# Patient Record
Sex: Female | Born: 1935 | Race: Black or African American | Hispanic: No | Smoking: Current every day smoker
Health system: Southern US, Community
[De-identification: ages and names within clinical notes are randomized; demographics above are authoritative.]

## PROBLEM LIST (undated history)

## (undated) DIAGNOSIS — F419 Anxiety disorder, unspecified: Secondary | ICD-10-CM

## (undated) DIAGNOSIS — E119 Type 2 diabetes mellitus without complications: Secondary | ICD-10-CM

## (undated) DIAGNOSIS — I10 Essential (primary) hypertension: Secondary | ICD-10-CM

## (undated) DIAGNOSIS — F4541 Pain disorder exclusively related to psychological factors: Secondary | ICD-10-CM

## (undated) DIAGNOSIS — I509 Heart failure, unspecified: Secondary | ICD-10-CM

## (undated) DIAGNOSIS — M1990 Unspecified osteoarthritis, unspecified site: Secondary | ICD-10-CM

## (undated) DIAGNOSIS — N189 Chronic kidney disease, unspecified: Secondary | ICD-10-CM

## (undated) DIAGNOSIS — F0390 Unspecified dementia without behavioral disturbance: Secondary | ICD-10-CM

## (undated) HISTORY — DX: Unspecified osteoarthritis, unspecified site: M19.90

## (undated) HISTORY — DX: Pain disorder exclusively related to psychological factors: F45.41

## (undated) HISTORY — DX: Heart failure, unspecified: I50.9

## (undated) HISTORY — DX: Essential (primary) hypertension: I10

## (undated) HISTORY — DX: Anxiety disorder, unspecified: F41.9

## (undated) HISTORY — DX: Type 2 diabetes mellitus without complications: E11.9

## (undated) HISTORY — DX: Chronic kidney disease, unspecified: N18.9

---

## 2004-07-01 ENCOUNTER — Ambulatory Visit: Payer: Self-pay | Admitting: Internal Medicine

## 2005-07-06 ENCOUNTER — Ambulatory Visit: Payer: Self-pay | Admitting: Internal Medicine

## 2006-08-18 ENCOUNTER — Ambulatory Visit: Payer: Self-pay | Admitting: Internal Medicine

## 2006-12-12 ENCOUNTER — Ambulatory Visit: Payer: Self-pay | Admitting: Gastroenterology

## 2007-11-20 ENCOUNTER — Ambulatory Visit: Payer: Self-pay | Admitting: Internal Medicine

## 2008-12-18 ENCOUNTER — Ambulatory Visit: Payer: Self-pay | Admitting: Internal Medicine

## 2009-02-05 ENCOUNTER — Ambulatory Visit: Payer: Self-pay | Admitting: Internal Medicine

## 2010-02-09 ENCOUNTER — Ambulatory Visit: Payer: Self-pay | Admitting: Internal Medicine

## 2010-02-12 ENCOUNTER — Ambulatory Visit: Payer: Self-pay | Admitting: Internal Medicine

## 2011-03-10 ENCOUNTER — Ambulatory Visit: Payer: Self-pay | Admitting: Internal Medicine

## 2012-03-16 ENCOUNTER — Ambulatory Visit: Payer: Self-pay | Admitting: Internal Medicine

## 2013-04-27 ENCOUNTER — Inpatient Hospital Stay: Payer: Self-pay | Admitting: Internal Medicine

## 2013-04-27 LAB — CBC
HCT: 41.3 % (ref 35.0–47.0)
HGB: 12.9 g/dL (ref 12.0–16.0)
MCH: 25.5 pg — ABNORMAL LOW (ref 26.0–34.0)
MCHC: 31.4 g/dL — ABNORMAL LOW (ref 32.0–36.0)
MCV: 81 fL (ref 80–100)
Platelet: 240 10*3/uL (ref 150–440)
RBC: 5.08 10*6/uL (ref 3.80–5.20)
RDW: 16.7 % — ABNORMAL HIGH (ref 11.5–14.5)
WBC: 4.3 10*3/uL (ref 3.6–11.0)

## 2013-04-27 LAB — BASIC METABOLIC PANEL
Anion Gap: 7 (ref 7–16)
BUN: 15 mg/dL (ref 7–18)
CALCIUM: 8.5 mg/dL (ref 8.5–10.1)
Chloride: 106 mmol/L (ref 98–107)
Co2: 26 mmol/L (ref 21–32)
Creatinine: 0.93 mg/dL (ref 0.60–1.30)
EGFR (African American): >60
EGFR (Non-African Amer.): 57 — ABNORMAL LOW
Glucose: 183 mg/dL — ABNORMAL HIGH (ref 65–99)
Osmolality: 283 (ref 275–301)
Potassium: 3.9 mmol/L (ref 3.5–5.1)
Sodium: 139 mmol/L (ref 136–145)

## 2013-04-27 LAB — CK TOTAL AND CKMB (NOT AT ARMC)
CK, TOTAL: 208 U/L — AB
CK, TOTAL: 203 U/L — AB
CK-MB: 5.7 ng/mL — ABNORMAL HIGH (ref 0.5–3.6)
CK-MB: 5.9 ng/mL — ABNORMAL HIGH (ref 0.5–3.6)

## 2013-04-27 LAB — URINALYSIS, COMPLETE
Bacteria: NONE SEEN
Bilirubin,UR: NEGATIVE
Blood: NEGATIVE
Glucose,UR: 50 mg/dL (ref 0–75)
KETONE: NEGATIVE
LEUKOCYTE ESTERASE: NEGATIVE
Nitrite: NEGATIVE
Ph: 6 (ref 4.5–8.0)
RBC,UR: 1 /HPF (ref 0–5)
Specific Gravity: 1.015 (ref 1.003–1.030)
Squamous Epithelial: 1

## 2013-04-27 LAB — TROPONIN I
TROPONIN-I: 0.02 ng/mL
TROPONIN-I: 0.2 ng/mL — AB
Troponin-I: 0.09 ng/mL — ABNORMAL HIGH

## 2013-04-27 LAB — PRO B NATRIURETIC PEPTIDE: B-Type Natriuretic Peptide: 6393 pg/mL — ABNORMAL HIGH (ref 0–450)

## 2013-04-28 LAB — LIPID PANEL
Cholesterol: 139 mg/dL (ref 0–200)
HDL Cholesterol: 52 mg/dL (ref 40–60)
Ldl Cholesterol, Calc: 74 mg/dL (ref 0–100)
TRIGLYCERIDES: 67 mg/dL (ref 0–200)
VLDL CHOLESTEROL, CALC: 13 mg/dL (ref 5–40)

## 2013-04-28 LAB — COMPREHENSIVE METABOLIC PANEL
ALT: 32 U/L (ref 12–78)
Albumin: 2.9 g/dL — ABNORMAL LOW (ref 3.4–5.0)
Alkaline Phosphatase: 95 U/L
Anion Gap: 9 (ref 7–16)
BILIRUBIN TOTAL: 0.6 mg/dL (ref 0.2–1.0)
BUN: 13 mg/dL (ref 7–18)
Calcium, Total: 9 mg/dL (ref 8.5–10.1)
Chloride: 102 mmol/L (ref 98–107)
Co2: 27 mmol/L (ref 21–32)
Creatinine: 1.03 mg/dL (ref 0.60–1.30)
EGFR (African American): 58 — ABNORMAL LOW
EGFR (Non-African Amer.): 50 — ABNORMAL LOW
Glucose: 110 mg/dL — ABNORMAL HIGH (ref 65–99)
Osmolality: 276 (ref 275–301)
Potassium: 3 mmol/L — ABNORMAL LOW (ref 3.5–5.1)
SGOT(AST): 38 U/L — ABNORMAL HIGH (ref 15–37)
Sodium: 138 mmol/L (ref 136–145)
TOTAL PROTEIN: 6.5 g/dL (ref 6.4–8.2)

## 2013-04-28 LAB — CK-MB
CK-MB: 3.1 ng/mL (ref 0.5–3.6)
CK-MB: 2.4 ng/mL (ref 0.5–3.6)

## 2013-04-28 LAB — TROPONIN I: Troponin-I: 0.34 ng/mL — ABNORMAL HIGH

## 2013-04-28 LAB — TSH: THYROID STIMULATING HORM: 3.66 u[IU]/mL

## 2013-04-28 LAB — HEMOGLOBIN A1C: HEMOGLOBIN A1C: 7.1 % — AB (ref 4.2–6.3)

## 2013-04-28 LAB — MAGNESIUM: Magnesium: 1.3 mg/dL — ABNORMAL LOW

## 2013-04-29 LAB — CBC WITH DIFFERENTIAL/PLATELET
BASOS ABS: 0 10*3/uL (ref 0.0–0.1)
Basophil %: 1 %
EOS ABS: 0.1 10*3/uL (ref 0.0–0.7)
EOS PCT: 1.4 %
HCT: 42.3 % (ref 35.0–47.0)
HGB: 13.4 g/dL (ref 12.0–16.0)
LYMPHS ABS: 1.5 10*3/uL (ref 1.0–3.6)
LYMPHS PCT: 32.5 %
MCH: 25.3 pg — ABNORMAL LOW (ref 26.0–34.0)
MCHC: 31.6 g/dL — ABNORMAL LOW (ref 32.0–36.0)
MCV: 80 fL (ref 80–100)
Monocyte #: 0.5 x10 3/mm (ref 0.2–0.9)
Monocyte %: 11.7 %
Neutrophil #: 2.5 10*3/uL (ref 1.4–6.5)
Neutrophil %: 53.4 %
Platelet: 251 10*3/uL (ref 150–440)
RBC: 5.29 10*6/uL — ABNORMAL HIGH (ref 3.80–5.20)
RDW: 16.4 % — ABNORMAL HIGH (ref 11.5–14.5)
WBC: 4.6 10*3/uL (ref 3.6–11.0)

## 2013-04-29 LAB — COMPREHENSIVE METABOLIC PANEL
AST: 36 U/L (ref 15–37)
Albumin: 3.3 g/dL — ABNORMAL LOW (ref 3.4–5.0)
Alkaline Phosphatase: 95 U/L
Anion Gap: 6 — ABNORMAL LOW (ref 7–16)
BUN: 21 mg/dL — ABNORMAL HIGH (ref 7–18)
Bilirubin,Total: 0.6 mg/dL (ref 0.2–1.0)
CHLORIDE: 103 mmol/L (ref 98–107)
CREATININE: 1.25 mg/dL (ref 0.60–1.30)
Calcium, Total: 9.2 mg/dL (ref 8.5–10.1)
Co2: 29 mmol/L (ref 21–32)
EGFR (African American): 46 — ABNORMAL LOW
EGFR (Non-African Amer.): 40 — ABNORMAL LOW
GLUCOSE: 165 mg/dL — AB (ref 65–99)
OSMOLALITY: 282 (ref 275–301)
Potassium: 4.1 mmol/L (ref 3.5–5.1)
SGPT (ALT): 33 U/L (ref 12–78)
SODIUM: 138 mmol/L (ref 136–145)
Total Protein: 7 g/dL (ref 6.4–8.2)

## 2013-04-30 LAB — BASIC METABOLIC PANEL
ANION GAP: 5 — AB (ref 7–16)
BUN: 23 mg/dL — AB (ref 7–18)
CALCIUM: 8.9 mg/dL (ref 8.5–10.1)
CHLORIDE: 104 mmol/L (ref 98–107)
CREATININE: 1.24 mg/dL (ref 0.60–1.30)
Co2: 29 mmol/L (ref 21–32)
EGFR (African American): 47 — ABNORMAL LOW
GFR CALC NON AF AMER: 40 — AB
Glucose: 92 mg/dL (ref 65–99)
Osmolality: 279 (ref 275–301)
POTASSIUM: 4.5 mmol/L (ref 3.5–5.1)
SODIUM: 138 mmol/L (ref 136–145)

## 2013-05-03 LAB — PLATELET COUNT: Platelet: 257 10*3/uL (ref 150–440)

## 2013-11-26 ENCOUNTER — Observation Stay: Payer: Self-pay | Admitting: Internal Medicine

## 2013-11-26 LAB — COMPREHENSIVE METABOLIC PANEL
ALBUMIN: 3.5 g/dL (ref 3.4–5.0)
ALT: 11 U/L — AB
Alkaline Phosphatase: 77 U/L
Anion Gap: 8 (ref 7–16)
BUN: 26 mg/dL — ABNORMAL HIGH (ref 7–18)
Bilirubin,Total: 0.5 mg/dL (ref 0.2–1.0)
CO2: 26 mmol/L (ref 21–32)
Calcium, Total: 9 mg/dL (ref 8.5–10.1)
Chloride: 106 mmol/L (ref 98–107)
Creatinine: 1.22 mg/dL (ref 0.60–1.30)
EGFR (African American): 47 — ABNORMAL LOW
EGFR (Non-African Amer.): 41 — ABNORMAL LOW
Glucose: 110 mg/dL — ABNORMAL HIGH (ref 65–99)
Osmolality: 285 (ref 275–301)
Potassium: 4.1 mmol/L (ref 3.5–5.1)
SGOT(AST): 28 U/L (ref 15–37)
Sodium: 140 mmol/L (ref 136–145)
Total Protein: 7.2 g/dL (ref 6.4–8.2)

## 2013-11-26 LAB — LIPID PANEL
Cholesterol: 102 mg/dL (ref 0–200)
HDL Cholesterol: 47 mg/dL (ref 40–60)
Ldl Cholesterol, Calc: 45 mg/dL (ref 0–100)
Triglycerides: 51 mg/dL (ref 0–200)
VLDL Cholesterol, Calc: 10 mg/dL (ref 5–40)

## 2013-11-26 LAB — TROPONIN I: Troponin-I: <0.02 ng/mL

## 2013-11-26 LAB — URINALYSIS, COMPLETE
Bacteria: NONE SEEN
Bilirubin,UR: NEGATIVE
Blood: NEGATIVE
GLUCOSE, UR: NEGATIVE mg/dL (ref 0–75)
Ketone: NEGATIVE
LEUKOCYTE ESTERASE: NEGATIVE
Nitrite: NEGATIVE
PH: 5 (ref 4.5–8.0)
Protein: 25
RBC,UR: 1 /HPF (ref 0–5)
Specific Gravity: 1.025 (ref 1.003–1.030)
WBC UR: 1 /HPF (ref 0–5)

## 2013-11-26 LAB — CK TOTAL AND CKMB (NOT AT ARMC)
CK, TOTAL: 112 U/L
CK, Total: 141 U/L
CK, Total: 117 U/L
CK-MB: 2.3 ng/mL (ref 0.5–3.6)
CK-MB: 2.1 ng/mL (ref 0.5–3.6)
CK-MB: 2 ng/mL (ref 0.5–3.6)

## 2013-11-26 LAB — CBC
HCT: 38 % (ref 35.0–47.0)
HGB: 11.9 g/dL — AB (ref 12.0–16.0)
MCH: 27.6 pg (ref 26.0–34.0)
MCHC: 31.4 g/dL — ABNORMAL LOW (ref 32.0–36.0)
MCV: 88 fL (ref 80–100)
PLATELETS: 204 10*3/uL (ref 150–440)
RBC: 4.32 10*6/uL (ref 3.80–5.20)
RDW: 14.8 % — ABNORMAL HIGH (ref 11.5–14.5)
WBC: 4.4 10*3/uL (ref 3.6–11.0)

## 2013-12-27 ENCOUNTER — Ambulatory Visit: Payer: Self-pay | Admitting: Neurology

## 2014-06-29 NOTE — Consult Note (Signed)
Chief Complaint:  Subjective/Chief Complaint SOB much improved. Denies cp no syncope no edema.   VITAL SIGNS/ANCILLARY NOTES: **Vital Signs.:   21-Feb-15 11:50  Vital Signs Type Routine  Temperature Temperature (F) 97.8  Celsius 36.5  Temperature Source oral  Pulse Pulse 81  Respirations Respirations 18  Systolic BP Systolic BP 638  Diastolic BP (mmHg) Diastolic BP (mmHg) 81  Mean BP 98  Pulse Ox % Pulse Ox % 95  Pulse Ox Activity Level  At rest  Oxygen Delivery Room Air/ 21 %  *Intake and Output.:   Daily 21-Feb-15 07:00  Grand Totals Intake:  240 Output:  150    Net:  90 24 Hr.:  90  Oral Intake      In:  240  Urine ml     Out:  150  Length of Stay Totals Intake:  240 Output:  150    Net:  90   Brief Assessment:  GEN well developed, well nourished, no acute distress   Cardiac Regular  murmur present  -- LE edema   Respiratory normal resp effort   Gastrointestinal Normal   Gastrointestinal details normal Soft  Nontender  Nondistended   EXTR negative cyanosis/clubbing, negative edema   Lab Results: Thyroid:  21-Feb-15 04:24   Thyroid Stimulating Hormone 3.66 (0.45-4.50 (International Unit)  ----------------------- Pregnant patients have  different reference  ranges for TSH:  - - - - - - - - - -  Pregnant, first trimetser:  0.36 - 2.50 uIU/mL)  Hepatic:  21-Feb-15 04:24   Bilirubin, Total 0.6  Alkaline Phosphatase 95 (45-117 NOTE: New Reference Range 01/26/13)  SGPT (ALT) 32  SGOT (AST)  38  Total Protein, Serum 6.5  Albumin, Serum  2.9  Routine Chem:  21-Feb-15 04:24   Magnesium, Serum  1.3 (1.8-2.4 THERAPEUTIC RANGE: 4-7 mg/dL TOXIC: > 10 mg/dL  -----------------------)  Result Comment TROPONIN - RESULTS VERIFIED BY REPEAT TESTING.  - RESULT CALLED PREVIOUSLY 04/27/13 AT  - 1433 BY QSD-DAS  Result(s) reported on 28 Apr 2013 at 07:45AM.  Glucose, Serum  110  BUN 13  Creatinine (comp) 1.03  Sodium, Serum 138  Potassium, Serum  3.0   Chloride, Serum 102  CO2, Serum 27  Calcium (Total), Serum 9.0  Osmolality (calc) 276  eGFR (African American)  58  eGFR (Non-African American)  50 (eGFR values <55m/min/1.73 m2 may be an indication of chronic kidney disease (CKD). Calculated eGFR is useful in patients with stable renal function. The eGFR calculation will not be reliable in acutely ill patients when serum creatinine is changing rapidly. It is not useful in  patients on dialysis. The eGFR calculation may not be applicable to patients at the low and high extremes of body sizes, pregnant women, and vegetarians.)  Anion Gap 9  Hemoglobin A1c ([facility])  7.1 (The American Diabetes Association recommends that a primary goal of therapy should be <7% and that physicians should reevaluate the treatment regimen in patients with HbA1c values consistently >8%.)  Cholesterol, Serum 139  Triglycerides, Serum 67  HDL (INHOUSE) 52  VLDL Cholesterol Calculated 13  LDL Cholesterol Calculated 74 (Result(s) reported on 28 Apr 2013 at 07:20AM.)  Cardiac:  21-Feb-15 04:24   CPK-MB, Serum 3.1 (Result(s) reported on 28 Apr 2013 at 08:21AM.)  Troponin I  0.34 (0.00-0.05 0.05 ng/mL or less: NEGATIVE  Repeat testing in 3-6 hrs  if clinically indicated. >0.05 ng/mL: POTENTIAL  MYOCARDIAL INJURY. Repeat  testing in 3-6 hrs if  clinically indicated. NOTE: An increase or  decrease  of 30% or more on serial  testing suggests a  clinically important change)    12:10   CPK-MB, Serum 2.4 (Result(s) reported on 28 Apr 2013 at 01:12PM.)   Radiology Results: XRay:    20-Feb-15 10:07, Chest PA and Lateral  Chest PA and Lateral   REASON FOR EXAM:    shob  COMMENTS:       PROCEDURE: DXR - DXR CHEST PA (OR AP) AND LATERAL  - Apr 27 2013 10:07AM     CLINICAL DATA:  Short of breath.  Smoking history.    EXAM:  CHEST  2 VIEW    COMPARISON:  None.    FINDINGS:  The cardiac silhouette is enlarged consistent with cardiomegaly  and/or  pericardial fluid. The aorta shows calcification and  unfolding. There is pulmonary venous hypertension with interstitial  pulmonary edema. There small pleural effusions with mild basilar  volume loss. No focal pulmonary process. Ordinary degenerative  changes affect the spine.     IMPRESSION:  Congestive heart failure as outlined above.      Electronically Signed    By: Nelson Chimes M.D.    On: 04/27/2013 10:09         Verified By: Jules Schick, M.D.,  Cardiology:    20-Feb-15 14:15, Echo Doppler  Echo Doppler   REASON FOR EXAM:      COMMENTS:       PROCEDURE: [facility] - ECHO DOPPLER COMPLETE(TRANSTHOR)  - Apr 27 2013  2:15PM     RESULT: Echocardiogram Report    Patient Name:   Jamie Glass Date of Exam: 04/27/2013  Medical Rec #:  [id number]           Custom1:  Date of Birth:  May 23, 1927         Height:       66.0 in  Patient Age:    81 years         Weight:       128.0 lb  Patient Gender: F                BSA:          1.65 m??    Indications: CHF  Sonographer:    Sherrie Sport RDCS  Referring Phys: MODY, SITAL, P    Sonographer Comments: [facility]    Summary:   1. Left ventricular ejection fraction, by visual estimation, is 40 to   45%.   2. Mildly to moderately decreased global left ventricular systolic   function.   3. Severe tricuspid regurgitation.   4. Moderate mitral valve regurgitation.   5. Mildly elevated pulmonary artery systolic pressure.   6. Severely increased left ventricular posterior wall thickness.   7. Mod/Severe ant/apical hypo.  2D AND M-MODE MEASUREMENTS (normal ranges within parentheses):  Left Ventricle:          Normal  IVSd (2D):      1.22 cm (0.7-1.1)  LVPWd (2D):     1.51 cm (0.7-1.1) Aorta/LA:                  Normal  LVIDd (2D):     4.34 cm (3.4-5.7) Aortic Root (2D): 2.40 cm (2.4-3.7)  LVIDs (2D):     3.36 cm           Left Atrium (2D): 3.60 cm (1.9-4.0)  LV FS (2D):     22.6 %   (>25%)  LV EF (2D):     45.7 %   (>  50%)                                     Right Ventricle:                                    RVd (2D):        0.76 cm  LV SYSTOLIC FUNCTION BY 2D PLANIMETRY (MOD):  EF-A4C View: 38.9 % EF-A2C View: 45.4 % EF-Biplane: 80.8 %  LV DIASTOLIC FUNCTION:  MV Peak E: 1.20 m/s E/e' Ratio: 29.30  MV Peak A: 1.21 m/s Decel Time: 187 msec  E/A Ratio: 0.99  SPECTRAL DOPPLER ANALYSIS (where applicable):  Mitral Valve:  MV P1/2 Time: 54.23 msec  MV Area, PHT: 4.06 cm??  Aortic Valve: AoV Max Vel: 1.57 m/s AoV Peak PG: 9.9 mmHg AoV Mean PG:  LVOT Vmax: 1.07 m/s LVOT VTI:  LVOT Diameter: 2.00 cm  AoV Area, Vmax: 2.14 cm?? AoV Area, VTI:  AoV Area, Vmn:  Tricuspid Valve and PA/RV Systolic Pressure: TR Max Velocity: 3.18 m/s RA   Pressure: 5 mmHg RVSP/PASP: 45.5 mmHg  Pulmonic Valve:  PV Max Velocity: 0.92 m/s PV Max PG: 3.4 mmHg PV Mean PG:  Pulmonic Insufficiency:  PI EDV: 1.21 m/s PADP: 10.9 mmHg    PHYSICIAN INTERPRETATION:  Left Ventricle: The left ventricular internal cavity size was normal. LV   septal wall thickness was normal. LV posterior wall thickness was   severely increased. Global LV systolic function was mildly to moderately   decreased. Left ventricularejection fraction, by visual estimation, is   40 to 45%.  Right Ventricle: The right ventricular size is mildly enlarged. Global RV   systolic function is normal.  Left Atrium: The left atrium is normal in size.  Right Atrium: The right atrium is normal in size.  Pericardium: There is no evidence of pericardial effusion.  Mitral Valve: The mitral valve is normal in structure. Moderate mitral   valve regurgitation is seen.  Tricuspid Valve: The tricuspid valve is normal. Severe tricuspid   regurgitation is visualized. The tricuspid regurgitant velocity is 3.18   m/s, and with an assumed right atrial pressure of 5 mmHg, the estimated     right ventricular systolic pressure is mildly elevated at 45.5 mmHg.  Aortic Valve: The aortic valve is normal.  Pulmonic  Valve: The pulmonic valve is normal.    [facility] MD  Electronically signed by Volente MD  Signature Date/Time: 04/27/2013/3:21:39 PM    *** Final ***    IMPRESSION: .        Verified By: Yolonda Kida, M.D., MD   Assessment/Plan:  Assessment/Plan:  Assessment IMP SOB CHF CM HTN Hyperlipidemia Probable CAD .   Plan PLAN Tele 02 IV Lasix bid ACE for bp/cm/chf B-blockers Statin for lipids Increase activity Hopefully d/c home soon Consider cath possibly inpt/outpt?   Electronic Signatures: Lujean Amel D (MD)  (Signed 21-Feb-15 23:56)  Authored: Chief Complaint, VITAL SIGNS/ANCILLARY NOTES, Brief Assessment, Lab Results, Radiology Results, Assessment/Plan   Last Updated: 21-Feb-15 23:56 by Lujean Amel D (MD)

## 2014-06-29 NOTE — Consult Note (Signed)
Brief Consult Note: Diagnosis: Cognitive disorder NOS.   Patient was seen by consultant.   Consult note dictated.   Recommend further assessment or treatment.   Orders entered.   Comments: [name] has no psychiatric past. She has not been compliant with treatment at home and comes for exacerbation of CAD and accelerated HTN. She has been agitated at night and code 300 was called for the previous 2 nights. She is being referred to Geriatric psychiatry unit.   She was given Zyprexa zydis last night. She slept through the night and is somnolent during the day. She wakes up for meals. She is still pleasantly confused but interacted appropriately with her family today. She has no complaints and feels "good". Sitter at bedside.     MSE: Somnolent but arousable. Continues to be confused. No behavioral problems. She maintains some eye contect. Speech is soft. Mood is fine. Thought process is slow. She is not suicidal or homicidal. There are paranoid delusions. No evidence of hallucinations. Cognition impaired. Unable to participate in cognitive part of exam. Insight and judgment extremely poor.   PLAN: 1. The patient is on IVC.   2. She does not have the capacity to make decisions about disposition.   3. She is referred to geropsychiatry unit.   4. We continue Zyuprexa zydis 5 mg at night and as needed for agitation.   5. I will follow along.  Electronic Signatures: Kristine LineaPucilowska, Cherrill Scrima (MD)  (Signed 25-Feb-15 22:06)  Authored: Brief Consult Note   Last Updated: 25-Feb-15 22:06 by Kristine LineaPucilowska, Kourtland Coopman (MD)

## 2014-06-29 NOTE — Discharge Summary (Signed)
PATIENT NAME:  Jamie Glass, Mida K MR#:  [id number] DATE OF BIRTH:  [birth year]  DATE OF ADMISSION:  11/26/2013 DATE OF DISCHARGE:    FINAL DIAGNOSES:  1. Accelerated hypertension.  2.  Decreased grip left hand.  3.  Dementia with behavioral disturbance.    4.  Diabetes mellitus.  5.  History of chronic left-sided systolic congestive heart failure.  6.  Medical noncompliance.   HISTORY AND PHYSICAL: Please see dictated admission history and physical.   HOSPITAL COURSE: The patient was admitted with accelerated hypertension and question of new onset of weakness in the left arm. Very difficult to verify the timing of this due to the patient's dementia. The family members are not able to tell us either. It did not appear to involve the face or leg. CT scan was performed which was negative. MRI of the brain was performed which revealed no acute event. Carotid Dopplers were performed which did not reveal occlusion.   Lopressor was introduced, and the patient was continued on home medications and her blood pressure was markedly improved with this combination. She was up walking in the hallways spontaneously on multiple times, and family members were concerned that she appeared to be getting more irritated and agitated with being in the hospital. They desired to take her home. We talked about setting up home health physical therapy or nursing, however they feel like she is doing well enough at home that these things would not be necessary and that they might actually make her feel more agitated and so they declined these modalities. At this point we will anticipate her returning home with her physical activity to be up as tolerated. She should check blood sugars daily and record these. Encouraged her to check blood pressures as well if able, and we will have her follow up with her primary care physician during the next 1-2 weeks and have them follow up with neurology in the next 2-3 weeks as well. ACE  inhibitors and ARBs will not be ordered due to the patient's history of worsening renal function when on these via a family member's report. She should weigh herself daily and call for more than 2 pound gain in 1 day or 5 pounds in 1 week or increasing signs or symptoms of heart failure or any new neurologic deficits. At this point we will have her follow a regular diet given her overall decreased nutritional status although I have asked them to limit salt as able.   DISCHARGE MEDICATIONS:  1.  Imdur 30 mg p.o. daily.  2.  Pravastatin 20 mg p.o. at bedtime.   3. Aspirin 81 mg p.o. daily.  4.  Olanzapine 5 mg p.o. at bedtime.  5.  Glipizide XL 2.5 mg p.o. q.a.m.  6.  Lopressor 25 mg p.o. b.i.d.   CODE STATUS:  The patient is full code.     ____________________________ Lynnea FerrierBert J. Orie Cuttino III, MD bjk:[id number] D: 11/27/2013 13:03:00 ET T: 11/27/2013 13:19:38 ET JOB#: [id number]  cc: Lynnea FerrierBert J. Tashaun Obey III, MD, <Dictator> Daniel NonesBERT Zubayr Bednarczyk MD ELECTRONICALLY SIGNED 12/04/2013 7:20

## 2014-06-29 NOTE — H&P (Signed)
PATIENT NAME:  Jamie Glass, Jamie Glass MR#:  [id number] DATE OF BIRTH:  [birth year]  DATE OF ADMISSION:  04/27/2013  PRIMARY CARE PHYSICIAN: Yates DecampJohn Walker III, MD  CHIEF COMPLAINT: Short of breath.   HISTORY OF PRESENT ILLNESS: This is an 81 year old female with a history of hypertension, hyperlipidemia, and diabetes who presents with the above complaint. Over the past few weeks, the patient has had increasing shortness of breath. Her daughter noted today that she was very short of breath and had dyspnea on exertion so she brought her here for further evaluation. In the ER, chest x-ray was performed which shows pulmonary edema. She does have an elevated BNP. She says her shortness of breath and dyspnea on exertion have been persistent over the past few weeks, but it did get worse today. She denies any lower extremity edema, chest pain, nausea and vomiting. She had a headache this morning and some blurred vision. No polyphagia or polyuria. She has not been taking any of her medications for several months now.   REVIEW OF SYSTEMS: CONSTITUTIONAL: No fever. Positive fatigue and weakness. She has fallen a few times over the past few days. EYES: Positive blurred vision. No double vision, pain, or redness. ENT: No ear pain, hearing loss, seasonal allergies, postnasal drip, or sinus pain. RESPIRATORY: No cough, wheezing, or hemoptysis. Positive dyspnea. No history of COPD. CARDIOVASCULAR: No chest pain, palpitations, orthopnea. No edema. No arrhythmia. Positive dyspnea on exertion.  GASTROINTESTINAL: No nausea, vomiting, diarrhea, abdominal pain, melena, or ulcers. GENITOURINARY: No dysuria or hematuria.  ENDOCRINE: No polyuria or polydipsia.  HEME AND LYMPH: No anemia or easy bruising. SKIN: No rash or lesions.  MUSCULOSKELETAL: No pain in the shoulders, joints, or knees. She has fallen a few times. NEUROLOGIC: No history of CVA, TIA, or seizures. PSYCHIATRIC: No history of anxiety or depression.  PAST  MEDICAL HISTORY:  1.  Hypertensive heart disease.  2.  Osteoarthritis. 3.  Diabetes.  4.  Hyperlipidemia. 5.  Proteinuria. 6.  Tricuspid regurgitation.  7.  Colonic polyps.   PAST SURGICAL HISTORY:  1.  Lumbar laminectomy. 2.  Complete hysterectomy.  MEDICATIONS: The patient is not taking any medications.   ALLERGIES: SULFA CAUSES A RASH.   FAMILY HISTORY: Positive for diabetes.   SOCIAL HISTORY: No tobacco, alcohol, or drug use. She was a former smoker, quit several years ago.   PHYSICAL EXAMINATION: VITAL SIGNS: Temperature 97.9, pulse 90, respirations 18, blood pressure 214/115 initially and now is 186/97, and 97% on room air.  GENERAL: The patient is alert, oriented. She is in some distress if she lies flat.  HEENT: Head is atraumatic. Pupils are round and reactive. Sclerae anicteric. Mucous membranes are dry. Oropharynx is clear.  NECK: Supple. There is JVD up to the level of the jaw. No enlarged thyroid.  CARDIOVASCULAR: Regular rate and rhythm. No murmurs, gallops, or rubs. PMI is laterally displaced.  LUNGS: Diffuse crackles about one-third of the way bilaterally and symmetrically. No dullness to percussion. Normal chest expansion. No rales or rhonchi are heard.  ABDOMEN: Bowel sounds are positive, nontender, nondistended. No hepatosplenomegaly. No rebound or guarding.  EXTREMITIES: No clubbing, cyanosis or edema.  NEUROLOGIC: Cranial nerves II through XII are intact. There are no focal deficits.  SKIN: Without rash or lesions.  BACK: No CVA or vertebral tenderness.   DIAGNOSTIC DATA: Laboratory: White blood cells 4.3, hemoglobin 13, hematocrit 42, platelets 240. Sodium 139, potassium 3.9, chloride 106, bicarb 26, BUN 15, creatinine 0.93, glucose 183, calcium 8.5, anion  gap 7. BNP [id number].   Troponin 0.02. Urinalysis shows no LCE or nitrites.   Chest x-ray shows bilateral pulmonary edema.   EKG showed normal sinus rhythm with left atrial enlargement and LVH.     ASSESSMENT AND PLAN: This is an 81 year old female with a history of diabetes, hypertension, hyperlipidemia, and not taking any medications for several weeks who initially presented with shortness of breath and malignant hypertension.  1.  Acute congestive heart failure. The patient was noted to have pulmonary edema on chest x-ray, elevated BNP, crackles on examination, as well JVD, all consistent with acute congestive heart failure, possibly diastolic or systolic in nature. I will continue Lasix diuresis 40 IV q. 8 hours, follow I's and O's and daily weights. Cardiology has been consulted. Echocardiogram has been ordered along with a TSH.  2.  Malignant hypertension. The patient does have baseline hypertension, not taking any medications. I suspect this is a combination of underlying hypertension as well as congestive heart failure. Her blood pressure has improved. We will continue with hydralazine p.r.n. and nitroglycerin. I have started metoprolol 50 b.i.d. in addition to lisinopril 20 mg b.i.d. Her blood pressure will need to be monitored carefully as this could also be the etiology of her acute congestive heart failure.  3.  Diabetes. For now put the patient on sliding scale insulin. It is noted that she was on metformin and p.o. glitazone as an outpatient several months ago which we may want to restart prior to discharge. I have ordered a Hemoglobin A1c. 4.  Hyperlipidemia. The patient was on Pravastatin in the past. I have ordered lipid panel and will continue with pravastatin 20 mg at bedtime.  5.  Falls. The patient has had a couple of falls just due to balance issues. She does not have any focal neurological deficits on exam. We will go ahead and consult physical therapy as well as case management for her disposition planning.   CODE STATUS: DNR.   TIME SPENT: Approximately 45 minutes.    ____________________________ Janyth Contes. Juliene Pina, MD spm:sb D: 04/27/2013 11:12:49 ET T: 04/27/2013  14:48:01 ET JOB#: [id number]  cc: Vincente Asbridge P. Juliene Pina, MD, <Dictator> John B. Danne Harbor, MD Tashara Suder P Latiesha Harada MD ELECTRONICALLY SIGNED 04/27/2013 16:30

## 2014-06-29 NOTE — Consult Note (Signed)
Chief Complaint:  Subjective/Chief Complaint Dementia  . Ambulating well with PT.SOB much improved. Denies cp no syncope no edema.   VITAL SIGNS/ANCILLARY NOTES: **Vital Signs.:   22-Feb-15 11:56  Vital Signs Type Routine  Temperature Temperature (F) 97.7  Celsius 36.5  Temperature Source oral  Pulse Pulse 73  Respirations Respirations 18  Systolic BP Systolic BP 956  Diastolic BP (mmHg) Diastolic BP (mmHg) 79  Mean BP 98  Pulse Ox % Pulse Ox % 98  Pulse Ox Activity Level  At rest  Oxygen Delivery Room Air/ 21 %  *Intake and Output.:   22-Feb-15 11:58  Grand Totals Intake:  240 Output:      Net:  240 24 Hr.:  480  Oral Intake      In:  240  Percentage of Meal Eaten  75   Brief Assessment:  GEN well developed, well nourished, no acute distress   Cardiac Regular  murmur present  -- LE edema   Respiratory normal resp effort   Gastrointestinal Normal   Gastrointestinal details normal Soft  Nontender  Nondistended   EXTR negative cyanosis/clubbing, negative edema   Lab Results: Thyroid:  21-Feb-15 04:24   Thyroid Stimulating Hormone 3.66 (0.45-4.50 (International Unit)  ----------------------- Pregnant patients have  different reference  ranges for TSH:  - - - - - - - - - -  Pregnant, first trimetser:  0.36 - 2.50 uIU/mL)  Hepatic:  21-Feb-15 04:24   Bilirubin, Total 0.6  Alkaline Phosphatase 95 (45-117 NOTE: New Reference Range 01/26/13)  SGPT (ALT) 32  SGOT (AST)  38  Total Protein, Serum 6.5  Albumin, Serum  2.9  Routine Chem:  21-Feb-15 04:24   Glucose, Serum  110  BUN 13  Creatinine (comp) 1.03  Potassium, Serum  3.0  Chloride, Serum 102  CO2, Serum 27  Calcium (Total), Serum 9.0  Osmolality (calc) 276  eGFR (African American)  58  eGFR (Non-African American)  50 (eGFR values <44m/min/1.73 m2 may be an indication of chronic kidney disease (CKD). Calculated eGFR is useful in patients with stable renal function. The eGFR calculation will  not be reliable in acutely ill patients when serum creatinine is changing rapidly. It is not useful in  patients on dialysis. The eGFR calculation may not be applicable to patients at the low and high extremes of body sizes, pregnant women, and vegetarians.)  Anion Gap 9  Magnesium, Serum  1.3 (1.8-2.4 THERAPEUTIC RANGE: 4-7 mg/dL TOXIC: > 10 mg/dL  -----------------------)  Result Comment TROPONIN - RESULTS VERIFIED BY REPEAT TESTING.  - RESULT CALLED PREVIOUSLY 04/27/13 AT  - 1433 BY QSD-DAS  Result(s) reported on 28 Apr 2013 at 07:45AM.  Hemoglobin A1c ([facility])  7.1 (The American Diabetes Association recommends that a primary goal of therapy should be <7% and that physicians should reevaluate the treatment regimen in patients with HbA1c values consistently >8%.)  Cholesterol, Serum 139  Triglycerides, Serum 67  HDL (INHOUSE) 52  VLDL Cholesterol Calculated 13  LDL Cholesterol Calculated 74 (Result(s) reported on 28 Apr 2013 at 07:20AM.)  Cardiac:  21-Feb-15 04:24   CPK-MB, Serum 3.1 (Result(s) reported on 28 Apr 2013 at 08:21AM.)  Troponin I  0.34 (0.00-0.05 0.05 ng/mL or less: NEGATIVE  Repeat testing in 3-6 hrs  if clinically indicated. >0.05 ng/mL: POTENTIAL  MYOCARDIAL INJURY. Repeat  testing in 3-6 hrs if  clinically indicated. NOTE: An increase or decrease  of 30% or more on serial  testing suggests a  clinically important change)  12:10   CPK-MB, Serum 2.4 (Result(s) reported on 28 Apr 2013 at 01:12PM.)   Radiology Results: XRay:    20-Feb-15 10:07, Chest PA and Lateral  Chest PA and Lateral   REASON FOR EXAM:    shob  COMMENTS:       PROCEDURE: DXR - DXR CHEST PA (OR AP) AND LATERAL  - Apr 27 2013 10:07AM     CLINICAL DATA:  Short of breath.  Smoking history.    EXAM:  CHEST  2 VIEW    COMPARISON:  None.    FINDINGS:  The cardiac silhouette is enlarged consistent with cardiomegaly  and/or pericardial fluid. The aorta shows calcification  and  unfolding. There is pulmonary venous hypertension with interstitial  pulmonary edema. There small pleural effusions with mild basilar  volume loss. No focal pulmonary process. Ordinary degenerative  changes affect the spine.     IMPRESSION:  Congestive heart failure as outlined above.      Electronically Signed    By: Nelson Chimes M.D.    On: 04/27/2013 10:09         Verified By: Jules Schick, M.D.,  Cardiology:    20-Feb-15 14:15, Echo Doppler  Echo Doppler   REASON FOR EXAM:      COMMENTS:       PROCEDURE: [facility] - ECHO DOPPLER COMPLETE(TRANSTHOR)  - Apr 27 2013  2:15PM     RESULT: Echocardiogram Report    Patient Name:   Jamie Glass Date of Exam: 04/27/2013  Medical Rec #:  [id number]           Custom1:  Date of Birth:  1928-01-31         Height:       66.0 in  Patient Age:    81 years         Weight:       128.0 lb  Patient Gender: F                BSA:          1.65 m??    Indications: CHF  Sonographer:    Sherrie Sport RDCS  Referring Phys: MODY, SITAL, P    Sonographer Comments: [facility]    Summary:   1. Left ventricular ejection fraction, by visual estimation, is 40 to   45%.   2. Mildly to moderately decreased global left ventricular systolic   function.   3. Severe tricuspid regurgitation.   4. Moderate mitral valve regurgitation.   5. Mildly elevated pulmonary artery systolic pressure.   6. Severely increased left ventricular posterior wall thickness.   7. Mod/Severe ant/apical hypo.  2D AND M-MODE MEASUREMENTS (normal ranges within parentheses):  Left Ventricle:          Normal  IVSd (2D):      1.22 cm (0.7-1.1)  LVPWd (2D):     1.51 cm (0.7-1.1) Aorta/LA:                  Normal  LVIDd (2D):     4.34 cm (3.4-5.7) Aortic Root (2D): 2.40 cm (2.4-3.7)  LVIDs (2D):     3.36 cm           Left Atrium (2D): 3.60 cm (1.9-4.0)  LV FS (2D):     22.6 %   (>25%)  LV EF (2D):     45.7 %   (>50%)  Right Ventricle:                                     RVd (2D):        7.12 cm  LV SYSTOLIC FUNCTION BY 2D PLANIMETRY (MOD):  EF-A4C View: 38.9 % EF-A2C View: 45.4 % EF-Biplane: 19.7 %  LV DIASTOLIC FUNCTION:  MV Peak E: 1.20 m/s E/e' Ratio: 29.30  MV Peak A: 1.21 m/s Decel Time: 187 msec  E/A Ratio: 0.99  SPECTRAL DOPPLER ANALYSIS (where applicable):  Mitral Valve:  MV P1/2 Time: 54.23 msec  MV Area, PHT: 4.06 cm??  Aortic Valve: AoV Max Vel: 1.57 m/s AoV Peak PG: 9.9 mmHg AoV Mean PG:  LVOT Vmax: 1.07 m/s LVOT VTI:  LVOT Diameter: 2.00 cm  AoV Area, Vmax: 2.14 cm?? AoV Area, VTI:  AoV Area, Vmn:  Tricuspid Valve and PA/RV Systolic Pressure: TR Max Velocity: 3.18 m/s RA   Pressure: 5 mmHg RVSP/PASP: 45.5 mmHg  Pulmonic Valve:  PV Max Velocity: 0.92 m/s PV Max PG: 3.4 mmHg PV Mean PG:  Pulmonic Insufficiency:  PI EDV: 1.21 m/s PADP: 10.9 mmHg    PHYSICIAN INTERPRETATION:  Left Ventricle: The left ventricular internal cavity size was normal. LV   septal wall thickness was normal. LV posterior wall thickness was   severely increased. Global LV systolic function was mildly to moderately   decreased. Left ventricularejection fraction, by visual estimation, is   40 to 45%.  Right Ventricle: The right ventricular size is mildly enlarged. Global RV   systolic function is normal.  Left Atrium: The left atrium is normal in size.  Right Atrium: The right atrium is normal in size.  Pericardium: There is no evidence of pericardial effusion.  Mitral Valve: The mitral valve is normal in structure. Moderate mitral   valve regurgitation is seen.  Tricuspid Valve: The tricuspid valve is normal. Severe tricuspid   regurgitation is visualized. The tricuspid regurgitant velocity is 3.18   m/s, and with an assumed right atrial pressure of 5 mmHg, the estimated     right ventricular systolic pressure is mildly elevated at 45.5 mmHg.  Aortic Valve: The aortic valve is normal.  Pulmonic Valve: The pulmonic valve is normal.    [place] MD  Electronically signed by Hazel Crest MD  Signature Date/Time: 04/27/2013/3:21:39 PM    *** Final ***    IMPRESSION: .        Verified By: Yolonda Kida, M.D., MD   Assessment/Plan:  Assessment/Plan:  Assessment IMP Dementia SOB CHF CM HTN Hyperlipidemia Probable CAD .   Plan PLAN Tele Agree with PT/OT  Switch to PO lasix ACE for bp/cm/chf B-blockersfor Bp/ CHF/CM Statin for lipids Increase activity Hopefully d/c home soon Consider cath possibly inpt/outpt? Probably rec medical therapy if no symptoms   Electronic Signatures: Lujean Amel D (MD)  (Signed 22-Feb-15 17:54)  Authored: Chief Complaint, VITAL SIGNS/ANCILLARY NOTES, Brief Assessment, Lab Results, Radiology Results, Assessment/Plan   Last Updated: 22-Feb-15 17:54 by Yolonda Kida (MD)

## 2014-06-29 NOTE — Consult Note (Signed)
Brief Consult Note: Diagnosis: Cognitive disorder NOS.   Patient was seen by consultant.   Consult note dictated.   Recommend further assessment or treatment.   Comments: [name] has no psychiatric past. She has not been compliant with treatment at home and comes for exacerbation of CAD and accelerated HTN. She was confused and agitated last night and code 300 was called.   She is very pleasant and polite now. She denies any problems. She fell twice lately but reports no injuries. She has no idea why she is here. She feels we "invited her". She has never seen a psychiatrist "I am not crazy!"  PLAN: 1. Agree with Seroquel tonight to induce sleep.  2. Will folow along.  Electronic Signatures: Kristine LineaPucilowska, Alysia Scism (MD)  (Signed 23-Feb-15 19:31)  Authored: Brief Consult Note   Last Updated: 23-Feb-15 19:31 by Kristine LineaPucilowska, Mateus Rewerts (MD)

## 2014-06-29 NOTE — Consult Note (Signed)
Brief Consult Note: Diagnosis: Cognitive disorder NOS.   Patient was seen by consultant.   Consult note dictated.   Recommend further assessment or treatment.   Orders entered.   Comments: [name] has no psychiatric past. She has not been compliant with treatment at home and comes for exacerbation of CAD and accelerated HTN. She was confused and agitated last night again and code 300 was called twice. She is restless all day today. She is trying to go home and was able to get as far as the mall here. She is pleasantly confused and has her money ready to take us to dinner. Her son came by today and wanted to take her home. The attending physician does not believe she is safe at home.    MSE: Alert but pleasantly confused. She is oriented to person and place only. She is polite now but insists on going home. She is all dressed and ready. She found her shoe. She wears a hat. She denies any problems. She maintains good eye contect. Speech is soft. Mood is good. Affect slightly angry. Thought process is illogical. She is not suicidal or homicidal. There are paranoid delusions. No evidence of hallucinations now but she reportedly hallucinates at night. Cognition impaired. Unable to participate in cognitive part of exam. Insight and judgment extremely poor.   PLAN: 1. The patient does not have the capacity to make decisions about disposition.   2. We will initiate IVC and refer to geropsychiatry unit.   3. We start Zyprexa zydis 10 mg at night and as needed for agitation.   4. I will follow along.  Electronic Signatures: Kristine LineaPucilowska, Kailyn Vanderslice (MD)  (Signed 24-Feb-15 16:59)  Authored: Brief Consult Note   Last Updated: 24-Feb-15 16:59 by Kristine LineaPucilowska, Colston Pyle (MD)

## 2014-06-29 NOTE — Consult Note (Signed)
PATIENT NAME:  Jamie Glass, Jamie Glass MR#:  [id number] DATE OF BIRTH:  [birth year]  DATE OF ADMISSION: 04/27/2013  DATE OF CONSULTATION:  04/30/2013  REFERRING PHYSICIAN: Lisette Grinder, III, M.D.  CONSULTING PHYSICIAN:  Brunella Wileman B. Virginie Josten, MD  REASON FOR CONSULTATION: To evaluate a demented and agitated patient.   IDENTIFYING DATA: Jamie Glass is an 81 year old female with no past psychiatric history.   CHIEF COMPLAINT: "I lost my shoe."   HISTORY OF PRESENT ILLNESS: Jamie Glass has a history of medical problems but has not been compliant with doctor's visits or any medication except for [facility] powders. The patient feels that she is doing very well and does not need to be treated. Her family noticed that the patient has been short of breath and brought her to the hospital. At the moment, the patient denies any pain or discomfort, feels good and ready to go home.   She reports no past medical history but admits that recently she fell twice. She denies having any injury from the fall. She says that she just feels weak and drops on the floor wherever she stands. She certainly does not want to talk to a psychiatrist because she is not "crazy." There are no complaints of depression, anxiety, or psychosis. There are no symptoms suggestive of bipolar disorder. There are no alcohol or illicit substance problems.   PAST PSYCHIATRIC HISTORY: None.   FAMILY PSYCHIATRIC HISTORY: Unknown.   PAST MEDICAL HISTORY: Hypertensive, heart disease, osteoarthritis, diabetes, dyslipidemia, proteinuria, tricuspid regurgitation, colonic polyps.   ALLERGIES: SULFA DRUGS.   MEDICATIONS ON ADMISSION: None.   MEDICATIONS AT THE TIME OF CONSULTATION: Aspirin 81 mg daily, lisinopril 20 mg twice daily, metoprolol 50 mg twice daily, Pravachol 20 mg at bedtime, senna as needed, Ambien 5 mg as needed for insomnia, furosemide 40 mg twice daily, heparin injection, potassium chloride 20 mEq twice daily, Glucotrol XL 2.5 mg with breakfast,  sliding scale insulin, Imdur 30 mg daily, metformin 500 mg twice daily, nicotine inhaler kit, Seroquel 25 mg at bedtime.   SOCIAL HISTORY: She is retired. She lives with her son who comes home every day after work. 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 She denies going to church as she can find God anywhere. She has 1 daughter who brought her to the hospital. She has a big family, comes from a family of 10 siblings. Everybody got married and there are a lot of family members still around. She is a smoker.   REVIEW OF SYSTEMS: Difficult to obtain, but the patient denies any pain or discomfort.   PHYSICAL EXAMINATION:  VITAL SIGNS: Blood pressure 148/70, pulse 65, respirations 18, temperature 97.5.  GENERAL: A slender elderly female in no acute distress.   The rest of the physical examination is deferred to his primary attending.   LABORATORY DATA: Chemistries are within normal limits except for BUN of 23. LFTs within normal limits except for low albumin of 2.9 and AST of 38. Cardiac enzymes are negative but troponin elevated 0.34. TSH 3.66. CBC within normal limits. Urinalysis is not suggestive of urinary tract infection.   MENTAL STATUS EXAMINATION: The patient is sitting in a chair by the nursing station. She is cool and collected, pleasant, polite and cooperative. She is well groomed. She wears only 1 shoe. She believes that the second shoe was lost in the car. She maintains good eye contact. Her speech is soft. Mood is fine with full affect. She is funny. She denies thoughts of hurting  herself or others. There are no delusions or paranoia. There are no auditory or visual hallucinations. Her cognition is grossly intact but she is unwilling to participate in the formal part of examination. She is quite intelligent with good fund of knowledge. Her insight and judgment are limited.   DIAGNOSIS:  AXIS I: Cognitive disorder, not otherwise specified, rule out delirium due to unknown cause.  AXIS II: Deferred.  AXIS III: Medical problems as above.  AXIS IV: Cognitive decline, inability to care for herself, poor treatment compliance.  AXIS V: Global assessment of functioning, 40.   PLAN:  1.  The patient presented with mild cognitive decline this afternoon; however, reportedly there are periods of deep confusion which most likely indicates delirium.  2.  Agitation at night. Dr. Gilford Rile started the patient on 25 mg of Seroquel. Will continue. She is also on p.r.n. Ambien. I am going to change it to standing Ambien and see if this will help her sleep.  3.  Psychiatry will follow up.   ____________________________ Wardell Honour. Bary Leriche, MD jbp:np D: 04/30/2013 19:42:13 ET T: 04/30/2013 20:26:54 ET JOB#: [id number]  cc: Izola Teague B. Bary Leriche, MD, <Dictator> Clovis Fredrickson MD ELECTRONICALLY SIGNED 05/19/2013 14:51

## 2014-06-29 NOTE — Consult Note (Signed)
PATIENT NAME:  Jamie Glass, Jamie Glass MR#:  [id number] DATE OF BIRTH:  Oct 23, 1927  DATE OF CONSULTATION:  04/27/2013  REFERRING PHYSICIAN:  Jonny Ruiz B. Danne Harbor, MD CONSULTING PHYSICIAN:  Dwayne D. Callwood, MD  INDICATION: Shortness of breath, heart failure.   HISTORY OF PRESENT ILLNESS:  The patient is an 81 year old white female with hypertension, hyperlipidemia, diabetes. Denies any prior cardiac history. Over the last 2 weeks, the patient has been complaining of increasing shortness of breath, dyspnea. Recently visited [place] and came back with an acute dyspneic episode. Daughter brought her to the Emergency Room. She was found to be in pulmonary edema with elevated BNP, with shortness of breath, dyspnea. Denied any chest pain. Denied any lower extremity edema. No nausea, vomiting, headache, or blurred vision. No prior symptoms similarly.   REVIEW OF SYSTEMS: No blackout spells or syncope. No nausea or vomiting. No fever. No chills. No sweats. No weight loss. No weight gain. No hemoptysis, hematemesis. No bright red blood per rectum. No vision change or hearing change. No sputum production or cough. She has had shortness of breath, dyspnea.   PAST MEDICAL HISTORY: Hypertension, osteoarthritis, diabetes, hyperlipidemia, proteinuria,  tricuspid regurgitation, colonic polyps.   PAST SURGICAL HISTORY: Lumbar laminectomy, complete hysterectomy.   MEDICATIONS: The patient was basically on no medicines.   ALLERGIES: RASH FROM SULFA.   FAMILY HISTORY: Diabetes.   SOCIAL HISTORY: Widowed. Former smoker. Denies alcohol consumption. Retired.   PHYSICAL EXAMINATION:  VITAL SIGNS: Blood pressure initially was 200/115, is down to 150/70, pulse of 90. Respiratory rate now is 14. Afebrile.  HEENT: Normocephalic, atraumatic. Pupils equal and reactive to light.  NECK: Supple. No significant JVD, bruits, or adenopathy.  LUNGS: Clear bilaterally with rales in the bases. Adequate air movement. No rhonchi.   HEART: Regular rate and rhythm. Positive systolic ejection murmur, left sternal border. Positive S4. PMI slightly displaced laterally. Soft S3.  ABDOMEN: Benign. Positive bowel sounds. No rebound, guarding, or tenderness.  EXTREMITIES: Within normal limits. No cyanosis, clubbing, or edema.  NEUROLOGIC: Intact.  SKIN: Normal.   LABORATORY, DIAGNOSTIC, AND RADIOLOGICAL DATA: White count 4.3, hemoglobin 13, hematocrit 42, platelet count 240. Sodium 139, potassium 2.9, chloride 106, bicarbonate 26, BUN 15, creatinine 0.93, glucose 183, calcium 8.5. BNP 64,000. Troponin 0.02. Urinalysis was negative.   Chest x-ray: What appears to be bilateral pulmonary edema.   EKG: Normal sinus rhythm, left atrial enlargement, LVH, nonspecific ST-T wave changes.   ASSESSMENT: Congestive heart failure, acute respiratory failure, malignant hypertension, diabetes, hyperlipidemia, recent falls, past history of smoking.   PLAN:  1.  With what appears to be acute heart failure by chest x-ray, recommend continued cardiac work-up including diuretic therapy. Recommend echocardiogram for assessment of LV function. Lasix IV every 8 to 12 hours, 40 mg. Recommend ACE inhibitor therapy. Recommend low-dose beta blocker therapy.  2.  For malignant hypertension, recommend blood pressure control with ACE inhibitor probably. A beta blocker also may be helpful. Hydralazine and nitrates also. Metoprolol should be helpful as well as lisinopril. Lasix therapy. Once the blood pressure is improved, I would suspect her pulmonary edema will improve. 3.  Rule out for myocardial infarction. Follow up cardiac enzymes to make sure that this is not an acute cardiac event with residual heart failure.  4.  Echocardiogram will be helpful to assess LV function to be sure she does not have a cardiomyopathy of unclear etiology, and then recommend further work-up accordingly.  5.  For diabetes, continue insulin therapy. Follow up  hemoglobin A1c and  fasting sugars.  6.  For hyperlipidemia, pravastatin therapy. Continue current care.  7.  She has had some falls. Continue current therapy. Physical therapy may be helpful.  8.  Again, we will base further evaluation on improving her heart failure symptoms and treating the patient medically. The patient at some point may need an invasive cardiac evaluation with cardiac cath or possibly a Myoview study, but will treat the patient medically in the interim. Continue telemetry.  ____________________________ Bobbie Stackwayne D. Juliann Paresallwood, MD ddc:jcm D: 04/27/2013 15:42:06 ET T: 04/27/2013 16:15:52 ET JOB#: [id number]  cc: Dwayne D. Juliann Paresallwood, MD, <Dictator> Alwyn PeaWAYNE D CALLWOOD MD ELECTRONICALLY SIGNED 06/04/2013 7:05

## 2014-06-29 NOTE — Discharge Summary (Signed)
PATIENT NAME:  Jamie Glass, Jamie Glass MR#:  [id number] DATE OF BIRTH:  09/24/1927  DATE OF ADMISSION:  04/27/2013 DATE OF DISCHARGE:  05/04/2013  Jamie Glass is an 81 year old black lady who presented to the Emergency Room with shortness of breath. In the Emergency Room, chest x-ray showed pulmonary edema and laboratory testing revealed an elevated BNP. It was noted that the patient had been living with her son. He apparently works third shift. Her husband had recently died and had been her primary caregiver. It appeared, on admission, that the patient had not been taking any of her routine medications for quite some time.   PAST MEDICAL HISTORY: Notable for hypertensive heart disease, osteoarthritis, type 2 diabetes, hyperlipidemia, tricuspid regurgitation, a history of proteinuria and a history of colonic polyps.   PAST SURGICAL HISTORY: Included a previous lumbar laminectomy and a previous hysterectomy.   ADMISSION PHYSICAL EXAMINATION: As described by the admitting physician, revealed a temperature of 97.9, pulse of 90, respirations 18, blood pressure 214/115. Pulse ox was 97% on room air. Exam was notable for JVD up to the angle of the jaw. She also had diffuse crackles and rales bilaterally. The remainder of the exam was relatively unremarkable.   ADMISSION LABORATORY WORK: Included a hemoglobin of 13 with a hematocrit of 42. Platelet count was 240,000. White count was 4300. Sodium was 139, potassium 3.9 and chloride 106. BUN was 15 with a creatinine of 0.93. Random blood sugar was 183. Calcium was 8.5. BNP was 63,893. Urinalysis was unremarkable. Initial troponin was 0.02. EKG showed a normal sinus rhythm with left atrial enlargement and left ventricular hypertrophy.   The patient was admitted to the regular medical floor where she was placed on telemetry. She was started on Lasix 40 mg IV every 8 hours. She was also started back on her routine preadmission medications. She was also started back on her  pravastatin. She was seen in consultation by cardiology. Her hospital course was one of slow but gradual improvement. She was eventually weaned from the IV Lasix maintained on herce medications. Her hospitalization was prolonged by agitated  dementia. She was seen in consultation by Behavioral Medicine. She was placed on Abilify and eventually settled down. Initially, it appeared that the patient was going to need to go to geriatric psych unit; however, she improved enough that they felt she was safe to go home. The family stated that they could arrange for 24/7 supervision. Home health was set up to make sure that the patient did get her medications as directed.   DISCHARGE DIAGNOSES: 1.  Noncompliance with medication.  2.  Hypertensive crisis with acute systolic congestive heart failure.  3.  Dementia.  4.  Type 2 diabetes.   DISCHARGE MEDICATIONS: 1.  Lisinopril 20 mg b.i.d.  2.  Imdur 30 mg daily.  3.  Metformin 500 mg b.i.d.  4.  Glipizide 2.5 mg daily.  5.  Pravastatin 20 mg daily.  6.  Aspirin 81 mg daily.  7.  Abilify 5 mg 3 times daily as needed.  8.  Abilify 5 mg at bedtime.  9.  Metoprolol 50 mg every 12 hours.  10.  Furosemide 40 mg b.i.d.  11.  Potassium chloride 1 tablet b.i.d.   The patient was discharged on a low sodium, carbohydrate-controlled diet. She is to have activity as tolerated. She is being discharged with home health.   The patient is to be followed up in the office in 1 to 2 weeks.   ____________________________ Jonny RuizJohn  Jackson Latino, MD jbw:dmm D: 05/18/2013 07:32:09 ET T: 05/18/2013 11:41:14 ET JOB#: [id number]  cc: Letta Pate. Danne Harbor, MD, <Dictator> Elmo Putt III MD ELECTRONICALLY SIGNED 05/21/2013 7:55

## 2014-06-29 NOTE — H&P (Signed)
PATIENT NAME:  Jamie Glass, Jamie Glass MR#:  [id number] DATE OF BIRTH:  Nov 13, 1927  DATE OF ADMISSION:  11/26/2013  PRIMARY CARE PHYSICIAN:  John B. Danne HarborWalker, III, MD   NEPHROLOGIST: Laverda SorensonSarath C. Kolluru, MD    CHIEF COMPLAINT: Left and numbness.   HISTORY OF PRESENT ILLNESS: Jamie Glass is an 81 year old African-American female with history of hypertension and diabetes, comes to the Emergency Room after she complained of left hand numbness, which has been on and off since the past week, to Dr. Wynelle LinkKolluru on her routine visit. Dr. Wynelle LinkKolluru sent the patient to the Emergency Room for further evaluation and management. CT of the head showed old infarct. The patient does not have any symptoms. She tells me her left arm symptoms have resolved and she is requesting to go home. However, multiple infarcts were noted, which appear to be old, which were noted on the CT of the head; hence, the patient is advised to get admitted for further work-up on her stroke given her risk factors for hypertension and diabetes. The patient denies any dysarthria or any focal weakness. She uses a cane to ambulate at home.   ALLERGIES: No known drug allergies.   PAST MEDICAL HISTORY: 1.  Hypertension.  2.  Partial hysterectomy.  3.  Diabetes.  4.  Hyperlipidemia.   MEDICATIONS:  1.  Aspirin 81 mg daily. 2.  Glipizide 2.5 mg daily.  3.  Imdur extended release 30 mg daily.  4.  Naproxen 375 mg 1 tablet 3 times a day.  5.  Olanzapine 5 mg at bedtime.  6.  Pravastatin 20 mg at bedtime.   FAMILY HISTORY: Positive for hypertension.   SOCIAL HISTORY: The patient lives with her daughter. Nonsmoker, nonalcoholic.   REVIEW OF SYSTEMS:   CONSTITUTIONAL: No fever, fatigue or weakness.  EYES: No blurred or double vision or cataract or glaucoma.  ENT: No tinnitus, ear pain, hearing loss.  RESPIRATORY: No cough, wheeze, hemoptysis, or asthma.  CARDIOVASCULAR: No chest pain, orthopnea, or edema. Positive for hypertension.   GASTROINTESTINAL: No nausea, vomiting, diarrhea, or GERD.  GENITOURINARY: No dysuria, hematuria, or frequency.  ENDOCRINE: No polyuria, nocturia, or thyroid problems.  HEMATOLOGY: No anemia, easy bruising, or bleeding.  SKIN: No acne, rash, or lesion.  MUSCULOSKELETAL: Positive for arthritis; no swelling, gout, or cramps.  NEUROLOGIC:  Positive for some weakness in her left arm, initially.  No dysarthria, headache or dementia, or ataxia.  PSYCHIATRIC: No anxiety, depression, or bipolar disorder.   All other systems reviewed and negative.   PHYSICAL EXAMINATION: GENERAL: The patient is awake, alert, oriented x3, not in acute distress. She is afebrile. Pulse is 70, blood pressure is 167/76, saturations are 96% on room air.  HEENT: Atraumatic, normocephalic. Pupils PERRLA, EOMI intact. Oral mucosa is moist.  NECK: Supple. No JVD. No carotid bruit.  RESPIRATORY: Clear to auscultation bilaterally. No rales, rhonchi, respiratory distress, or labored breathing.  CARDIOVASCULAR: Both heart sounds are normal. Rate and rhythm regular. PMI not lateralized. Chest nontender. Good pedal pulses. Good femoral pulses. No lower extremity edema.  ABDOMEN: Soft, benign, nontender. No organomegaly. Positive bowel sounds.  NEUROLOGIC: Grossly intact cranial nerves II through XII. The patient has good strength in both upper and lower extremities, 4+/5 in both upper and lower extremities. No focal weakness or any dysarthria noted. Her sensory exam in both upper and lower extremities as well as facial remains intact. Plantars are downgoing. Deep tendon jerks are 1+ in both upper and lower extremities. Gait not tested.  SKIN: Warm and dry.  PSYCHIATRIC: The patient is awake, alert, oriented x3.   LABORATORY DATA: Cardiac enzymes first set negative. Urinalysis negative for UTI. Comprehensive metabolic panel within normal limits except glucose of 110, BUN is 26. CBC within normal limits. Lipid profile within normal  limits.   IMAGING: CT of the head shows atrophy with small vessel chronic ischemic changes of the deep cerebral white matter or lacunar infarcts, basal ganglia in left side. Small old-appearing high right parietal infarct. No definite acute intracranial abnormalities.   ASSESSMENT: [age]-year-old, Jamie Glass with history of hypertension and diabetes comes in with:  1.  Possible/suspected cerebrovascular accident. The patient came in with left hand numbness on and off for 1 week. Her symptoms have resolved. Her CT head shows multiple old infarcts. She continues to take a baby aspirin. I will up the dose to a full dose aspirin. The patient is symptom-free at this time. We will admit her on telemetry floor, get echo Doppler, carotid Doppler and MRI of the brain. Physical therapy to see the patient.  2.  Hyperlipidemia. Continue pravastatin.  3.  Type 2 diabetes. Continue glipizide and sliding scale insulin.  4.  Deep vein thrombosis prophylaxis with heparin subcutaneous t.i.d.  5.  Care management for discharge planning.  6.  The patient is a full code.   Above was discussed with the patient's daughter and the patient; she is agreeable to it.   TIME SPENT: Thirty-five minutes.    ____________________________ Wylie Hail Allena Katz, MD sap:lr D: 11/26/2013 15:21:01 ET T: 11/26/2013 15:50:22 ET JOB#: [id number]  cc: Rayaan Lorah A. Allena Katz, MD, <Dictator> Laverda Sorenson, MD Letta Pate. Danne Harbor, MD Willow Ora MD ELECTRONICALLY SIGNED 11/29/2013 15:20

## 2014-06-29 NOTE — Consult Note (Signed)
Brief Consult Note: Diagnosis: Cognitive disorder NOS.   Patient was seen by consultant.   Consult note dictated.   Recommend further assessment or treatment.   Orders entered.   Comments: [name] has no psychiatric past. She has not been compliant with treatment at home and comes for accelerated HTN. Initially, she was agitated at night. This has resolved with treatment and improvement of her general health. She is calm and collected. Her family and treatment team agree that the patient may return to home where she lives with her son.   MSE: Alert, oriented x3. No behavioral problems. She is eating her lunch. She has very appropriate interaction with me. She maintains eye contect. Speech is soft. Mood is fine. Thought process is slow. She is not suicidal or homicidal. There are paranoid delusions. No evidence of hallucinations. Cognition impaired. Unable to participate in cognitive part of exam. Insight and judgment have improved.    PLAN: 1. The patient is referred to geropsychiatry unit.   2. We continue Zyprexa zydis 5 mg at night and as needed for agitation.   3. I will follow along.  Electronic Signatures: Kristine LineaPucilowska, Dulcie Gammon (MD)  (Signed 27-Feb-15 12:02)  Authored: Brief Consult Note   Last Updated: 27-Feb-15 12:02 by Kristine LineaPucilowska, Herschell Virani (MD)

## 2014-08-21 ENCOUNTER — Ambulatory Visit: Payer: Medicare Other | Attending: Neurology | Admitting: Physical Therapy

## 2014-08-21 ENCOUNTER — Encounter: Payer: Self-pay | Admitting: Physical Therapy

## 2014-08-21 DIAGNOSIS — F4541 Pain disorder exclusively related to psychological factors: Secondary | ICD-10-CM

## 2014-08-21 DIAGNOSIS — M4802 Spinal stenosis, cervical region: Secondary | ICD-10-CM | POA: Diagnosis present

## 2014-08-21 DIAGNOSIS — M542 Cervicalgia: Secondary | ICD-10-CM | POA: Diagnosis not present

## 2014-08-21 DIAGNOSIS — M436 Torticollis: Secondary | ICD-10-CM

## 2014-08-21 HISTORY — DX: Pain disorder exclusively related to psychological factors: F45.41

## 2014-08-22 NOTE — Therapy (Signed)
[facility] MAIN [facility] SERVICES [address] [place], [place], [address] Phone: [phone]   Fax:  [phone]  Physical Therapy Evaluation  Patient Details  Name: Jamie Glass MRN: [id number] Date of Birth: [birth year] Referring Provider:  Morene Crocker, MD  Encounter Date: 08/21/2014      PT End of Session - 08/21/14 1730    Visit Number 1   Number of Visits 13   Date for PT Re-Evaluation 10/02/14   Authorization Type Gcodes 1   Authorization Time Period 10    PT Start Time 1600   PT Stop Time 1700   PT Time Calculation (min) 60 min   Activity Tolerance Patient limited by pain      Past Medical History  Diagnosis Date  . Anxiety   . Diabetes mellitus without complication   . Hypertension   . CHF (congestive heart failure)   . Arthritis   . Chronic kidney disease   . Stress headaches 08/21/2014    History reviewed. No pertinent past surgical history.  There were no vitals filed for this visit.  Visit Diagnosis:  Neck pain on right side - Plan: PT plan of care cert/re-cert  Stiffness of cervical spine - Plan: PT plan of care cert/re-cert      Subjective Assessment - 08/21/14 1610    Subjective Pt report pain in the right side of cervical spine. Notes that she fell in the past 2 weeks going through door, but neck pain has persisted for a couple months. Numbness/tingling in R UE below the elbow into the hand    Patient is accompained by: Family member   Pertinent History No surgies, no history of cancer, self reported visit to hospital for "heart stress"    How long can you sit comfortably? able to sit for a couple hours resulting in neck pain    How long can you stand comfortably? " a good while"    How long can you walk comfortably? walk about a hour before neck becomes problem    Diagnostic tests MRI: reveals muti-level cervical  stenosis from osteophytes.    Patient Stated Goals Increase ROM in neck and decrease  pain in neck    Currently in Pain? No/denies   Aggravating Factors  No particular position that aggrevate,    Pain Relieving Factors Massage,             [facility] PT Assessment - 08/22/14 1525    Assessment   Medical Diagnosis Cervical stenois, Numbness/tingling in hands    Onset Date/Surgical Date 02/19/14   Hand Dominance Right   Next MD Visit End of July 2016    Prior Therapy no    Home Environment   Living Environment Private residence   Living Arrangements Children   Available Help at Discharge Family   Type of Home House   Home Access Stairs to enter   Entrance Stairs-Number of Steps 7    Entrance Stairs-Rails Left   Home Layout One level   Home Equipment [place] - single point   Additional Comments SPC use for roughly 1 year    Prior Function   Level of Independence Independent with household mobility with device   Vocation Retired   Leisure Watch TV,    Cognition   Overall Cognitive Status History of cognitive impairments - at baseline   Observation/Other Assessments   Neck Disability Index  40% impaired.    Sensation   Light Touch Appears Intact  Additional Comments light touch sensation grossly intact bilaterally, with slight decreased sensation on R distal arm.    Posture/Postural Control   Posture/Postural Control Postural limitations   Postural Limitations Forward head   AROM   Overall AROM Comments Cervical Rotation L 35, R 36, L side bend 10, R side bend 20, cervical flexion 25, cervical exension 27, Supine UE AROM R shoulder flexion WFL, L shoulder flexion 130, r shoulder ABD 130, L shoulder ABD 120; gross functional AROM impaired: pt only able to reach posterior hip, and anterior contralateral shoulder with either UE. All end range motions increase pain.     Strength   Overall Strength Comments gross UE strength 4/5. (within pain free range)    Palpation   Palpation comment multiple trigger points located in cervical spine. C5-T1 hypomoble PA and rotational  joint mobs grade III. C2-C4 hypomobillity with bilateral rotational mobs grade III (R>L)   Ambulation/Gait   Gait Comments Pt ambulates with SPC, reciprocol gait pattern, decreased bilateral step length.    Balance   Balance comment Pt demonstrates adequte balance, but reports multiple falls within the last 6 months. Pt able to demonstrate proper corrective techniques when balance challanged with sudden changes of direction.                            PT Education - 08/21/14 1716    Education provided Yes   Education Details Plan of care, treatment modalities, affects of posture on neck pain .    Person(s) Educated Patient;Child(ren)   Methods Explanation;Demonstration   Comprehension Verbalized understanding;Returned demonstration;Need further instruction             PT Long Term Goals - 08/22/14 0754    PT LONG TERM GOAL #1   Title Pt will increase cervical rotation bilaterally by >15 degrees to allow increased safety with ADLs 10/02/2014   Baseline 36 degrees   Time 6   Period Weeks   Status New   PT LONG TERM GOAL #2   Title Pt will increase cervical extension and flexion by >15 degrees to allow increased function with bathing 10/02/2014    Baseline 25 degrees    Time 6   Period Weeks   Status New   PT LONG TERM GOAL #3   Title Pt will decreased percieved impairment as noted by a >15% reduction of disability on the NDI. 10/02/2014   Baseline 40% impaired   Time 6   Period Weeks   Status New   PT LONG TERM GOAL #4   Title Pt will increase bilateral functional shoulder ROM to allow reach of low back and posterior contralateral shoulder to increase ease of bathing activities. 10/01/2014   Time 6   Period Weeks   Status New               Plan - 08/21/14 1734    Clinical Impression Statement Pt presents to PT with chronic neck pain and R UE numbness/tingling distal to elbow that occasionally is noticed in back of the hand seconary to central  spinal stenosis. Pt demonstrate signification decreased cervical ROM; all motions increases pain. Via NDI patient is self reported as 40% impaired due to neck pain. Multiple trigger points and joint decrease cervical joint mobilityfound through palpation. This patient requires skilled PT in order to increase cervical ROM, increase UE ROM, decrease pain, and improve posture to allow improved independence with ADL and iADLS.   Pt will  benefit from skilled therapeutic intervention in order to improve on the following deficits Decreased activity tolerance;Decreased endurance;Decreased mobility;Decreased range of motion;Decreased safety awareness;Decreased strength;Hypomobility;Increased fascial restricitons;Impaired flexibility;Impaired sensation;Impaired UE functional use;Improper body mechanics;Pain   Rehab Potential Good   Clinical Impairments Affecting Rehab Potential Positives: motivated to decrease pain. Negative: co-morbidities, altered mentation.    PT Frequency 2x / week   PT Duration 6 weeks   PT Treatment/Interventions ADLs/Self Care Home Management;Cryotherapy;Electrical Stimulation;Iontophoresis /ml Dexamethasone;Moist Heat;Traction;Ultrasound;Therapeutic activities;Therapeutic exercise;Gait training;Stair training;Balance training;Neuromuscular re-education;Patient/family education;Manual techniques;Passive range of motion;Dry needling   PT Next Visit Plan manual therapy, cervical ROM , postural exercises.    PT Home Exercise Plan will address next PT visit   Consulted and Agree with Plan of Care Patient          G-Codes - 09/09/14 1748    Functional Assessment Tool Used Neck disability index, clinical judgement, overall strength   Functional Limitation Self care   Self Care Current Status (Z6109) At least 40 percent but less than 60 percent impaired, limited or restricted   Self Care Goal Status (U0454) At least 1 percent but less than 20 percent impaired, limited or restricted        Problem List There are no active problems to display for this patient.   Hopkins,Margaret, PT, DPT 08/22/2014, 3:28 PM  [facility] MAIN [facility] SERVICES [address] [place], [place], [address] Phone: [phone]   Fax:  [phone]

## 2014-08-26 ENCOUNTER — Encounter: Payer: Medicare Other | Admitting: Physical Therapy

## 2014-08-28 ENCOUNTER — Encounter: Payer: Self-pay | Admitting: Physical Therapy

## 2014-08-28 ENCOUNTER — Ambulatory Visit: Payer: Medicare Other | Admitting: Physical Therapy

## 2014-08-28 DIAGNOSIS — M4802 Spinal stenosis, cervical region: Secondary | ICD-10-CM | POA: Diagnosis not present

## 2014-08-28 DIAGNOSIS — M542 Cervicalgia: Secondary | ICD-10-CM

## 2014-08-28 DIAGNOSIS — M436 Torticollis: Secondary | ICD-10-CM

## 2014-08-28 NOTE — Therapy (Signed)
[place] [facility] MAIN [facility] SERVICES [address] [place], [place], [address] Phone: [phone]   Fax:  [phone]  Physical Therapy Treatment  Patient Details  Name: Jamie Glass MRN: [id number] Date of Birth: [birth year] Referring Provider:  Rafael Bihari, MD  Encounter Date: 08/28/2014      PT End of Session - 08/28/14 1650    Visit Number 2   Number of Visits 13   Date for PT Re-Evaluation 10/02/14   Authorization Type Gcodes 2   Authorization Time Period 10    PT Start Time 1345   PT Stop Time 1430   PT Time Calculation (min) 45 min   Activity Tolerance Patient tolerated treatment well      Past Medical History  Diagnosis Date  . Anxiety   . Diabetes mellitus without complication   . Hypertension   . CHF (congestive heart failure)   . Arthritis   . Chronic kidney disease   . Stress headaches 08/21/2014    History reviewed. No pertinent past surgical history.  There were no vitals filed for this visit.  Visit Diagnosis:  Neck pain on right side  Stiffness of cervical spine      Subjective Assessment - 08/28/14 1412    Subjective Patient reports increased stiffness in her neck; no pain currently; She presents with SPC and ambulates with forward flexed head;   Patient is accompained by: Family member   Pertinent History No surgies, no history of cancer, self reported visit to hospital for "heart stress"    How long can you sit comfortably? able to sit for a couple hours resulting in neck pain    How long can you stand comfortably? " a good while"    How long can you walk comfortably? walk about a hour before neck becomes problem    Diagnostic tests MRI: reveals muti-level cervical  stenosis from osteophytes.    Patient Stated Goals Increase ROM in neck and decrease pain in neck    Currently in Pain? No/denies        TREATMENT: Instructed patient in HEP- see patient instructions: Red tband: BUE shoulder  extension x10; BUE shoulder rows x10; Seated chin tucks x5, patient required max VCs for positioning and correct technique having difficulty performing chin tuck; Seated: Cervical AROM in all directions x5 each with cues to increase ROM to tolerance;  Patient required min-moderate verbal/tactile cues for correct exercise technique including cues to increase scapular retraction with tband exercise;  PT applied moist heat to cervical parapsinals in supine x5 min with HEP instruction;  Manual therapy: PT performed sub occipital release x1 min x4 reps with cues to relax for better suboccipital stretch; PT performed PROM of cervical spine rotation stretch 10 sec hold x2 bilaterally, lateral flexion stretch 20 sec hold x2 bilaterally; PT performed soft tissue massage of cervical paraspinals (R>L) x5 min PT performed gentle cervical distraction 10 sec hold, 10 sec rest x3 min;   Patient reports less pain/stiffness following manual therapy;                        PT Education - 08/28/14 1649    Education provided Yes   Education Details HEP- see patient instructions   Person(s) Educated Patient   Methods Explanation;Demonstration;Tactile cues;Verbal cues;Handout   Comprehension Verbalized understanding;Returned demonstration;Verbal cues required;Tactile cues required             PT Long Term Goals - 08/22/14 0754  PT LONG TERM GOAL #1   Title Pt will increase cervical rotation bilaterally by >15 degrees to allow increased safety with ADLs 10/02/2014   Baseline 36 degrees   Time 6   Period Weeks   Status New   PT LONG TERM GOAL #2   Title Pt will increase cervical extension and flexion by >15 degrees to allow increased function with bathing 10/02/2014    Baseline 25 degrees    Time 6   Period Weeks   Status New   PT LONG TERM GOAL #3   Title Pt will decreased percieved impairment as noted by a >15% reduction of disability on the NDI. 10/02/2014   Baseline 40%  impaired   Time 6   Period Weeks   Status New   PT LONG TERM GOAL #4   Title Pt will increase bilateral functional shoulder ROM to allow reach of low back and posterior contralateral shoulder to increase ease of bathing activities. 10/01/2014   Time 6   Period Weeks   Status New               Plan - 08/28/14 1650    Clinical Impression Statement Patient presents to therapy with increased stiffness in cervical spine; PT instructed patient in HEP consisting of postural strengthening and cervical ROM; Patient required increased cues for correct positioning and to avoid painful ROM; PT also performed manual therapy to stretch suboccipital muscles to improve postural with less forward head and to reduce right cervical pain/discomfort; Patient would benefit from additional skilled PT intervention to improve cervical ROM, reduce pain and improve mobility.    Pt will benefit from skilled therapeutic intervention in order to improve on the following deficits Decreased activity tolerance;Decreased endurance;Decreased mobility;Decreased range of motion;Decreased safety awareness;Decreased strength;Hypomobility;Increased fascial restricitons;Impaired flexibility;Impaired sensation;Impaired UE functional use;Improper body mechanics;Pain   Rehab Potential Good   Clinical Impairments Affecting Rehab Potential Positives: motivated to decrease pain. Negative: co-morbidities, altered mentation.    PT Frequency 2x / week   PT Duration 6 weeks   PT Treatment/Interventions ADLs/Self Care Home Management;Cryotherapy;Electrical Stimulation;Iontophoresis 4mg /ml Dexamethasone;Moist Heat;Traction;Ultrasound;Therapeutic activities;Therapeutic exercise;Gait training;Stair training;Balance training;Neuromuscular re-education;Patient/family education;Manual techniques;Passive range of motion;Dry needling   PT Next Visit Plan Review HEP- especially chin tucks, work on manual therapy and ROM;   PT Home Exercise Plan  Advanced- see patient instructions   Consulted and Agree with Plan of Care Patient        Problem List There are no active problems to display for this patient.   Hopkins,Margaret, PT, DPT 08/28/2014, 4:55 PM  [place] [facility] MAIN [facility] SERVICES [address] [place], [place], [address] Phone: [phone]   Fax:  [phone]

## 2014-08-28 NOTE — Patient Instructions (Signed)
Head Rotation   Slowly turn head to one side, then the other. Hold _3__ seconds. Repeat _10__ times each side, alternating. Do __5_ sessions per day.  Copyright  VHI. All rights reserved.  Neck Side-Bending   Begin with chin level, head centered over spine. Slowly lower one ear toward shoulder. Hold __3__ seconds. Slowly return to starting position. Repeat to other side. Repeat __10__ times to each side. Do every hour that you are awake  Copyright  VHI. All rights reserved.  Neck: Retraction   Sit with back and head straight. Pull chin back to line up ear with shoulder. Do not turn or tilt head. You can use your hand to help if needed. Hold _5___ seconds. Repeat _10___ times. Do __5__ sessions per day. CAUTION: Movement should be gentle, steady and slow.   Copyright  VHI. All rights reserved.  Neck Flexion   Begin with chin level, head centered over spine. Slowly lower chin toward chest. Hold _3___ seconds. Slowly return to starting position. Repeat __10__ times. Repeat every hour that you are awake  Copyright  VHI. All rights reserved.  AROM: Neck Extension   Bend head backward. Hold _3___ seconds. Repeat _10___ times per set. Do __1__ sets per session. Do _5__ sessions per day.  http://orth.exer.us/301   Copyright  VHI. All rights reserved.   SHOULDER: Diagonal - Up and Away (Band)   Start with arm down and across body. Holding band, pull up and out. End with elbow straight and palm forward. Hold ___ seconds. Use ________ band. ___ reps per set, ___ sets per day, ___ days per week  Copyright  VHI. All rights reserved.  Shoulder Retraction  You can do this standing with red tband around door knob. Facing chest height anchor, grasp ends of band and pull hands to chest, squeezing shoulder blades together. Hold _2__ seconds. Repeat _10__ times. Do __2_ sessions per day. Safety Note: Be sure anchor is secure.  Copyright  VHI. All rights reserved.  Shoulder  Extension (Sit Box)   Standing with red tband around door knob, keep elbows straight and pull both arms back trying to squeeze shoulder blades (avoid painful range of motion); Do 10___ times, _2__ times per day.  Copyright  VHI. All rights reserved.

## 2014-08-29 ENCOUNTER — Ambulatory Visit: Payer: Medicare Other | Admitting: Physical Therapy

## 2014-08-29 ENCOUNTER — Encounter: Payer: Self-pay | Admitting: Physical Therapy

## 2014-08-29 DIAGNOSIS — M542 Cervicalgia: Secondary | ICD-10-CM

## 2014-08-29 DIAGNOSIS — M4802 Spinal stenosis, cervical region: Secondary | ICD-10-CM | POA: Diagnosis not present

## 2014-08-29 DIAGNOSIS — M436 Torticollis: Secondary | ICD-10-CM

## 2014-08-29 NOTE — Therapy (Signed)
[place] [facility] MAIN [facility] SERVICES [address] [place], [place], [address] Phone: [phone]   Fax:  [phone]  Physical Therapy Treatment  Patient Details  Name: Jamie Glass MRN: [id number] Date of Birth: [birth year] Referring Provider:  Rafael Bihari, MD  Encounter Date: 08/29/2014      PT End of Session - 08/29/14 1638    Visit Number 3   Number of Visits 13   Date for PT Re-Evaluation 10/02/14   Authorization Type Gcodes 3   Authorization Time Period 10    PT Start Time 1545   PT Stop Time 1630   PT Time Calculation (min) 45 min   Activity Tolerance Patient tolerated treatment well      Past Medical History  Diagnosis Date  . Anxiety   . Diabetes mellitus without complication   . Hypertension   . CHF (congestive heart failure)   . Arthritis   . Chronic kidney disease   . Stress headaches 08/21/2014    History reviewed. No pertinent past surgical history.  There were no vitals filed for this visit.  Visit Diagnosis:  Neck pain on right side  Stiffness of cervical spine      Subjective Assessment - 08/29/14 1633    Subjective Pt reports that she has no pain in neck upon arrival to PT, but notes significant stiffness. Pt reports that she has not been able to do exercises or remember what they are.    Patient is accompained by: Family member   Pertinent History No surgies, no history of cancer, self reported visit to hospital for "heart stress"    How long can you sit comfortably? able to sit for a couple hours resulting in neck pain    How long can you stand comfortably? " a good while"    How long can you walk comfortably? walk about a hour before neck becomes problem    Diagnostic tests MRI: reveals muti-level cervical  stenosis from osteophytes.    Patient Stated Goals Increase ROM in neck and decrease pain in neck    Currently in Pain? No/denies           TREATMENT: Instructed patient in HEP- see  patient instructions: Red tband: BUE shoulder extension x10; BUE shoulder rows x10; Max VCs and tactile cues for proper UE motion.  Seated chin tucks x10, patient required max VCs and tactile cues on posterior occiput for positioning and correct technique having difficulty performing chin tuck; Seated: Cervical AROM in all directions x10 each with cues to increase ROM to tolerance; tactile cues to isolate movements   Patient required moderate-max verbal/tactile cues for correct exercise technique including cues to increase scapular retraction with tband exercise;   Manual therapy: PT performed sub occipital release x1 min x4 reps with cues to relax for better suboccipital stretch; PT performed PROM of cervical spine rotation stretch 20 sec hold x2 bilaterally, lateral flexion stretch 30 sec hold x2 bilaterally; PT performed soft tissue massage of cervical paraspinals (R>L) x5 min PT performed gentle cervical distraction 20 sec hold, 10 sec rest x3 min;   Patient reports less pain/stiffness following manual therapy;                        PT Education - 08/29/14 1637    Education Details re-educate HEP, ROM exercises   Person(s) Educated Patient;Child(ren)   Methods Explanation;Demonstration;Tactile cues;Verbal cues   Comprehension Verbalized understanding;Returned demonstration;Verbal cues required;Tactile cues  required             PT Long Term Goals - 08/22/14 0754    PT LONG TERM GOAL #1   Title Pt will increase cervical rotation bilaterally by >15 degrees to allow increased safety with ADLs 10/02/2014   Baseline 36 degrees   Time 6   Period Weeks   Status New   PT LONG TERM GOAL #2   Title Pt will increase cervical extension and flexion by >15 degrees to allow increased function with bathing 10/02/2014    Baseline 25 degrees    Time 6   Period Weeks   Status New   PT LONG TERM GOAL #3   Title Pt will decreased percieved impairment as noted by a >15%  reduction of disability on the NDI. 10/02/2014   Baseline 40% impaired   Time 6   Period Weeks   Status New   PT LONG TERM GOAL #4   Title Pt will increase bilateral functional shoulder ROM to allow reach of low back and posterior contralateral shoulder to increase ease of bathing activities. 10/01/2014   Time 6   Period Weeks   Status New               Plan - 08/29/14 1639    Clinical Impression Statement Pt reports to PT with no pain, but significant cervical stiffness. Pt re-educated in HEP; Required MAX verbal and tactile instruction to complete home program exercises. PT also provided suboccipitcal release, soft tissue massage in cervical paraspinals and PROM for cervical rotation/side bending. Pt reports decreased stiffness after manual therapy. Pt would benefit from skilled PT in order to increase cervical ROM, decrease pain, and improve mobility.     Pt will benefit from skilled therapeutic intervention in order to improve on the following deficits Decreased activity tolerance;Decreased endurance;Decreased mobility;Decreased range of motion;Decreased safety awareness;Decreased strength;Hypomobility;Increased fascial restricitons;Impaired flexibility;Impaired sensation;Impaired UE functional use;Improper body mechanics;Pain   Rehab Potential Good   Clinical Impairments Affecting Rehab Potential Positives: motivated to decrease pain. Negative: co-morbidities, altered mentation.    PT Frequency 2x / week   PT Duration 6 weeks   PT Treatment/Interventions ADLs/Self Care Home Management;Cryotherapy;Electrical Stimulation;Iontophoresis /ml Dexamethasone;Moist Heat;Traction;Ultrasound;Therapeutic activities;Therapeutic exercise;Gait training;Stair training;Balance training;Neuromuscular re-education;Patient/family education;Manual techniques;Passive range of motion;Dry needling   PT Next Visit Plan advance postural exercises. review chin tucks, work on manual therapy and ROM;   PT Home  Exercise Plan continue as previously given   Consulted and Agree with Plan of Care Patient        Problem List There are no active problems to display for this patient.   Grier Rocher SPT 08/29/2014   5:10 PM This entire session was performed under direct supervision and direction of a licensed therapist . I have personally read, edited and approve of the note as written.  Hopkins,MargaretPT, DPT 08/29/2014, 5:10 PM  [place] [facility] MAIN [facility] SERVICES [address] [place], [place], [address] Phone: [phone]   Fax:  [phone]

## 2014-09-10 ENCOUNTER — Encounter: Payer: Medicare Other | Admitting: Physical Therapy

## 2014-09-12 ENCOUNTER — Encounter: Payer: Medicare Other | Admitting: Physical Therapy

## 2014-09-16 ENCOUNTER — Ambulatory Visit: Payer: Medicare Other | Admitting: Physical Therapy

## 2014-09-18 ENCOUNTER — Encounter: Payer: Self-pay | Admitting: Physical Therapy

## 2014-09-18 ENCOUNTER — Ambulatory Visit: Payer: Medicare Other | Attending: Family Medicine | Admitting: Physical Therapy

## 2014-09-18 ENCOUNTER — Encounter: Payer: Medicare Other | Admitting: Physical Therapy

## 2014-09-18 DIAGNOSIS — M436 Torticollis: Secondary | ICD-10-CM

## 2014-09-18 DIAGNOSIS — M4802 Spinal stenosis, cervical region: Secondary | ICD-10-CM | POA: Insufficient documentation

## 2014-09-18 DIAGNOSIS — M542 Cervicalgia: Secondary | ICD-10-CM | POA: Diagnosis not present
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
## 2014-09-23 ENCOUNTER — Encounter: Payer: Medicare Other | Admitting: Physical Therapy

## 2014-09-25 ENCOUNTER — Encounter: Payer: Self-pay | Admitting: Physical Therapy

## 2014-09-25 ENCOUNTER — Encounter: Payer: Medicare Other | Admitting: Physical Therapy

## 2014-09-25 ENCOUNTER — Ambulatory Visit: Payer: Medicare Other | Admitting: Physical Therapy

## 2014-09-25 DIAGNOSIS — M436 Torticollis: Secondary | ICD-10-CM

## 2014-09-25 DIAGNOSIS — M542 Cervicalgia: Secondary | ICD-10-CM

## 2014-09-25 DIAGNOSIS — M4802 Spinal stenosis, cervical region: Secondary | ICD-10-CM | POA: Diagnosis not present

## 2014-09-25 NOTE — Therapy (Signed)
[place] MAIN [facility] SERVICES [address] [place], [place], [address] Phone: [phone]   Fax:  [phone]  Physical Therapy Treatment  Patient Details  Name: Jamie Glass MRN: [id number] Date of Birth: Jan 04, 1928 Referring Provider:  Denton Lank, MD  Encounter Date: 09/25/2014      PT End of Session - 09/25/14 1703    Visit Number 5   Number of Visits 13   Date for PT Re-Evaluation 10/09/14   Authorization Type Gcodes 5   Authorization Time Period 10    PT Start Time 1620   PT Stop Time 1705   PT Time Calculation (min) 45 min   Activity Tolerance Patient limited by pain   Behavior During Therapy [facility] for tasks assessed/performed      Past Medical History  Diagnosis Date  . Anxiety   . Diabetes mellitus without complication   . Hypertension   . CHF (congestive heart failure)   . Arthritis   . Chronic kidney disease   . Stress headaches 08/21/2014    History reviewed. No pertinent past surgical history.  There were no vitals filed for this visit.  Visit Diagnosis:  Neck pain on right side  Stiffness of cervical spine      Subjective Assessment - 09/25/14 1627    Subjective Patient reports that she has had some neck pain today that has increaesd as the day has progressed.    Patient is accompained by: Family member   Pertinent History No surgies, no history of cancer, self reported visit to hospital for "heart stress"    How long can you sit comfortably? able to sit for a couple hours resulting in neck pain    How long can you stand comfortably? " a good while"    How long can you walk comfortably? walk about a hour before neck becomes problem    Diagnostic tests MRI: reveals muti-level cervical  stenosis from osteophytes.    Patient Stated Goals Increase ROM in neck and decrease pain in neck    Currently in Pain? Yes   Pain Score 3    Pain Location Neck   Pain Orientation Medial   Pain Descriptors / Indicators  Sore   Pain Type Chronic pain   Pain Onset More than a month ago   Pain Frequency Intermittent   Aggravating Factors  varies          treatment:   Cervical ROM in sitting R rotation x10 L rotation x10  L side bending x5  R side bending x5  Chin tucks x5   Cervical AROM in supine  R Rotation 2x10  L rotation 2x10  Chin tuck 3x10   Sitting postural exercise  Shoulder extension with yellow tband x10  Mid row yellow tband x10  High row yellow tband x10   Constant verbal instruction given to reduce forward head posture, proper speed of movement,  and to remain on task.   Manual therapy: PT performed suboccipital release 10 sec hold x4 reps; PT performed gentle cervical distraction 20 sec hold x6 reps; PT performed PROM of cervical spine in all directions; PT performed Trigger point release to L cervical paraspinals 60 second hold, x2 reps.   Patient re-instructed on the importance of the HEP to reduce pain and increase ROM.                      PT Education - 09/25/14 1657    Education provided Yes  Education Details reinforced the importance of HEP,    Person(s) Educated Patient;Child(ren)   Methods Demonstration;Explanation;Tactile cues;Verbal cues   Comprehension Verbalized understanding;Returned demonstration;Verbal cues required;Tactile cues required             PT Long Term Goals - 09/18/14 1640    PT LONG TERM GOAL #1   Title Pt will increase cervical rotation bilaterally by >15 degrees to allow increased safety with ADLs 10/09/14   Baseline 36 degrees   Time 6   Period Weeks   Status Partially Met   PT LONG TERM GOAL #2   Title Pt will increase cervical extension and flexion by >15 degrees to allow increased function with bathing 10/09/14   Baseline 25 degrees    Time 6   Period Weeks   Status Not Met   PT LONG TERM GOAL #3   Title Pt will decreased percieved impairment as noted by a >15% reduction of disability on the NDI. 10/09/14    Baseline 40% impaired   Time 6   Period Weeks   Status Not Met   PT LONG TERM GOAL #4   Title Pt will increase bilateral functional shoulder ROM to allow reach of low back and posterior contralateral shoulder to increase ease of bathing activities. 10/09/14   Time 6   Period Weeks   Status On-going               Plan - 09/25/14 1735    Clinical Impression Statement Patient reports to PT with increased L and medial neck pain on this day than previous sessions. Patient instructed in AROM cervical exercises, with increase in pain with bilateral rotation and side bending L>R. Manual therapy performed on cervical spine; reduced pain reported upon completion, increase ROM noted with R rotation and side bending. Continued skilled PT is recommended to decrease pain and improve function with ADLs.   Pt will benefit from skilled therapeutic intervention in order to improve on the following deficits Decreased activity tolerance;Decreased endurance;Decreased mobility;Decreased range of motion;Decreased safety awareness;Decreased strength;Hypomobility;Increased fascial restricitons;Impaired flexibility;Impaired sensation;Impaired UE functional use;Improper body mechanics;Pain   Rehab Potential Good   Clinical Impairments Affecting Rehab Potential Positives: motivated to decrease pain. Negative: co-morbidities, altered mentation.    PT Frequency 2x / week   PT Duration 6 weeks   PT Treatment/Interventions ADLs/Self Care Home Management;Cryotherapy;Electrical Stimulation;Iontophoresis 26m/ml Dexamethasone;Moist Heat;Traction;Ultrasound;Therapeutic activities;Therapeutic exercise;Gait training;Stair training;Balance training;Neuromuscular re-education;Patient/family education;Manual techniques;Passive range of motion;Dry needling   PT Next Visit Plan review chin tucks, work on manual therapy and ROM; postural exercises.    PT Home Exercise Plan continue as previously given   Consulted and Agree with  Plan of Care Patient        Problem List There are no active problems to display for this patient.  ABarrie FolkSPT 09/26/2014   8:41 AM  This entire session was performed under direct supervision and direction of a licensed therapist. I have personally read, edited and approve of the note as written.  Hopkins,Margaret , PT, DPT  09/26/2014, 8:41 AM  [place] [facility] [address] [address]Phone: [phone]  Fax:  [phone]

## 2014-09-26 ENCOUNTER — Ambulatory Visit: Payer: Medicare Other | Admitting: Physical Therapy

## 2014-09-26 ENCOUNTER — Encounter: Payer: Self-pay | Admitting: Physical Therapy

## 2014-09-26 DIAGNOSIS — M542 Cervicalgia: Secondary | ICD-10-CM

## 2014-09-26 DIAGNOSIS — M4802 Spinal stenosis, cervical region: Secondary | ICD-10-CM | POA: Diagnosis not present

## 2014-09-26 DIAGNOSIS — M436 Torticollis: Secondary | ICD-10-CM

## 2014-09-26 NOTE — Therapy (Signed)
[place] MAIN [facility] SERVICES [address] [place], [place], [address] Phone: [phone]   Fax:  [phone]  Physical Therapy Treatment  Patient Details  Name: Jamie Glass MRN: [id number] Date of Birth: 1928/01/04 Referring Provider:  Denton Lank, MD  Encounter Date: 09/26/2014      PT End of Session - 09/26/14 1612    Visit Number 6   Number of Visits 13   Date for PT Re-Evaluation 10/09/14   Authorization Type Gcodes 6   Authorization Time Period 10    PT Start Time 1519   PT Stop Time 1602   PT Time Calculation (min) 43 min   Activity Tolerance Patient limited by pain   Behavior During Therapy [facility] for tasks assessed/performed      Past Medical History  Diagnosis Date  . Anxiety   . Diabetes mellitus without complication   . Hypertension   . CHF (congestive heart failure)   . Arthritis   . Chronic kidney disease   . Stress headaches 08/21/2014    History reviewed. No pertinent past surgical history.  There were no vitals filed for this visit.  Visit Diagnosis:  Neck pain on right side  Stiffness of cervical spine      Subjective Assessment - 09/26/14 1541    Subjective Patient reports no pain in neck on this day and states that it is not as stiff as it has been the past couple of day.    Patient is accompained by: Family member   Pertinent History No surgies, no history of cancer, self reported visit to hospital for "heart stress"    How long can you sit comfortably? able to sit for a couple hours resulting in neck pain    How long can you stand comfortably? " a good while"    How long can you walk comfortably? walk about a hour before neck becomes problem    Diagnostic tests MRI: reveals muti-level cervical  stenosis from osteophytes.    Patient Stated Goals Increase ROM in neck and decrease pain in neck    Currently in Pain? No/denies   Pain Onset More than a month ago        Treatment   Arm bike  level 2, 2.5 minutes (unbilled)    Seated:  Cervical Rotation R and L 2x10  Cervical Side bending bilateral x8  Cervical Flexion/extension x10  Cervical R rotation with slight overpressure x 10  Seated Chin tucks x10   Seated therex  Shoulder extension with red tband x10  Mid row with red tband x10   supine chin tucks x10   For all therex, Constant verbal and tactile instruction required for increased end range range of motion, maintain focus on task, and proper exercise positioning with seated UE exercise.    PT performed suboccipital release 10 sec hold x3 reps; PT performed gentle cervical distraction 30 sec hold x5reps; PT performed PROM of cervical spine in all directions;  Patient re-instructed on the importance of the HEP to reduce pain and increase ROM.                         PT Education - 09/26/14 1611    Education provided Yes   Education Details Cervical AROM, manual therapy and postural exercises   Person(s) Educated Patient;Child(ren)   Methods Explanation;Demonstration;Tactile cues;Verbal cues   Comprehension Verbalized understanding;Returned demonstration;Verbal cues required;Tactile cues required  PT Long Term Goals - 09/18/14 1640    PT LONG TERM GOAL #1   Title Pt will increase cervical rotation bilaterally by >15 degrees to allow increased safety with ADLs 10/09/14   Baseline 36 degrees   Time 6   Period Weeks   Status Partially Met   PT LONG TERM GOAL #2   Title Pt will increase cervical extension and flexion by >15 degrees to allow increased function with bathing 10/09/14   Baseline 25 degrees    Time 6   Period Weeks   Status Not Met   PT LONG TERM GOAL #3   Title Pt will decreased percieved impairment as noted by a >15% reduction of disability on the NDI. 10/09/14   Baseline 40% impaired   Time 6   Period Weeks   Status Not Met   PT LONG TERM GOAL #4   Title Pt will increase bilateral functional shoulder ROM  to allow reach of low back and posterior contralateral shoulder to increase ease of bathing activities. 10/09/14   Time 6   Period Weeks   Status On-going               Plan - 09/26/14 1614    Clinical Impression Statement Patient reports no pain upon arrival to PT on this day. Patient instructed in Cervical AROM exercises and AROM exercises with over pressure, as well as postural exercises. Patient required constant instruction for proper exercises technique and to maintain focus on task, but patient demonstrates increased observational ROM and slightly better technique with all motions. Patient notes slight increase in pain with AROM exercises, but stated that it reduced to 0/10 with s/p manual therapy. Continued skilled PT is recommended to increase function with ADLs and decrease pain in neck.   Pt will benefit from skilled therapeutic intervention in order to improve on the following deficits Decreased activity tolerance;Decreased endurance;Decreased mobility;Decreased range of motion;Decreased safety awareness;Decreased strength;Hypomobility;Increased fascial restricitons;Impaired flexibility;Impaired sensation;Impaired UE functional use;Improper body mechanics;Pain   Rehab Potential Good   Clinical Impairments Affecting Rehab Potential Positives: motivated to decrease pain. Negative: co-morbidities, altered mentation.    PT Frequency 2x / week   PT Duration 6 weeks   PT Treatment/Interventions ADLs/Self Care Home Management;Cryotherapy;Electrical Stimulation;Iontophoresis 6m/ml Dexamethasone;Moist Heat;Traction;Ultrasound;Therapeutic activities;Therapeutic exercise;Gait training;Stair training;Balance training;Neuromuscular re-education;Patient/family education;Manual techniques;Passive range of motion;Dry needling   PT Next Visit Plan work on manual therapy and ROM; advance postural exercises.    PT Home Exercise Plan continue as previously given   Consulted and Agree with Plan of Care  Patient        Problem List There are no active problems to display for this patient.  ABarrie FolkSPT 09/26/2014   5:02 PM  This entire session was performed under direct supervision and direction of a licensed therapist . I have personally read, edited and approve of the note as written.  Hopkins,Margaret 09/26/2014, 5:02 PM  C[place]MAIN RSouthside HospitalSERVICES [address] [address]Phone: [phone]  Fax:  [phone]

## 2014-10-01 ENCOUNTER — Ambulatory Visit: Payer: Medicare Other | Admitting: Physical Therapy

## 2014-10-01 ENCOUNTER — Encounter: Payer: Self-pay | Admitting: Physical Therapy

## 2014-10-01 DIAGNOSIS — M436 Torticollis: Secondary | ICD-10-CM

## 2014-10-01 DIAGNOSIS — M4802 Spinal stenosis, cervical region: Secondary | ICD-10-CM | POA: Diagnosis not present

## 2014-10-01 DIAGNOSIS — M542 Cervicalgia: Secondary | ICD-10-CM

## 2014-10-01 NOTE — Therapy (Signed)
[place] MAIN [facility] SERVICES [address] [place], [place], [address] Phone: [phone]   Fax:  [phone]  Physical Therapy Treatment  Patient Details  Name: Jamie Glass MRN: [id number] Date of Birth: 11/26/1927 Referring Provider:  Madelyn Brunner, MD  Encounter Date: 10/01/2014      PT End of Session - 10/01/14 1717    Visit Number 7   Number of Visits 13   Date for PT Re-Evaluation 10/09/14   Authorization Type Gcodes 4   Authorization Time Period 10    PT Start Time 1515   PT Stop Time 1600   PT Time Calculation (min) 45 min   Activity Tolerance Patient limited by pain   Behavior During Therapy [facility] for tasks assessed/performed      Past Medical History  Diagnosis Date  . Anxiety   . Diabetes mellitus without complication   . Hypertension   . CHF (congestive heart failure)   . Arthritis   . Chronic kidney disease   . Stress headaches 08/21/2014    History reviewed. No pertinent past surgical history.  There were no vitals filed for this visit.  Visit Diagnosis:  Neck pain on right side  Stiffness of cervical spine      Subjective Assessment - 10/01/14 1524    Subjective Patient reports that she has no neck pain upon arrival to PT and states that her neck has been feeling better over the past week. She also states that she has been doing her HEP more regularly, this is confirmed by her daughter who is present at PT treatment.    Patient is accompained by: Family member   Pertinent History No surgies, no history of cancer, self reported visit to hospital for "heart stress"    How long can you sit comfortably? able to sit for a couple hours resulting in neck pain    How long can you stand comfortably? " a good while"    How long can you walk comfortably? walk about a hour before neck becomes problem    Diagnostic tests MRI: reveals muti-level cervical  stenosis from osteophytes.    Patient Stated Goals Increase  ROM in neck and decrease pain in neck    Currently in Pain? No/denies   Pain Onset More than a month ago        Treatment: Arm bike 3 minutes, level 2 (unbilled)    Seated therex   Shoulder extension with red tband 2x12  Low row with red tband 2x12 Mid row with red tband 2x12 Seated chin tucks 2x5   Constant verbal instruction to maintain proper exercise form, increase ROM, and increase scapular movement.   supine chin tucks x10   Supine cervical AROM x10  Supine Rotation and side bending. x10  Constant verbal and tactile instruction required for increased end range of motion, maintain focus on task, and increased movement with chin tucks. Slight increase in pain noted with cervical ROM exercises.    PT performed suboccipital release 10 sec hold x3 reps; PT performed gentle cervical distraction 30 sec hold x5reps; PT performed PROM of cervical spine in all directions;  Patient reports no pain following manual therapy                   PT Education - 10/01/14 1717    Education provided Yes   Education Details Cervical ROM, manual therapy, postural exercises   Person(s) Educated Patient   Methods Explanation;Demonstration;Tactile cues;Verbal cues  Comprehension Verbalized understanding;Returned demonstration;Verbal cues required;Tactile cues required;Need further instruction             PT Long Term Goals - 09/18/14 1640    PT LONG TERM GOAL #1   Title Pt will increase cervical rotation bilaterally by >15 degrees to allow increased safety with ADLs 10/09/14   Baseline 36 degrees   Time 6   Period Weeks   Status Partially Met   PT LONG TERM GOAL #2   Title Pt will increase cervical extension and flexion by >15 degrees to allow increased function with bathing 10/09/14   Baseline 25 degrees    Time 6   Period Weeks   Status Not Met   PT LONG TERM GOAL #3   Title Pt will decreased percieved impairment as noted by a >15% reduction of disability on the  NDI. 10/09/14   Baseline 40% impaired   Time 6   Period Weeks   Status Not Met   PT LONG TERM GOAL #4   Title Pt will increase bilateral functional shoulder ROM to allow reach of low back and posterior contralateral shoulder to increase ease of bathing activities. 10/09/14   Time 6   Period Weeks   Status On-going               Plan - 10/01/14 1718    Clinical Impression Statement Patient reports to PT on this day with no pain in neck or upper back. Patient instructed in cervical AROM, postural exercises and Manual therapy. Patient demonstrates difficulty with maintaining task on this day, required constant instruction/cues to maintain proper form.  Patient reports slight increase in pain with postural exercises and cervical ROM, but pain reduced to 0/10 s/p manual therapy. Continued skilled PT is recommended to improve cervical ROM, decrease pain and improve function with daily activities.    Pt will benefit from skilled therapeutic intervention in order to improve on the following deficits Decreased activity tolerance;Decreased endurance;Decreased mobility;Decreased range of motion;Decreased safety awareness;Decreased strength;Hypomobility;Increased fascial restricitons;Impaired flexibility;Impaired sensation;Impaired UE functional use;Improper body mechanics;Pain   Rehab Potential Good   Clinical Impairments Affecting Rehab Potential Positives: motivated to decrease pain. Negative: co-morbidities, altered mentation.    PT Frequency 2x / week   PT Duration 6 weeks   PT Treatment/Interventions ADLs/Self Care Home Management;Cryotherapy;Electrical Stimulation;Iontophoresis 17m/ml Dexamethasone;Moist Heat;Traction;Ultrasound;Therapeutic activities;Therapeutic exercise;Gait training;Stair training;Balance training;Neuromuscular re-education;Patient/family education;Manual techniques;Passive range of motion;Dry needling   PT Next Visit Plan advance postural exercises.  work on manual therapy and  ROM;    PT Home Exercise Plan continue as previously given   Consulted and Agree with Plan of Care Patient        Problem List There are no active problems to display for this patient.  ABarrie FolkSPT 10/02/2014   1:49 PM  This entire session was performed under direct supervision and direction of a licensed therapist . I have personally read, edited and approve of the note as written.  Hopkins,Margaret 10/02/2014, 1:49 PM  C[place]MAIN [facility]ERVICES [address] [address]Phone: [phone]  Fax:  [phone]

## 2014-10-02 ENCOUNTER — Encounter: Payer: Self-pay | Admitting: Physical Therapy

## 2014-10-02 ENCOUNTER — Ambulatory Visit: Payer: Medicare Other | Admitting: Physical Therapy

## 2014-10-02 DIAGNOSIS — M542 Cervicalgia: Secondary | ICD-10-CM

## 2014-10-02 DIAGNOSIS — M436 Torticollis: Secondary | ICD-10-CM

## 2014-10-02 DIAGNOSIS — M4802 Spinal stenosis, cervical region: Secondary | ICD-10-CM | POA: Diagnosis not present

## 2014-10-02 NOTE — Patient Instructions (Signed)
NECK TENSION: Assisted Stretch   Reach right arm around head and hold slightly above ear. Gently bring right ear toward right shoulder. Hold position for _8__ breaths. Repeat with other arm. Repeat _5__ times, alternating arms. Do _3__ times per day.  Copyright  VHI. All rights reserved.  Shoulder Roll   Move shoulders forward, up, back, then down. Continue circling shoulders backward _10__ times. Repeat, circling shoulders forward. Do __5_ times per day.  Copyright  VHI. All rights reserved.

## 2014-10-03 NOTE — Therapy (Signed)
[place] MAIN [facility] SERVICES [address] [place], [place], [address] Phone: [phone]   Fax:  [phone]  Physical Therapy Treatment  Patient Details  Name: Jamie Glass MRN: [id number] Date of Birth: 1927/03/21 Referring Provider:  Denton Lank, MD  Encounter Date: 10/02/2014      PT End of Session - 10/02/14 1716    Visit Number 8   Number of Visits 13   Date for PT Re-Evaluation 10/09/14   Authorization Type Gcodes 5   Authorization Time Period 10    PT Start Time 1623   PT Stop Time 1706   PT Time Calculation (min) 43 min   Equipment Utilized During Treatment Gait belt   Activity Tolerance Patient limited by pain   Behavior During Therapy [facility] for tasks assessed/performed      Past Medical History  Diagnosis Date  . Anxiety   . Diabetes mellitus without complication   . Hypertension   . CHF (congestive heart failure)   . Arthritis   . Chronic kidney disease   . Stress headaches 08/21/2014    History reviewed. No pertinent past surgical history.  There were no vitals filed for this visit.  Visit Diagnosis:  Neck pain on right side  Stiffness of cervical spine      Subjective Assessment - 10/02/14 1629    Subjective Patient reports no neck pain on this day. Also states that she feels that she can move her neck around more, than she could prior to the start of PT/   Patient is accompained by: Family member   Pertinent History No surgies, no history of cancer, self reported visit to hospital for "heart stress"    How long can you sit comfortably? able to sit for a couple hours resulting in neck pain    How long can you stand comfortably? " a good while"    How long can you walk comfortably? walk about a hour before neck becomes problem    Diagnostic tests MRI: reveals muti-level cervical  stenosis from osteophytes.    Patient Stated Goals Increase ROM in neck and decrease pain in neck    Currently in Pain?  No/denies   Pain Onset More than a month ago            [facility] PT Assessment - 10/02/14 1652    AROM   Cervical Flexion 35   Cervical Extension 25   Cervical - Right Side Bend 20   Cervical - Left Side Bend 24   Cervical - Right Rotation 40   Cervical - Left Rotation 43      Treatment:  Arm bike level 1.7, 3 minutes (unbilled)   Seated therex  Shoulder extension with red tband x12  Low row with red tband x12 Seated chin tucks 2x10  Seated side bending 2x10  Seated cervical ROM with overpressure 2x10   Constant verbal instruction to maintain proper exercise form, increase ROM, and increase scapular movement.   supine chin tucks 2x10  Supine cervical AROM x10  Supine Rotation x10  Constant verbal and tactile instruction required for increased end range of motion, maintain focus on task, and increased movement with chin tucks. Slight increase in pain noted with cervical ROM exercises.   PT performed suboccipital release 10 sec hold x3 reps; PT performed gentle cervical distraction 30 sec hold x5reps; PT performed PROM of cervical spine in bilateral side bending and rotation;  Patient reports no pain following manual therapy  PT Education - 10/02/14 1715    Education provided Yes   Education Details HEP advanced and re-educated. postural exercises,    Person(s) Educated Patient;Child(ren)   Methods Explanation;Demonstration;Verbal cues;Tactile cues   Comprehension Verbalized understanding;Returned demonstration;Verbal cues required;Tactile cues required;Need further instruction             PT Long Term Goals - 09/18/14 1640    PT LONG TERM GOAL #1   Title Pt will increase cervical rotation bilaterally by >15 degrees to allow increased safety with ADLs 10/09/14   Baseline 36 degrees   Time 6   Period Weeks   Status Partially Met   PT LONG TERM GOAL #2   Title Pt will increase cervical extension and flexion by >15  degrees to allow increased function with bathing 10/09/14   Baseline 25 degrees    Time 6   Period Weeks   Status Not Met   PT LONG TERM GOAL #3   Title Pt will decreased percieved impairment as noted by a >15% reduction of disability on the NDI. 10/09/14   Baseline 40% impaired   Time 6   Period Weeks   Status Not Met   PT LONG TERM GOAL #4   Title Pt will increase bilateral functional shoulder ROM to allow reach of low back and posterior contralateral shoulder to increase ease of bathing activities. 10/09/14   Time 6   Period Weeks   Status On-going               Plan - 10/02/14 1717    Clinical Impression Statement Patient presents to PT with no neck pain on this day. Patient demonstrates better posture on this day with decreased forward head and decreased rounded shoulders. Demonstrated increased difficulty following directions on this day and displayed difficulty with motor planning for bed mobility. PT instructed patient in postural exercises and cervical ROM exercises, constant verbal instruction needed to remain on task. Sight improvement of cervical ROM noted on this day.  Continued skilled PT is recommended to increase cervical ROM and decrease pain to allow greater function with ADLs.   Pt will benefit from skilled therapeutic intervention in order to improve on the following deficits Decreased activity tolerance;Decreased endurance;Decreased mobility;Decreased range of motion;Decreased safety awareness;Decreased strength;Hypomobility;Increased fascial restricitons;Impaired flexibility;Impaired sensation;Impaired UE functional use;Improper body mechanics;Pain   Rehab Potential Good   Clinical Impairments Affecting Rehab Potential Positives: motivated to decrease pain. Negative: co-morbidities, altered mentation.    PT Frequency 2x / week   PT Duration 6 weeks   PT Treatment/Interventions ADLs/Self Care Home Management;Cryotherapy;Electrical Stimulation;Iontophoresis 63m/ml  Dexamethasone;Moist Heat;Traction;Ultrasound;Therapeutic activities;Therapeutic exercise;Gait training;Stair training;Balance training;Neuromuscular re-education;Patient/family education;Manual techniques;Passive range of motion;Dry needling   PT Next Visit Plan work on manual therapy and ROM; and posture   PT Home Exercise Plan advanced - see patient instructions.    Consulted and Agree with Plan of Care Patient        Problem List There are no active problems to display for this patient.  ABarrie Folk SPT This entire session was performed under direct supervision and direction of a licensed therapist . I have personally read, edited and approve of the note as written.   Hopkins,Margaret 10/03/2014, 10:05 AM  [place] [facility] [address] [address]Phone: [phone]  Fax:  [phone]

## 2014-10-08 ENCOUNTER — Encounter: Payer: Self-pay | Admitting: Physical Therapy

## 2014-10-08 ENCOUNTER — Ambulatory Visit: Payer: Medicare Other | Attending: Family Medicine | Admitting: Physical Therapy

## 2014-10-08 DIAGNOSIS — M542 Cervicalgia: Secondary | ICD-10-CM | POA: Diagnosis not present

## 2014-10-08 DIAGNOSIS — M436 Torticollis: Secondary | ICD-10-CM | POA: Diagnosis present

## 2014-10-08 NOTE — Therapy (Signed)
[place] MAIN [facility] SERVICES [address] [place], [place], [address] Phone: [phone]   Fax:  [phone]  Physical Therapy Treatment/progress note  10/02/14 to 10/08/14  Patient Details  Name: Jamie Glass MRN: [id number] Date of Birth: [birth year] Referring Provider:  Denton Lank, MD  Encounter Date: 10/08/2014      PT End of Session - 10/08/14 1534    Visit Number 9   Number of Visits 18   Date for PT Re-Evaluation 11/06/14   Authorization Type Gcodes 1   Authorization Time Period 10    PT Start Time 1432   PT Stop Time 1520   PT Time Calculation (min) 48 min   Equipment Utilized During Treatment Gait belt   Activity Tolerance Patient limited by pain;Patient tolerated treatment well   Behavior During Therapy [facility] for tasks assessed/performed      Past Medical History  Diagnosis Date  . Anxiety   . Diabetes mellitus without complication   . Hypertension   . CHF (congestive heart failure)   . Arthritis   . Chronic kidney disease   . Stress headaches 08/21/2014    History reviewed. No pertinent past surgical history.  There were no vitals filed for this visit.  Visit Diagnosis:  Neck pain on right side  Stiffness of cervical spine      Subjective Assessment - 10/08/14 1450    Subjective Patient states that she is feeling better with more motion in her neck and no pain reported upon arrival to PT.    Patient is accompained by: Family member   Pertinent History No surgies, no history of cancer, self reported visit to hospital for "heart stress"    How long can you sit comfortably? able to sit for a couple hours resulting in neck pain    How long can you stand comfortably? " a good while"    How long can you walk comfortably? walk about a hour before neck becomes problem    Diagnostic tests MRI: reveals muti-level cervical  stenosis from osteophytes.    Patient Stated Goals Increase ROM in neck and decrease pain in neck     Currently in Pain? No/denies   Pain Onset More than a month ago            [facility] PT Assessment - 10/08/14 1452    Observation/Other Assessments   Neck Disability Index  22% impaired   improved from 10/02/14: 40% impaired   AROM   Cervical Flexion 34  10/02/14: 35 degrees   Cervical Extension 40  10/02/14: 25 degrees   Cervical - Right Side Bend 22  10/02/14: 20 degrees   Cervical - Left Side Bend 30  10/02/14: 24 degrees   Cervical - Right Rotation 45  10/02/14 40 degrees   Cervical - Left Rotation 38  7:27/16: 43 degrees       Nustep 3 minutes (unbilled)  PT goals assessed on this day. Patient demonstrates approximately 10 degrees of improve ROM with R rotation, side bending, and flexion.  Following exercise:  Patient assisted by PT for the completion of the NDI. Pt demonstrated decreased attention and needed to be refocused several times. (therapeutic activity) Prior to therapeutic activity: Seated therex  Cervical rotation x10 each direction  Cervical extension/flexion x10  Chin tucks x10   Supine therex Cervical rotation x10  Chin tucks x 5 For all therex, patient required mod-max verbal and tactile instruction for continued participation with exercise on this day and  for increased end range movement. Patient reports pain at end range R rotation.    PT performed suboccipital release 20 sec hold x3 reps; PT performed gentle cervical distraction 30 sec hold x3reps; PT performed PROM of cervical spine in rotation and side bending;                  PT Education - 10/08/14 1804    Education provided Yes   Education Details cervical ROM continued Plan of care    Person(s) Educated Patient;Child(ren)   Methods Explanation;Demonstration;Tactile cues;Verbal cues   Comprehension Verbalized understanding;Returned demonstration;Verbal cues required;Tactile cues required             PT Long Term Goals - 10/08/14 1504    PT LONG TERM GOAL #1   Title  Pt will increase cervical rotation bilaterally by >15 degrees to allow increased safety with ADLs 11-08-2014   Baseline 36 degrees   Time 6   Period Weeks   Status Partially Met   PT LONG TERM GOAL #2   Title Pt will increase cervical extension and flexion by >15 degrees to allow increased function with bathing 08-Nov-2014   Baseline 25 degrees    Time 6   Period Weeks   Status Partially Met   PT LONG TERM GOAL #3   Title Pt will decreased percieved impairment as noted by a >15% reduction of disability on the NDI. November 08, 2014   Baseline 40% impaired   Time 6   Period Weeks   Status Achieved   PT LONG TERM GOAL #4   Title Pt will increase bilateral functional shoulder ROM to allow reach of low back and posterior contralateral shoulder to increase ease of bathing activities. November 08, 2014   Time 6   Period Weeks   Status On-going               Plan - 10/08/14 1540    Clinical Impression Statement Patient Goals assessed on this day. Patient demonstrated improved cervical ROM on this day from initial evaluation on 08/22/2014 with approx 10 degrees of improvement with rotation, flexion/extension, and side bending. and decreased perceived impairment demonstrated by improvement of NDI to 22% impaired from 40% impaired. No improvement noted with  bilateral shoulder motion for functional use. PT performed manual therapy on this day with decreased pain reported by patient. Cervical AROM exercises performed on this day, patient continues to not increase in pain on R with rotation or side bending to the L. Continued skilled PT is recommended to improve cervical ROM, decrease pain, and improve functional use of UE ADLs.   Pt will benefit from skilled therapeutic intervention in order to improve on the following deficits Decreased activity tolerance;Decreased endurance;Decreased mobility;Decreased range of motion;Decreased safety awareness;Decreased strength;Hypomobility;Increased fascial restricitons;Impaired  flexibility;Impaired sensation;Impaired UE functional use;Improper body mechanics;Pain   Rehab Potential Good   Clinical Impairments Affecting Rehab Potential Positives: motivated to decrease pain. Negative: co-morbidities, altered mentation.    PT Frequency 2x / week   PT Duration 4 weeks   PT Treatment/Interventions ADLs/Self Care Home Management;Cryotherapy;Electrical Stimulation;Iontophoresis 49m/ml Dexamethasone;Moist Heat;Traction;Ultrasound;Therapeutic activities;Therapeutic exercise;Gait training;Stair training;Balance training;Neuromuscular re-education;Patient/family education;Manual techniques;Passive range of motion;Dry needling   PT Next Visit Plan work on manual therapy and ROM; and posture   PT Home Exercise Plan continue as previously given.    Consulted and Agree with Plan of Care Patient          G-Codes - 009/02/161405    Functional Assessment Tool Used cervical ROM, clinical judgement  Functional Limitation Self care   Self Care Current Status [phone]) At least 20 percent but less than 40 percent impaired, limited or restricted   Self Care Goal Status (Z4271) At least 1 percent but less than 20 percent impaired, limited or restricted      Problem List There are no active problems to display for this patient. Barrie Folk, SPT This entire session was performed under direct supervision and direction of a licensed therapist . I have personally read, edited and approve of the note as written.   Hopkins,Margaret 10/09/2014, 2:06 PM  [place] MAIN [facility] SERVICES [address] [place], [place], [address] Phone: [phone]   Fax:  [phone]

## 2014-10-09 DIAGNOSIS — M542 Cervicalgia: Secondary | ICD-10-CM | POA: Diagnosis not present

## 2014-10-10 ENCOUNTER — Ambulatory Visit: Payer: Medicare Other | Admitting: Physical Therapy

## 2014-10-10 ENCOUNTER — Encounter: Payer: Self-pay | Admitting: Physical Therapy

## 2014-10-10 DIAGNOSIS — M542 Cervicalgia: Secondary | ICD-10-CM | POA: Diagnosis not present

## 2014-10-10 DIAGNOSIS — M436 Torticollis: Secondary | ICD-10-CM

## 2014-10-10 NOTE — Therapy (Signed)
[place] MAIN [facility] SERVICES [address] [place], [place], [address] Phone: [phone]   Fax:  [phone]  Physical Therapy Treatment  Patient Details  Name: Jamie Glass MRN: [id number] Date of Birth: 1927/03/24 Referring Provider:  Denton Lank, MD  Encounter Date: 10/10/2014      PT End of Session - 10/10/14 1539    Visit Number 10   Number of Visits 18   Date for PT Re-Evaluation 11/06/14   Authorization Type Gcodes 2   Authorization Time Period 10    PT Start Time 1430   PT Stop Time 1515   PT Time Calculation (min) 45 min   Equipment Utilized During Treatment Gait belt   Activity Tolerance Patient limited by pain;Patient tolerated treatment well   Behavior During Therapy [facility] for tasks assessed/performed      Past Medical History  Diagnosis Date  . Anxiety   . Diabetes mellitus without complication   . Hypertension   . CHF (congestive heart failure)   . Arthritis   . Chronic kidney disease   . Stress headaches 08/21/2014    History reviewed. No pertinent past surgical history.  There were no vitals filed for this visit.  Visit Diagnosis:  Neck pain on right side  Stiffness of cervical spine      Subjective Assessment - 10/10/14 1437    Subjective Patient states that she is doing well on this day. No neck or upper back pain noted upon arrival to PT.    Patient is accompained by: Family member   Pertinent History No surgies, no history of cancer, self reported visit to hospital for "heart stress"    How long can you sit comfortably? able to sit for a couple hours resulting in neck pain    How long can you stand comfortably? " a good while"    How long can you walk comfortably? walk about a hour before neck becomes problem    Diagnostic tests MRI: reveals muti-level cervical  stenosis from osteophytes.    Patient Stated Goals Increase ROM in neck and decrease pain in neck    Currently in Pain? No/denies   Pain  Onset More than a month ago      Treatment:  Nustep 3 minutes, level 1, (unbilled)     Seated therex  Shoulder IR with yellow tband 2x12  Low row with red tband 2x12 Mid row with red tband 2x12 Seated chin tucks 2x5  Seated cervical rotation 2x8 Cervical lateral flexion 2x8  Constant verbal instruction to maintain proper exercise form, increase ROM, and increase scapular movement.   supine chin tucks x10  Supine cervical AROM x10  Supine Rotation and side bending. x10  Constant verbal and tactile instruction required for increased end range of motion, maintain focus on task, and increased movement with chin tucks. Slight increase in pain noted with cervical ROM exercises.   PT performed grade III mobilization with movement in sitting and supine with L rotation x15 in each position.  PT performed suboccipital release 10 sec hold x3 reps; PT performed PROM of cervical spine in all directions; PT performed gentle PROM for shoulder internal Rotation x20 seconds x3  PT performed inferior grade III mobilizations of GH joint at 90 degrees of abduction.   Patient reports no pain following manual therapy and increased motion with shoulder movement  PT Education - 10/10/14 1539    Education provided Yes   Education Details Cervical ROM, shoulder ROM and strengthening   Person(s) Educated Patient   Methods Explanation;Demonstration;Verbal cues;Tactile cues   Comprehension Verbalized understanding;Returned demonstration;Tactile cues required;Verbal cues required             PT Long Term Goals - 10/08/14 1504    PT LONG TERM GOAL #1   Title Pt will increase cervical rotation bilaterally by >15 degrees to allow increased safety with ADLs 2014/10/28   Baseline 36 degrees   Time 6   Period Weeks   Status Partially Met   PT LONG TERM GOAL #2   Title Pt will increase cervical extension and flexion by >15 degrees to allow increased function  with bathing 10/28/14   Baseline 25 degrees    Time 6   Period Weeks   Status Partially Met   PT LONG TERM GOAL #3   Title Pt will decreased percieved impairment as noted by a >15% reduction of disability on the NDI. Oct 28, 2014   Baseline 40% impaired   Time 6   Period Weeks   Status Achieved   PT LONG TERM GOAL #4   Title Pt will increase bilateral functional shoulder ROM to allow reach of low back and posterior contralateral shoulder to increase ease of bathing activities. 10-28-14   Time 6   Period Weeks   Status On-going               Plan - 10/10/14 1548    Clinical Impression Statement Patient instructed in UE strengthening on this day as well as cervical and shoulder ROM. Pt required constant verbal and tactile instruction with all exercises on this day, but she demonstrates increase participation with exercises and required less cuing to remain on task. PT performed rotational mobilization with movement on the R C4-5 region with L cervical rotation; patient reports decreased pain with rotation following mobilization with movement. PT also performed manual therapy to Cervical spine and to bilateral shoulders on this day; increased motion noted after manual therapy. Continued skilled PT is recommended to improve shoulder and cervical ROM to improve function and reduce pain with ADLs.     Pt will benefit from skilled therapeutic intervention in order to improve on the following deficits Decreased activity tolerance;Decreased endurance;Decreased mobility;Decreased range of motion;Decreased safety awareness;Decreased strength;Hypomobility;Increased fascial restricitons;Impaired flexibility;Impaired sensation;Impaired UE functional use;Improper body mechanics;Pain   Rehab Potential Good   Clinical Impairments Affecting Rehab Potential Positives: motivated to decrease pain. Negative: co-morbidities, altered mentation.    PT Frequency Biweekly   PT Duration 4 weeks   PT  Treatment/Interventions ADLs/Self Care Home Management;Cryotherapy;Electrical Stimulation;Iontophoresis 23m/ml Dexamethasone;Moist Heat;Traction;Ultrasound;Therapeutic activities;Therapeutic exercise;Gait training;Stair training;Balance training;Neuromuscular re-education;Patient/family education;Manual techniques;Passive range of motion;Dry needling   PT Next Visit Plan work on manual therapy and ROM; and posture   PT Home Exercise Plan continue as previously given.    Consulted and Agree with Plan of Care Patient          G-Codes - 02016/08/221405    Functional Assessment Tool Used cervical ROM, clinical judgement   Functional Limitation Self care   Self Care Current Status ((Z6109 At least 20 percent but less than 40 percent impaired, limited or restricted   Self Care Goal Status (([id number] At least 1 percent but less than 20 percent impaired, limited or restricted      Problem List There are no active problems to display for this patient.  ABarrie FolkSPT 10/10/2014  5:20 PM  This entire session was performed under direct supervision and direction of a licensed therapist. I have personally read, edited and approve of the note as written.   Hopkins,Margaret 10/10/2014, 5:20 PM  [place] MAIN [facility] SERVICES [address] [place], [place], [address] Phone: [phone]   Fax:  [phone]

## 2014-10-15 ENCOUNTER — Encounter: Payer: Self-pay | Admitting: Physical Therapy

## 2014-10-15 ENCOUNTER — Ambulatory Visit: Payer: Medicare Other | Admitting: Physical Therapy

## 2014-10-15 DIAGNOSIS — M542 Cervicalgia: Secondary | ICD-10-CM | POA: Diagnosis not present

## 2014-10-15 DIAGNOSIS — M436 Torticollis: Secondary | ICD-10-CM

## 2014-10-15 NOTE — Therapy (Signed)
[place] MAIN [facility] SERVICES [address] [place], [place], [address] Phone: [phone]   Fax:  [phone]  Physical Therapy Treatment  Patient Details  Name: Jamie Glass MRN: [id number] Date of Birth: 1927/06/21 Referring Provider:  Denton Lank, MD  Encounter Date: 10/15/2014      PT End of Session - 10/15/14 1657    Visit Number 11   Number of Visits 18   Date for PT Re-Evaluation 11/06/14   Authorization Type Gcodes 3   Authorization Time Period 10    PT Start Time 7824   PT Stop Time 1632   PT Time Calculation (min) 41 min   Activity Tolerance Patient tolerated treatment well;No increased pain   Behavior During Therapy [facility] for tasks assessed/performed      Past Medical History  Diagnosis Date  . Anxiety   . Diabetes mellitus without complication   . Hypertension   . CHF (congestive heart failure)   . Arthritis   . Chronic kidney disease   . Stress headaches 08/21/2014    History reviewed. No pertinent past surgical history.  There were no vitals filed for this visit.  Visit Diagnosis:  Neck pain on right side  Stiffness of cervical spine    Treatment:  Nustep level 2, x3 minutes,(unbilled)     Seated therex  Cervical AROM: flexion/extension x5, rotation bilaterally x5 each with tactile cues to increase end range for increased flexibility; BUE red tband shoulder extension x10 BUE red tband shoulder rows x15 bilaterally (unilateral) as patient required frequent tactile and verbal cues to avoid elbow flexion and increase shoulder extension during rows for postural strengthening; BUE shoulder horizontal abduction AROM 2x4 with cues for positioning. Patient reports increased fatigue after 3 reps having difficulty maintaining shoulder position during abduction; UE red tband shoulder IR resisted x15 bilaterally with moderate tactile/verbal cues for positioning including to keep elbow by side and to avoid painful  ROM; BUE wand flexion with 2# ankle weight x10;  Patient reports increased fatigue after exercise. However she was able to demonstrate better flexibility and postural control following instruction.                            PT Education - 10/15/14 1657    Education provided Yes   Education Details exercise progression   Person(s) Educated Patient   Methods Explanation;Tactile cues;Verbal cues;Demonstration   Comprehension Verbalized understanding;Returned demonstration;Verbal cues required;Tactile cues required             PT Long Term Goals - 10/08/14 1504    PT LONG TERM GOAL #1   Title Pt will increase cervical rotation bilaterally by >15 degrees to allow increased safety with ADLs 10/09/14   Baseline 36 degrees   Time 6   Period Weeks   Status Partially Met   PT LONG TERM GOAL #2   Title Pt will increase cervical extension and flexion by >15 degrees to allow increased function with bathing 10/09/14   Baseline 25 degrees    Time 6   Period Weeks   Status Partially Met   PT LONG TERM GOAL #3   Title Pt will decreased percieved impairment as noted by a >15% reduction of disability on the NDI. 10/09/14   Baseline 40% impaired   Time 6   Period Weeks   Status Achieved   PT LONG TERM GOAL #4   Title Pt will increase bilateral functional shoulder ROM to  allow reach of low back and posterior contralateral shoulder to increase ease of bathing activities. 10/09/14   Time 6   Period Weeks   Status On-going               Plan - 10/15/14 1658    Clinical Impression Statement Patient was instructed in BUE strengthening and ROM. She was able to demonstrate better cervical ROM and exercise tolerance with verbal/tactile cues. Patient continues to require frequent cue for better positioning during UE exercise to facilitate better thoracic/scapular strengthening. Especially with rows. Patient reports no increase in pain following exercise. She would benefit from  additional skilled PT intervention to improve postural strength, reduce neck pain and increase flexibility.    Pt will benefit from skilled therapeutic intervention in order to improve on the following deficits Decreased activity tolerance;Decreased endurance;Decreased mobility;Decreased range of motion;Decreased safety awareness;Decreased strength;Hypomobility;Increased fascial restricitons;Impaired flexibility;Impaired sensation;Impaired UE functional use;Improper body mechanics;Pain   Rehab Potential Good   Clinical Impairments Affecting Rehab Potential Positives: motivated to decrease pain. Negative: co-morbidities, altered mentation.    PT Frequency Biweekly   PT Duration 4 weeks   PT Treatment/Interventions ADLs/Self Care Home Management;Cryotherapy;Electrical Stimulation;Iontophoresis 59m/ml Dexamethasone;Moist Heat;Traction;Ultrasound;Therapeutic activities;Therapeutic exercise;Gait training;Stair training;Balance training;Neuromuscular re-education;Patient/family education;Manual techniques;Passive range of motion;Dry needling   PT Next Visit Plan work on manual therapy and ROM; and posture   PT Home Exercise Plan continue as previously given.    Consulted and Agree with Plan of Care Patient        Problem List There are no active problems to display for this patient.   Hopkins,Luree Palla, PT, DPT 10/15/2014, 4:59 PM  [place] [facility] [address] [address]Phone: [phone]  Fax:  [phone]

## 2014-10-16 ENCOUNTER — Encounter: Payer: Self-pay | Admitting: Physical Therapy

## 2014-10-16 ENCOUNTER — Ambulatory Visit: Payer: Medicare Other | Admitting: Physical Therapy

## 2014-10-16 DIAGNOSIS — M542 Cervicalgia: Secondary | ICD-10-CM | POA: Diagnosis not present

## 2014-10-16 DIAGNOSIS — M436 Torticollis: Secondary | ICD-10-CM

## 2014-10-16 NOTE — Therapy (Signed)
[place] MAIN [facility] SERVICES [address] [place], [place], [address] Phone: [phone]   Fax:  [phone]  Physical Therapy Treatment  Patient Details  Name: Jamie Glass MRN: [id number] Date of Birth: Jan 13, 1928 Referring Provider:  Denton Lank, MD  Encounter Date: 10/16/2014      PT End of Session - 10/16/14 1647    Visit Number 12   Number of Visits 18   Date for PT Re-Evaluation 11/06/14   Authorization Type Gcodes 4   Authorization Time Period 10    PT Start Time 1536   PT Stop Time 1629   PT Time Calculation (min) 53 min   Activity Tolerance Patient tolerated treatment well;No increased pain   Behavior During Therapy [facility] for tasks assessed/performed      Past Medical History  Diagnosis Date  . Anxiety   . Diabetes mellitus without complication   . Hypertension   . CHF (congestive heart failure)   . Arthritis   . Chronic kidney disease   . Stress headaches 08/21/2014    History reviewed. No pertinent past surgical history.  There were no vitals filed for this visit.  Visit Diagnosis:  Neck pain on right side  Stiffness of cervical spine      Subjective Assessment - 10/16/14 1542    Subjective Patient reports that she does not have any neck pain today or any soreness in arms, back, or neck from previous PT sessions.    Patient is accompained by: Family member   Pertinent History No surgies, no history of cancer, self reported visit to hospital for "heart stress"    How long can you sit comfortably? able to sit for a couple hours resulting in neck pain    How long can you stand comfortably? " a good while"    How long can you walk comfortably? walk about a hour before neck becomes problem    Diagnostic tests MRI: reveals muti-level cervical  stenosis from osteophytes.    Patient Stated Goals Increase ROM in neck and decrease pain in neck    Currently in Pain? No/denies   Pain Onset More than a month ago       Treatment:  Nustep level 2, x3 minutes,(unbilled)    Seated therex  Cervical AROM: flexion/extension 2x6, rotation bilaterally 2x6, lateral flexion x6 bilaterally each with tactile cues to increase end range for increased flexibility; BUE red tband shoulder extension 2x10, constant tactile instruction for proper shoulder motion.  BUE red tband shoulder rows 2x10,  patient required frequent tactile and verbal cues to avoid elbow flexion and increase shoulder extension during rows for postural strengthening; BUE shoulder horizontal abduction AROM 2x4 with cues for positioning. Patient reports increased fatigue and pain in the R shoulder after 3 reps on each set.  UE red tband shoulder IR resisted 2x10 bilaterally with moderate tactile/verbal cues for positioning including to keep elbow by side and to avoid painful ROM; UE red tand shoulder ER resisted 2x10 bilaterally, moderate verbal and and tactile instruction for proper UE positioning and to perform motion in a pain free range.  BUE wand flexion with 2# ankle weight 2x10;   Patient reports increased bilateral shoulder fatigue following therapeutic exercise.                           PT Education - 10/16/14 1646    Education Details shoulder, cervical, and postural exercises.    Person(s) Educated  Patient   Methods Explanation;Demonstration;Tactile cues;Verbal cues   Comprehension Verbalized understanding;Returned demonstration;Verbal cues required;Tactile cues required             PT Long Term Goals - 10/08/14 1504    PT LONG TERM GOAL #1   Title Pt will increase cervical rotation bilaterally by >15 degrees to allow increased safety with ADLs 10/09/14   Baseline 36 degrees   Time 6   Period Weeks   Status Partially Met   PT LONG TERM GOAL #2   Title Pt will increase cervical extension and flexion by >15 degrees to allow increased function with bathing 10/09/14   Baseline 25 degrees    Time 6   Period  Weeks   Status Partially Met   PT LONG TERM GOAL #3   Title Pt will decreased percieved impairment as noted by a >15% reduction of disability on the NDI. 10/09/14   Baseline 40% impaired   Time 6   Period Weeks   Status Achieved   PT LONG TERM GOAL #4   Title Pt will increase bilateral functional shoulder ROM to allow reach of low back and posterior contralateral shoulder to increase ease of bathing activities. 10/09/14   Time 6   Period Weeks   Status On-going               Plan - 10/16/14 1648    Clinical Impression Statement PT instructed patient in bilateral UE strengthening and ROM exercise, as well as cervical ROM exercises. Patient was able to perform shoulder strengthening exercises including internal and external rotation with greater control and increased ROM. Grossly, patient demonstrates increase cervical  flexion/extension with less pain. Continued difficulty noted with end range cervical rotation and sustained shoulder abduction R.>L.  This patient would benefit from skilled PT in order to increase cervical ROM, decrease pain, and increase function with daily tasks.    Pt will benefit from skilled therapeutic intervention in order to improve on the following deficits Decreased activity tolerance;Decreased endurance;Decreased mobility;Decreased range of motion;Decreased safety awareness;Decreased strength;Hypomobility;Increased fascial restricitons;Impaired flexibility;Impaired sensation;Impaired UE functional use;Improper body mechanics;Pain   Rehab Potential Good   Clinical Impairments Affecting Rehab Potential Positives: motivated to decrease pain. Negative: co-morbidities, altered mentation.    PT Frequency Biweekly   PT Duration 4 weeks   PT Treatment/Interventions ADLs/Self Care Home Management;Cryotherapy;Electrical Stimulation;Iontophoresis 46m/ml Dexamethasone;Moist Heat;Traction;Ultrasound;Therapeutic activities;Therapeutic exercise;Gait training;Stair  training;Balance training;Neuromuscular re-education;Patient/family education;Manual techniques;Passive range of motion;Dry needling   PT Next Visit Plan work on manual therapy and ROM; and posture   PT Home Exercise Plan continue as previously given.    Consulted and Agree with Plan of Care Patient        Problem List There are no active problems to display for this patient.  ABarrie FolkSPT 10/17/2014   9:52 AM  This entire session was performed under direct supervision and direction of a licensed therapist. I have personally read, edited and approve of the note as written.  Hopkins,Margaret, PT, DPT 10/17/2014, 9:52 AM  CSt[facility] FrancisMAIN [facility]Community [facility] HospitalSERVICES [address] [address]Phone: [phone]  Fax:  [phone]

## 2014-10-22 ENCOUNTER — Ambulatory Visit: Payer: Medicare Other | Admitting: Physical Therapy

## 2014-10-22 ENCOUNTER — Encounter: Payer: Self-pay | Admitting: Physical Therapy

## 2014-10-22 DIAGNOSIS — M436 Torticollis: Secondary | ICD-10-CM

## 2014-10-22 DIAGNOSIS — M542 Cervicalgia: Secondary | ICD-10-CM

## 2014-10-22 NOTE — Therapy (Signed)
[place] MAIN [facility] SERVICES [address] [place], [place], [address] Phone: [phone]   Fax:  [phone]  Physical Therapy Treatment  Patient Details  Name: Jamie Glass MRN: [id number] Date of Birth: 04/07/1927 Referring Provider:  Denton Lank, MD  Encounter Date: 10/22/2014      PT End of Session - 10/22/14 1726    Visit Number 13   Number of Visits 18   Date for PT Re-Evaluation 11/06/14   Authorization Type Gcodes 5   Authorization Time Period 10    PT Start Time 1520   PT Stop Time 1602   PT Time Calculation (min) 42 min   Activity Tolerance Patient tolerated treatment well;No increased pain   Behavior During Therapy [facility] for tasks assessed/performed      Past Medical History  Diagnosis Date  . Anxiety   . Diabetes mellitus without complication   . Hypertension   . CHF (congestive heart failure)   . Arthritis   . Chronic kidney disease   . Stress headaches 08/21/2014    History reviewed. No pertinent past surgical history.  There were no vitals filed for this visit.  Visit Diagnosis:  Neck pain on right side  Stiffness of cervical spine      Subjective Assessment - 10/22/14 1532    Subjective Patient reports that she had a good weekend, that was mostly pain free. She states that she has no pain in her shoulder or neck upon arrival to PT.    Patient is accompained by: Family member   Pertinent History No surgeries, no history of cancer, self reported visit to hospital for "heart stress"    How long can you sit comfortably? able to sit for a couple hours resulting in neck pain    How long can you stand comfortably? " a good while"    How long can you walk comfortably? walk about a hour before neck becomes problem    Diagnostic tests MRI: reveals muti-level cervical  stenosis from osteophytes.    Patient Stated Goals Increase ROM in neck and decrease pain in neck    Currently in Pain? No/denies   Pain Onset  More than a month ago     Treatment :  Nustep level 2 3 minutes (unbilled)    Seated therex  Cervical AROM: flexion/extension 2x10, rotation bilaterally 2x10,  verbal cues to increase end range for increased flexibility;  Seated therex with red tband  BUE shoulder extension 2x12, constant tactile instruction for proper shoulder motion.  BUE shoulder mid rows 2x12, patient required frequent tactile and verbal cues to avoid elbow flexion and increase shoulder extension during rows for postural strengthening; BUE shoulder high row 2x12 BUE shoulder IR resisted 2x10  Constant tactile/verbal cues for positioning including to keep elbow by side and to avoid painful ROM with IR/ER;  Patient reports increased bilateral shoulder fatigue following therapeutic exercise, Patient demonstrates the ability to reach her low back with B UE and able to reach her upper back with the L UE, unable on the R due to pain in the R shoulder                                  PT Education - 10/22/14 1533    Education provided Yes   Education Details Shoulder strengthening, Cervical ROM, postural exercises.    Person(s) Educated Patient   Methods Explanation;Demonstration;Tactile cues;Verbal cues  Comprehension Verbalized understanding;Returned demonstration;Verbal cues required;Tactile cues required             PT Long Term Goals - 10/08/14 1504    PT LONG TERM GOAL #1   Title Pt will increase cervical rotation bilaterally by >15 degrees to allow increased safety with ADLs 10/09/14   Baseline 36 degrees   Time 6   Period Weeks   Status Partially Met   PT LONG TERM GOAL #2   Title Pt will increase cervical extension and flexion by >15 degrees to allow increased function with bathing 10/09/14   Baseline 25 degrees    Time 6   Period Weeks   Status Partially Met   PT LONG TERM GOAL #3   Title Pt will decreased percieved impairment as noted by a >15% reduction of  disability on the NDI. 10/09/14   Baseline 40% impaired   Time 6   Period Weeks   Status Achieved   PT LONG TERM GOAL #4   Title Pt will increase bilateral functional shoulder ROM to allow reach of low back and posterior contralateral shoulder to increase ease of bathing activities. 10/09/14   Time 6   Period Weeks   Status On-going               Plan - 10/22/14 1727    Clinical Impression Statement PT instructed Patient in Bilateral UE Strengthening exercises as well as cervial ROM exercises. PT required to provide constant tactile and verbal feedback to increase proper exercise technique and decrease accessory motions at the elbow and in the trunk. Patient demonstrated moderate response to instruction.  Patient demonstrates increased  flexion and extension by gross assessment. Patient able to reach to low back with Bilateral UE on this day, slight increase in pain with the R LE. Patient unable to reach upper back with R LE. Continued skilled PT is recommended to increase cervical and shoulder ROM as well as increase strength to improve function with daily tasks.    Pt will benefit from skilled therapeutic intervention in order to improve on the following deficits Decreased activity tolerance;Decreased endurance;Decreased mobility;Decreased range of motion;Decreased safety awareness;Decreased strength;Hypomobility;Increased fascial restricitons;Impaired flexibility;Impaired sensation;Impaired UE functional use;Improper body mechanics;Pain   Rehab Potential Good   Clinical Impairments Affecting Rehab Potential Positives: motivated to decrease pain. Negative: co-morbidities, altered mentation.    PT Frequency Biweekly   PT Duration 4 weeks   PT Treatment/Interventions ADLs/Self Care Home Management;Cryotherapy;Electrical Stimulation;Iontophoresis 49m/ml Dexamethasone;Moist Heat;Traction;Ultrasound;Therapeutic activities;Therapeutic exercise;Gait training;Stair training;Balance  training;Neuromuscular re-education;Patient/family education;Manual techniques;Passive range of motion;Dry needling   PT Next Visit Plan work on manual therapy and ROM; and posture   PT Home Exercise Plan continue as previously given.    Consulted and Agree with Plan of Care Patient        Problem List There are no active problems to display for this patient.  ABarrie FolkSPT 10/23/2014   1:37 PM  This entire session was performed under direct supervision and direction of a licensed therapist . I have personally read, edited and approve of the note as written.  Hopkins,Margaret, PT, DPT 10/23/2014, 1:37 PM  CH[place]MAIN RAc[facility] BelmontSERVICES [address] [address]Phone: [phone]  Fax:  [phone]

## 2014-10-24 ENCOUNTER — Encounter: Payer: Self-pay | Admitting: Physical Therapy

## 2014-10-24 ENCOUNTER — Ambulatory Visit: Payer: Medicare Other | Admitting: Physical Therapy

## 2014-10-24 DIAGNOSIS — M542 Cervicalgia: Secondary | ICD-10-CM

## 2014-10-24 DIAGNOSIS — M436 Torticollis: Secondary | ICD-10-CM

## 2014-10-25 NOTE — Therapy (Signed)
[place] MAIN [facility] SERVICES [address] [place], [place], [address] Phone: [phone]   Fax:  [phone]  Physical Therapy Treatment  Patient Details  Name: Jamie Glass MRN: [id number] Date of Birth: 21-Nov-1927 Referring Provider:  Denton Lank, MD  Encounter Date: 10/24/2014      PT End of Session - 10/24/14 1540    Visit Number 14   Number of Visits 18   Date for PT Re-Evaluation 11/06/14   Authorization Type Gcodes 6   Authorization Time Period 10    PT Start Time 1518   PT Stop Time 1600   PT Time Calculation (min) 42 min   Activity Tolerance Patient tolerated treatment well;No increased pain   Behavior During Therapy [facility] for tasks assessed/performed      Past Medical History  Diagnosis Date  . Anxiety   . Diabetes mellitus without complication   . Hypertension   . CHF (congestive heart failure)   . Arthritis   . Chronic kidney disease   . Stress headaches 08/21/2014    History reviewed. No pertinent past surgical history.  There were no vitals filed for this visit.  Visit Diagnosis:  Neck pain on right side  Stiffness of cervical spine      Subjective Assessment - 10/24/14 1525    Subjective Patient states that she is feeling good upon arrival to PT. She reports that she is in no pain in her neck or shoulders. She also reports that she has been sleeping a lot over the past couple of days.    Patient is accompained by: Family member   Pertinent History No surgies, no history of cancer, self reported visit to hospital for "heart stress"    How long can you sit comfortably? able to sit for a couple hours resulting in neck pain    How long can you stand comfortably? " a good while"    How long can you walk comfortably? walk about a hour before neck becomes problem    Diagnostic tests MRI: reveals muti-level cervical  stenosis from osteophytes.    Patient Stated Goals Increase ROM in neck and decrease pain in  neck    Currently in Pain? No/denies   Pain Onset More than a month ago         Treatment: nustep: level 2, 3 minutes (unbilled)   Low row, 2x12 red tband  High row, 2x12 red tband Shoulder horizontal abduction bilaterally 2x5 bilaterally  ER red tband 2x10 bilaterally IR red tband 2x10 bilaterally  Bilateral Shoulder flexion in sitting x5  Pass 6 cones from L to R with R UE at shoulder height  Pass 6 cones from R to L with L UE at shoulder height  Large circles holding soccer ball with both UE x6  Supine scapular protraction 2x8 bilaterally  4 letters of the alphabet in supine with bilateral UE  Bilateral shoulder flexion in supine x5   PT required Moderate to Constant verbal, tactile and visual instruction for all exercises. PT provided occasion MinA with R scapular upward rotation and protraction with seated exercises, patient reports a decrease in pain, with assistance. Patient demonstrated increased R shoulder pain and decreased ROM as the treatment progressed.                         PT Education - 10/24/14 1536    Education provided Yes   Education Details shoulder strengthening, Postural exercise.  Person(s) Educated Patient   Methods Explanation;Demonstration;Tactile cues;Verbal cues   Comprehension Verbalized understanding;Returned demonstration;Verbal cues required;Tactile cues required             PT Long Term Goals - 10/08/14 1504    PT LONG TERM GOAL #1   Title Pt will increase cervical rotation bilaterally by >15 degrees to allow increased safety with ADLs 10/09/14   Baseline 36 degrees   Time 6   Period Weeks   Status Partially Met   PT LONG TERM GOAL #2   Title Pt will increase cervical extension and flexion by >15 degrees to allow increased function with bathing 10/09/14   Baseline 25 degrees    Time 6   Period Weeks   Status Partially Met   PT LONG TERM GOAL #3   Title Pt will decreased percieved impairment as noted by a >15%  reduction of disability on the NDI. 10/09/14   Baseline 40% impaired   Time 6   Period Weeks   Status Achieved   PT LONG TERM GOAL #4   Title Pt will increase bilateral functional shoulder ROM to allow reach of low back and posterior contralateral shoulder to increase ease of bathing activities. 10/09/14   Time 6   Period Weeks   Status On-going               Plan - 10/25/14 0751    Clinical Impression Statement Patient instructed in shoulder and back/postural exercises. Patient demonstrated increased ability to perform back exercises with only moderate instruction. Shoulder exercises required constant instruction including proper form, increased ROM, increased scapular activation, decreased shoulder shrug on the R. As shoulder exercises were progressed, the patient was noted to have increased pain and increased shoulder shrug on the R UE, as well as decreased ROM due to Deltoid/rotator cuff weakness. Continued skilled PT is recommended to increase shoulder strength/ROM, improve posture, and cervical ROM to allow increased function with daily life.      Pt will benefit from skilled therapeutic intervention in order to improve on the following deficits Decreased activity tolerance;Decreased endurance;Decreased mobility;Decreased range of motion;Decreased safety awareness;Decreased strength;Hypomobility;Increased fascial restricitons;Impaired flexibility;Impaired sensation;Impaired UE functional use;Improper body mechanics;Pain   Rehab Potential Good   Clinical Impairments Affecting Rehab Potential Positives: motivated to decrease pain. Negative: co-morbidities, altered mentation.    PT Frequency Biweekly   PT Duration 4 weeks   PT Treatment/Interventions ADLs/Self Care Home Management;Cryotherapy;Electrical Stimulation;Iontophoresis 31m/ml Dexamethasone;Moist Heat;Traction;Ultrasound;Therapeutic activities;Therapeutic exercise;Gait training;Stair training;Balance training;Neuromuscular  re-education;Patient/family education;Manual techniques;Passive range of motion;Dry needling   PT Next Visit Plan work on manual therapy and ROM; and posture   PT Home Exercise Plan continue as previously given.    Consulted and Agree with Plan of Care Patient        Problem List There are no active problems to display for this patient.  ABarrie FolkSPT 10/25/2014   10:58 AM  This entire session was performed under direct supervision and direction of a licensed therapist . I have personally read, edited and approve of the note as written.  Hopkins,Margaret PT, DPT 10/25/2014, 10:58 AM  [place] [facility] [address] [address]Phone: [phone]  Fax:  [phone]

## 2014-10-29 ENCOUNTER — Encounter: Payer: Self-pay | Admitting: Physical Therapy

## 2014-10-29 ENCOUNTER — Ambulatory Visit: Payer: Medicare Other | Admitting: Physical Therapy

## 2014-10-29 DIAGNOSIS — M436 Torticollis: Secondary | ICD-10-CM

## 2014-10-29 DIAGNOSIS — M542 Cervicalgia: Secondary | ICD-10-CM | POA: Diagnosis not present

## 2014-10-29 NOTE — Therapy (Signed)
[place] MAIN [facility] SERVICES [address] [place], [place], [address] Phone: [phone]   Fax:  [phone]  Physical Therapy Treatment  Patient Details  Name: Jamie Glass MRN: [id number] Date of Birth: [birth year] Referring Provider:  Denton Lank, MD  Encounter Date: 10/29/2014      PT End of Session - 10/29/14 1647    Visit Number 15   Number of Visits 18   Date for PT Re-Evaluation 11/06/14   Authorization Type Gcodes 7   Authorization Time Period 10    PT Start Time 1516   PT Stop Time 1557   PT Time Calculation (min) 41 min   Equipment Utilized During Treatment Gait belt   Activity Tolerance Patient tolerated treatment well;No increased pain   Behavior During Therapy Jamie Glass for tasks assessed/performed      Past Medical History  Diagnosis Date  . Anxiety   . Diabetes mellitus without complication   . Hypertension   . CHF (congestive heart failure)   . Arthritis   . Chronic kidney disease   . Stress headaches 08/21/2014    History reviewed. No pertinent past surgical history.  There were no vitals filed for this visit.  Visit Diagnosis:  Neck pain on right side  Stiffness of cervical spine      Subjective Assessment - 10/29/14 1524    Subjective Patient reports doing pretty good. She reports that her exercises are still hard at home. She denies any pain at start of treatment session;    Patient is accompained by: Family member   Pertinent History No surgies, no history of cancer, self reported visit to hospital for "heart stress"    How long can you sit comfortably? able to sit for a couple hours resulting in neck pain    How long can you stand comfortably? " a good while"    How long can you walk comfortably? walk about a hour before neck becomes problem    Diagnostic tests MRI: reveals muti-level cervical  stenosis from osteophytes.    Patient Stated Goals Increase ROM in neck and decrease pain in neck    Currently in Pain? No/denies   Pain Onset More than a month ago        Treatment: nustep: level 2, 4 minutes (unbilled)   Standing: BUE Low row, x10 red tband  Shoulder horizontal abduction bilaterally 2x5 bilaterally  BUE shoulder extension x10 red tband;  Seated: Red tband shoulder ER/IR x10 each UE; BUE red tband elbow flexion 2x10; Patient required moderate verbal/tactile cues for correct exercise technique including cues to increase ROM and increase scapular retraction for better postural strengthening.  Sit<>Stand with BUE overhead press with yellow weighted ball (4#) 2x5 Patient required mod Vcs to increase forward lean to improve transfer ability;  Gait in hallway with SPC, 150 feet with head turns up/down,side/side with moderate verbal cues to increase cervical ROM and to continue walking with head turns for improved gait ability;                           PT Education - 10/29/14 1640    Education provided Yes   Education Details new exercises, posture   Person(s) Educated Patient   Methods Explanation;Demonstration;Verbal cues   Comprehension Verbalized understanding;Returned demonstration;Verbal cues required             PT Long Term Goals - 10/08/14 1504    PT LONG TERM GOAL #1  Title Pt will increase cervical rotation bilaterally by >15 degrees to allow increased safety with ADLs 10/09/14   Baseline 36 degrees   Time 6   Period Weeks   Status Partially Met   PT LONG TERM GOAL #2   Title Pt will increase cervical extension and flexion by >15 degrees to allow increased function with bathing 10/09/14   Baseline 25 degrees    Time 6   Period Weeks   Status Partially Met   PT LONG TERM GOAL #3   Title Pt will decreased percieved impairment as noted by a >15% reduction of disability on the NDI. 10/09/14   Baseline 40% impaired   Time 6   Period Weeks   Status Achieved   PT LONG TERM GOAL #4   Title Pt will increase bilateral functional  shoulder ROM to allow reach of low back and posterior contralateral shoulder to increase ease of bathing activities. 10/09/14   Time 6   Period Weeks   Status On-going               Plan - 10/29/14 1654    Clinical Impression Statement Patient was instructed in BUE strengthening as well as cervical ROM during standing/gait tasks. She required increased cues for correct exercise technique and posture. Patient responded well to cues. She fatigues quickly but denies any increase in pain with advanced exercise. patient would benefit from additional skilled PT Intervention to reduce discomfort and increase cervical/shoulder ROM for increased tolerance with ADLs.   Pt will benefit from skilled therapeutic intervention in order to improve on the following deficits Decreased activity tolerance;Decreased endurance;Decreased mobility;Decreased range of motion;Decreased safety awareness;Decreased strength;Hypomobility;Increased fascial restricitons;Impaired flexibility;Impaired sensation;Impaired UE functional use;Improper body mechanics;Pain   Rehab Potential Good   Clinical Impairments Affecting Rehab Potential Positives: motivated to decrease pain. Negative: co-morbidities, altered mentation.    PT Frequency 2x / week   PT Duration 4 weeks   PT Treatment/Interventions ADLs/Self Care Home Management;Cryotherapy;Electrical Stimulation;Iontophoresis 63m/ml Dexamethasone;Moist Heat;Traction;Ultrasound;Therapeutic activities;Therapeutic exercise;Gait training;Stair training;Balance training;Neuromuscular re-education;Patient/family education;Manual techniques;Passive range of motion;Dry needling   PT Next Visit Plan work on manual therapy and ROM; and posture   PT Home Exercise Plan continue as previously given.    Consulted and Agree with Plan of Care Patient        Problem List There are no active problems to display for this patient.   Hopkins,Kiwanna Spraker, PT, DPT 10/29/2014, 5:14 PM  [place] [facility] [address] [address]Phone: [phone]  Fax:  [phone]

## 2014-10-31 ENCOUNTER — Encounter: Payer: Self-pay | Admitting: Radiology

## 2014-10-31 ENCOUNTER — Emergency Department: Payer: Medicare Other

## 2014-10-31 ENCOUNTER — Encounter: Payer: Self-pay | Admitting: Physical Therapy

## 2014-10-31 ENCOUNTER — Inpatient Hospital Stay
Admission: EM | Admit: 2014-10-31 | Discharge: 2014-11-03 | DRG: 682 | Disposition: A | Discharge status: Home Health | Payer: Medicare Other | Attending: Internal Medicine | Admitting: Internal Medicine

## 2014-10-31 ENCOUNTER — Ambulatory Visit: Payer: Medicare Other | Admitting: Physical Therapy

## 2014-10-31 VITALS — BP 90/50 | HR 60

## 2014-10-31 DIAGNOSIS — I509 Heart failure, unspecified: Secondary | ICD-10-CM | POA: Diagnosis present

## 2014-10-31 DIAGNOSIS — E1122 Type 2 diabetes mellitus with diabetic chronic kidney disease: Secondary | ICD-10-CM | POA: Diagnosis present

## 2014-10-31 DIAGNOSIS — Z7982 Long term (current) use of aspirin: Secondary | ICD-10-CM

## 2014-10-31 DIAGNOSIS — E43 Unspecified severe protein-calorie malnutrition: Secondary | ICD-10-CM | POA: Diagnosis present

## 2014-10-31 DIAGNOSIS — I129 Hypertensive chronic kidney disease with stage 1 through stage 4 chronic kidney disease, or unspecified chronic kidney disease: Secondary | ICD-10-CM | POA: Diagnosis present

## 2014-10-31 DIAGNOSIS — R4182 Altered mental status, unspecified: Secondary | ICD-10-CM

## 2014-10-31 DIAGNOSIS — E785 Hyperlipidemia, unspecified: Secondary | ICD-10-CM | POA: Diagnosis present

## 2014-10-31 DIAGNOSIS — N184 Chronic kidney disease, stage 4 (severe): Secondary | ICD-10-CM | POA: Diagnosis present

## 2014-10-31 DIAGNOSIS — I959 Hypotension, unspecified: Secondary | ICD-10-CM | POA: Diagnosis present

## 2014-10-31 DIAGNOSIS — E11649 Type 2 diabetes mellitus with hypoglycemia without coma: Secondary | ICD-10-CM | POA: Diagnosis present

## 2014-10-31 DIAGNOSIS — M436 Torticollis: Secondary | ICD-10-CM

## 2014-10-31 DIAGNOSIS — E875 Hyperkalemia: Secondary | ICD-10-CM | POA: Diagnosis not present

## 2014-10-31 DIAGNOSIS — F17210 Nicotine dependence, cigarettes, uncomplicated: Secondary | ICD-10-CM | POA: Diagnosis present

## 2014-10-31 DIAGNOSIS — N179 Acute kidney failure, unspecified: Principal | ICD-10-CM | POA: Diagnosis present

## 2014-10-31 DIAGNOSIS — R630 Anorexia: Secondary | ICD-10-CM | POA: Diagnosis present

## 2014-10-31 DIAGNOSIS — Z8249 Family history of ischemic heart disease and other diseases of the circulatory system: Secondary | ICD-10-CM | POA: Diagnosis not present

## 2014-10-31 DIAGNOSIS — F05 Delirium due to known physiological condition: Secondary | ICD-10-CM | POA: Diagnosis present

## 2014-10-31 DIAGNOSIS — E869 Volume depletion, unspecified: Secondary | ICD-10-CM | POA: Diagnosis present

## 2014-10-31 DIAGNOSIS — M1990 Unspecified osteoarthritis, unspecified site: Secondary | ICD-10-CM | POA: Diagnosis present

## 2014-10-31 DIAGNOSIS — F0390 Unspecified dementia without behavioral disturbance: Secondary | ICD-10-CM | POA: Diagnosis present

## 2014-10-31 DIAGNOSIS — L8990 Pressure ulcer of unspecified site, unspecified stage: Secondary | ICD-10-CM | POA: Diagnosis present

## 2014-10-31 DIAGNOSIS — Z8673 Personal history of transient ischemic attack (TIA), and cerebral infarction without residual deficits: Secondary | ICD-10-CM | POA: Diagnosis not present

## 2014-10-31 DIAGNOSIS — M542 Cervicalgia: Secondary | ICD-10-CM

## 2014-10-31 DIAGNOSIS — Z79899 Other long term (current) drug therapy: Secondary | ICD-10-CM

## 2014-10-31 DIAGNOSIS — E162 Hypoglycemia, unspecified: Secondary | ICD-10-CM

## 2014-10-31 DIAGNOSIS — R451 Restlessness and agitation: Secondary | ICD-10-CM | POA: Diagnosis present

## 2014-10-31 DIAGNOSIS — F419 Anxiety disorder, unspecified: Secondary | ICD-10-CM | POA: Diagnosis present

## 2014-10-31 DIAGNOSIS — R6510 Systemic inflammatory response syndrome (SIRS) of non-infectious origin without acute organ dysfunction: Secondary | ICD-10-CM

## 2014-10-31 LAB — URINALYSIS COMPLETE WITH MICROSCOPIC (ARMC ONLY)
BILIRUBIN URINE: NEGATIVE
Bacteria, UA: NONE SEEN
Glucose, UA: 150 mg/dL — AB
KETONES UR: NEGATIVE mg/dL
Leukocytes, UA: NEGATIVE
NITRITE: NEGATIVE
PROTEIN: NEGATIVE mg/dL
Specific Gravity, Urine: 1.012 (ref 1.005–1.030)
pH: 5 (ref 5.0–8.0)

## 2014-10-31 LAB — LACTIC ACID, PLASMA
LACTIC ACID, VENOUS: 3.7 mmol/L — AB (ref 0.5–2.0)
Lactic Acid, Venous: 1.9 mmol/L (ref 0.5–2.0)

## 2014-10-31 LAB — CBC WITH DIFFERENTIAL/PLATELET
Basophils Absolute: 0 10*3/uL (ref 0–0.1)
Basophils Relative: 0 %
EOS PCT: 0 %
Eosinophils Absolute: 0 10*3/uL (ref 0–0.7)
HEMATOCRIT: 36.1 % (ref 35.0–47.0)
HEMOGLOBIN: 11.4 g/dL — AB (ref 12.0–16.0)
LYMPHS ABS: 0.8 10*3/uL — AB (ref 1.0–3.6)
LYMPHS PCT: 8 %
MCH: 26.1 pg (ref 26.0–34.0)
MCHC: 31.7 g/dL — ABNORMAL LOW (ref 32.0–36.0)
MCV: 82.2 fL (ref 80.0–100.0)
Monocytes Absolute: 0.6 10*3/uL (ref 0.2–0.9)
Monocytes Relative: 6 %
NEUTROS PCT: 86 %
Neutro Abs: 7.6 10*3/uL — ABNORMAL HIGH (ref 1.4–6.5)
Platelets: 211 10*3/uL (ref 150–440)
RBC: 4.39 MIL/uL (ref 3.80–5.20)
RDW: 17.4 % — ABNORMAL HIGH (ref 11.5–14.5)
WBC: 8.9 10*3/uL (ref 3.6–11.0)

## 2014-10-31 LAB — COMPREHENSIVE METABOLIC PANEL
ALK PHOS: 56 U/L (ref 38–126)
ALK PHOS: 48 U/L (ref 38–126)
ALT: 18 U/L (ref 14–54)
ALT: 20 U/L (ref 14–54)
ANION GAP: 14 (ref 5–15)
AST: 65 U/L — ABNORMAL HIGH (ref 15–41)
AST: 90 U/L — ABNORMAL HIGH (ref 15–41)
Albumin: 4.4 g/dL (ref 3.5–5.0)
Albumin: 3.9 g/dL (ref 3.5–5.0)
Anion gap: 15 (ref 5–15)
BILIRUBIN TOTAL: 1.5 mg/dL — AB (ref 0.3–1.2)
BUN: 83 mg/dL — ABNORMAL HIGH (ref 6–20)
BUN: 79 mg/dL — ABNORMAL HIGH (ref 6–20)
CALCIUM: 10.2 mg/dL (ref 8.9–10.3)
CALCIUM: 9.6 mg/dL (ref 8.9–10.3)
CO2: 17 mmol/L — ABNORMAL LOW (ref 22–32)
CO2: 19 mmol/L — AB (ref 22–32)
CREATININE: 5.52 mg/dL — AB (ref 0.44–1.00)
CREATININE: 5.09 mg/dL — AB (ref 0.44–1.00)
Chloride: 104 mmol/L (ref 101–111)
Chloride: 105 mmol/L (ref 101–111)
GFR, EST AFRICAN AMERICAN: 7 mL/min — AB (ref >60)
GFR, EST AFRICAN AMERICAN: 8 mL/min — AB (ref >60)
GFR, EST NON AFRICAN AMERICAN: 6 mL/min — AB (ref >60)
GFR, EST NON AFRICAN AMERICAN: 7 mL/min — AB (ref >60)
Glucose, Bld: 27 mg/dL — CL (ref 65–99)
Glucose, Bld: 219 mg/dL — ABNORMAL HIGH (ref 65–99)
Potassium: 7.4 mmol/L (ref 3.5–5.1)
Potassium: 5.5 mmol/L — ABNORMAL HIGH (ref 3.5–5.1)
SODIUM: 138 mmol/L (ref 135–145)
Sodium: 136 mmol/L (ref 135–145)
TOTAL PROTEIN: 7.1 g/dL (ref 6.5–8.1)
Total Bilirubin: 1.6 mg/dL — ABNORMAL HIGH (ref 0.3–1.2)
Total Protein: 8.1 g/dL (ref 6.5–8.1)

## 2014-10-31 LAB — BLOOD GAS, ARTERIAL
ACID-BASE DEFICIT: 8.3 mmol/L — AB (ref 0.0–2.0)
BICARBONATE: 16.2 meq/L — AB (ref 21.0–28.0)
FIO2: 0.21
O2 SAT: 97.2 %
PCO2 ART: 30 mmHg — AB (ref 32.0–48.0)
Patient temperature: 37
pH, Arterial: 7.34 — ABNORMAL LOW (ref 7.350–7.450)
pO2, Arterial: 98 mmHg (ref 83.0–108.0)

## 2014-10-31 LAB — CREATININE, URINE, RANDOM: Creatinine, Urine: 101 mg/dL

## 2014-10-31 LAB — CK: Total CK: 1632 U/L — ABNORMAL HIGH (ref 38–234)

## 2014-10-31 LAB — GLUCOSE, CAPILLARY: GLUCOSE-CAPILLARY: 173 mg/dL — AB (ref 65–99)

## 2014-10-31 LAB — PROTIME-INR
INR: 1.18
PROTHROMBIN TIME: 15.2 s — AB (ref 11.4–15.0)

## 2014-10-31 LAB — SODIUM, URINE, RANDOM: Sodium, Ur: 59 mmol/L

## 2014-10-31 LAB — APTT: APTT: 38 s — AB (ref 24–36)

## 2014-10-31 LAB — TROPONIN I: Troponin I: 0.04 ng/mL — ABNORMAL HIGH (ref <0.031)

## 2014-10-31 MED ORDER — OLANZAPINE 5 MG PO TBDP
5.0000 mg | ORAL_TABLET | Freq: Every day | ORAL | Status: DC
  Administered 2014-10-31 – 2014-11-02 (×3): 5 mg via ORAL
  Filled 2014-10-31 (×4): qty 1

## 2014-10-31 MED ORDER — ONDANSETRON HCL 4 MG PO TABS
4.0000 mg | ORAL_TABLET | Freq: Four times a day (QID) | ORAL | Status: DC | PRN
Start: 2014-10-31 — End: 2014-11-03

## 2014-10-31 MED ORDER — SODIUM CHLORIDE 0.9 % IV BOLUS (SEPSIS)
1000.0000 mL | Freq: Once | INTRAVENOUS | Status: AC
  Administered 2014-10-31: 1000 mL via INTRAVENOUS

## 2014-10-31 MED ORDER — SODIUM POLYSTYRENE SULFONATE 15 GM/60ML PO SUSP
30.0000 g | Freq: Once | ORAL | Status: AC
  Administered 2014-10-31: 30 g via ORAL
  Filled 2014-10-31: qty 120

## 2014-10-31 MED ORDER — ISOSORBIDE MONONITRATE ER 30 MG PO TB24
30.0000 mg | ORAL_TABLET | Freq: Every day | ORAL | Status: DC
  Administered 2014-11-01 – 2014-11-03 (×2): 30 mg via ORAL
  Filled 2014-10-31 (×2): qty 1

## 2014-10-31 MED ORDER — SODIUM CHLORIDE 0.9 % IV SOLN
1.0000 g | Freq: Once | INTRAVENOUS | Status: AC
  Administered 2014-10-31: 1 g via INTRAVENOUS

## 2014-10-31 MED ORDER — PIPERACILLIN-TAZOBACTAM 3.375 G IVPB
3.3750 g | Freq: Once | INTRAVENOUS | Status: AC
  Administered 2014-10-31: 3.375 g via INTRAVENOUS
  Filled 2014-10-31: qty 50

## 2014-10-31 MED ORDER — ACETAMINOPHEN 325 MG PO TABS: 650.0000 mg | ORAL_TABLET | Freq: Four times a day (QID) | ORAL | Status: DC | PRN

## 2014-10-31 MED ORDER — GABAPENTIN 100 MG PO CAPS
100.0000 mg | ORAL_CAPSULE | Freq: Two times a day (BID) | ORAL | Status: DC
  Administered 2014-10-31 – 2014-11-03 (×5): 100 mg via ORAL
  Filled 2014-10-31 (×5): qty 1

## 2014-10-31 MED ORDER — HEPARIN SODIUM (PORCINE) 5000 UNIT/ML IJ SOLN
5000.0000 [IU] | Freq: Three times a day (TID) | INTRAMUSCULAR | Status: DC
  Administered 2014-10-31 – 2014-11-03 (×7): 5000 [IU] via SUBCUTANEOUS
  Filled 2014-10-31 (×7): qty 1

## 2014-10-31 MED ORDER — DEXTROSE 50 % IV SOLN
INTRAVENOUS | Status: AC
  Administered 2014-10-31: 25 g via INTRAVENOUS
  Filled 2014-10-31: qty 50

## 2014-10-31 MED ORDER — PRAVASTATIN SODIUM 20 MG PO TABS
20.0000 mg | ORAL_TABLET | Freq: Every day | ORAL | Status: DC
  Administered 2014-11-01 – 2014-11-03 (×3): 20 mg via ORAL
  Filled 2014-10-31 (×3): qty 1

## 2014-10-31 MED ORDER — ACETAMINOPHEN 650 MG RE SUPP: 650.0000 mg | Freq: Four times a day (QID) | RECTAL | Status: DC | PRN

## 2014-10-31 MED ORDER — OXYCODONE HCL 5 MG PO TABS: 5.0000 mg | ORAL_TABLET | ORAL | Status: DC | PRN

## 2014-10-31 MED ORDER — CALCIUM GLUCONATE 10 % IV SOLN
INTRAVENOUS | Status: AC
  Filled 2014-10-31: qty 10

## 2014-10-31 MED ORDER — SODIUM CHLORIDE 0.9 % IJ SOLN
3.0000 mL | Freq: Two times a day (BID) | INTRAMUSCULAR | Status: DC
  Administered 2014-11-01: 3 mL via INTRAVENOUS

## 2014-10-31 MED ORDER — DEXTROSE 50 % IV SOLN
25.0000 g | Freq: Once | INTRAVENOUS | Status: AC
  Administered 2014-10-31: 25 g via INTRAVENOUS

## 2014-10-31 MED ORDER — ONDANSETRON HCL 4 MG/2ML IJ SOLN: 4.0000 mg | Freq: Four times a day (QID) | INTRAMUSCULAR | Status: DC | PRN

## 2014-10-31 MED ORDER — SODIUM CHLORIDE 0.9 % IV BOLUS (SEPSIS)
686.0000 mL | Freq: Once | INTRAVENOUS | Status: AC
Start: 2014-10-31 — End: 2014-10-31
  Administered 2014-10-31: 686 mL via INTRAVENOUS

## 2014-10-31 MED ORDER — ALBUTEROL SULFATE (2.5 MG/3ML) 0.083% IN NEBU
2.5000 mg | INHALATION_SOLUTION | Freq: Once | RESPIRATORY_TRACT | Status: AC
  Administered 2014-10-31: 2.5 mg via RESPIRATORY_TRACT
  Filled 2014-10-31: qty 3

## 2014-10-31 MED ORDER — ASPIRIN EC 81 MG PO TBEC
81.0000 mg | DELAYED_RELEASE_TABLET | Freq: Every day | ORAL | Status: DC
  Administered 2014-11-01 – 2014-11-03 (×2): 81 mg via ORAL
  Filled 2014-10-31 (×2): qty 1

## 2014-10-31 MED ORDER — SODIUM BICARBONATE 8.4 % IV SOLN
50.0000 meq | Freq: Once | INTRAVENOUS | Status: AC
  Administered 2014-10-31: 50 meq via INTRAVENOUS
  Filled 2014-10-31: qty 50

## 2014-10-31 MED ORDER — MIRTAZAPINE 15 MG PO TABS
7.5000 mg | ORAL_TABLET | Freq: Every day | ORAL | Status: DC
  Administered 2014-10-31 – 2014-11-02 (×3): 7.5 mg via ORAL
  Filled 2014-10-31 (×3): qty 1

## 2014-10-31 MED ORDER — VANCOMYCIN HCL IN DEXTROSE 1-5 GM/200ML-% IV SOLN
1000.0000 mg | Freq: Once | INTRAVENOUS | Status: AC
  Administered 2014-10-31: 1000 mg via INTRAVENOUS
  Filled 2014-10-31: qty 200

## 2014-10-31 MED ORDER — INSULIN ASPART 100 UNIT/ML ~~LOC~~ SOLN: 0.0000 [IU] | Freq: Every day | SUBCUTANEOUS | Status: DC

## 2014-10-31 MED ORDER — DEXTROSE-NACL 5-0.9 % IV SOLN
INTRAVENOUS | Status: DC
  Administered 2014-10-31 – 2014-11-01 (×2): via INTRAVENOUS

## 2014-10-31 MED ORDER — INSULIN ASPART 100 UNIT/ML ~~LOC~~ SOLN
0.0000 [IU] | Freq: Three times a day (TID) | SUBCUTANEOUS | Status: DC
  Administered 2014-11-03: 2 [IU] via SUBCUTANEOUS
  Filled 2014-10-31: qty 2

## 2014-10-31 NOTE — ED Notes (Addendum)
Patient presents to the ED with confusion that started at 6am per patient's daughter.  In triage patient has difficulty answering questions and is picking at things that aren't there, at the floor, etc.  Patient's daughter states behavior is abnormal and normally patient is "chatty". Patient has her head slumped over in triage.  Patient's diastolic bp was in the 60s today per daughter when patient was at physical therapy.

## 2014-10-31 NOTE — Therapy (Signed)
[place] MAIN [facility] SERVICES [address] [place], [place], [address] Phone: [phone]   Fax:  [phone]  Physical Therapy Treatment  Patient Details  Name: Jamie Glass MRN: [id number] Date of Birth: [birth year] Referring Provider:  Denton Lank, MD  Encounter Date: 10/31/2014    Past Medical History  Diagnosis Date  . Anxiety   . Diabetes mellitus without complication   . Hypertension   . CHF (congestive heart failure)   . Arthritis   . Chronic kidney disease   . Stress headaches 08/21/2014    History reviewed. No pertinent past surgical history.  Filed Vitals:   10/31/14 1722 10/31/14 1724  BP: 85/60 90/50  Pulse: 60     Visit Diagnosis:  Neck pain on right side  Stiffness of cervical spine      Subjective Assessment - 10/31/14 1724    Subjective Patient arrives to PT late and apprears very confused. Patient's daughter states that she fell multiple times today. Patient's daughter also stated that she did not drink any water today or consume any food prior to PT.    Patient is accompained by: Family member   Pertinent History No surgies, no history of cancer, self reported visit to hospital for "heart stress"    How long can you sit comfortably? able to sit for a couple hours resulting in neck pain    How long can you stand comfortably? " a good while"    How long can you walk comfortably? ati   Diagnostic tests MRI: reveals muti-level cervical  stenosis from osteophytes.    Patient Stated Goals Increase ROM in neck and decrease pain in neck    Currently in Pain? No/denies   Pain Onset More than a month ago         Patient appears confused upon arrival to PT this day with increased lethargy.  Patient oriented to person, but not oriented place, time, or situation.   BP taken: 85/16mHg BP retaken after 3 minutes: 90/50 mmHg HR: 60 bpm.   Based on history, altered mentation, and decreased BP, PT recommends  that the patient be assessed in the Emergency department. Patient's daughter was agreeable, and PT escorted the patient to the ED.   [facility]                               PT Long Term Goals - 10/08/14 1504    PT LONG TERM GOAL #1   Title Pt will increase cervical rotation bilaterally by >15 degrees to allow increased safety with ADLs 10/09/14   Baseline 36 degrees   Time 6   Period Weeks   Status Partially Met   PT LONG TERM GOAL #2   Title Pt will increase cervical extension and flexion by >15 degrees to allow increased function with bathing 10/09/14   Baseline 25 degrees    Time 6   Period Weeks   Status Partially Met   PT LONG TERM GOAL #3   Title Pt will decreased percieved impairment as noted by a >15% reduction of disability on the NDI. 10/09/14   Baseline 40% impaired   Time 6   Period Weeks   Status Achieved   PT LONG TERM GOAL #4   Title Pt will increase bilateral functional shoulder ROM to allow reach of low back and posterior contralateral shoulder to increase ease of bathing  activities. 10/09/14   Time 6   Period Weeks   Status On-going               Problem List Patient Active Problem List   Diagnosis Date Noted  . Acute renal failure 10/31/2014  . Hyperkalemia 10/31/2014   Barrie Folk SPT 11/01/2014   9:18 AM  This entire session was performed under direct supervision and direction of a licensed therapist . I have personally read, edited and approve of the note as written.  Hopkins,Margaret, PT, DPT 11/01/2014, 9:18 AM  [place] MAIN [facility] SERVICES [address] [place], [place], [address] Phone: [phone]   Fax:  [phone]

## 2014-10-31 NOTE — ED Notes (Signed)
Attempted to call report, informed receiving nurse occupied in room and all other nurses unavailable at this time to take report.  This nurse asked to call back in 10 min.

## 2014-10-31 NOTE — ED Notes (Signed)
Zosyn infusing over 30 minutes per Dr. Inocencio Homes.

## 2014-10-31 NOTE — ED Provider Notes (Signed)
[facility] Emergency Department Provider Note  ____________________________________________  Time seen: Approximately 5:24 PM  I have reviewed the triage vital signs and the nursing notes.   HISTORY  Chief Complaint Altered Mental Status  Caveat-History of present illness review of systems limited secondary to the patient's altered mental status. All history is obtained from the patient's daughter who is at bedside.  HPI Jamie Glass is a 81 y.o. female with history of CVA, hypertension, diabetes, chronic kidney disease, CHF who presents for evaluation of altered mental status today, gradual onset, constant. Daughter reports that she has been more confused today since approximately 6 AM. She does not know what date it is, thinks it is her birthday. She has been "picking at things". She is not talking as much as she usually does. She went to physical therapy today when she was noted to be hypotensive and was sent to the emergency department for further evaluation. She has had no recent illness including no cough, vomiting, diarrhea, fevers or chills. No recent fall though she is chronically unsteady on her feet and ambulates with a cane. She has had poor appetite over the past week.   Past Medical History  Diagnosis Date  . Anxiety   . Diabetes mellitus without complication   . Hypertension   . CHF (congestive heart failure)   . Arthritis   . Chronic kidney disease   . Stress headaches 08/21/2014    There are no active problems to display for this patient.   No past surgical history on file.  Current Outpatient Rx  Name  Route  Sig  Dispense  Refill  . amLODipine (NORVASC) 5 MG tablet   Oral   Take 5 mg by mouth daily.         Marland Kitchen aspirin 81 MG tablet   Oral   Take 81 mg by mouth daily.         . furosemide (LASIX) 40 MG tablet   Oral   Take 40 mg by mouth.         . gabapentin (NEURONTIN) 100 MG capsule   Oral   Take 100 mg by mouth 3  (three) times daily.         Marland Kitchen glipiZIDE (GLUCOTROL XL) 2.5 MG 24 hr tablet   Oral   Take 2.5 mg by mouth daily with breakfast.         . isosorbide mononitrate (IMDUR) 30 MG 24 hr tablet   Oral   Take 30 mg by mouth daily.         Marland Kitchen lisinopril (PRINIVIL,ZESTRIL) 20 MG tablet   Oral   Take 20 mg by mouth daily.         . metFORMIN (GLUCOPHAGE) 500 MG tablet   Oral   Take by mouth 2 (two) times daily with a meal.         . metoprolol tartrate (LOPRESSOR) 25 MG tablet   Oral   Take 25 mg by mouth 2 (two) times daily.         . mirtazapine (REMERON) 7.5 MG tablet   Oral   Take 7.5 mg by mouth at bedtime.         Marland Kitchen OLANZapine (ZYPREXA) 5 MG tablet   Oral   Take 5 mg by mouth at bedtime.         . potassium chloride (K-DUR) 10 MEQ tablet   Oral   Take 10 mEq by mouth daily.         Marland Kitchen  pravastatin (PRAVACHOL) 20 MG tablet   Oral   Take 20 mg by mouth daily.           Allergies Review of patient's allergies indicates no known allergies.  No family history on file.  Social History Social History  Substance Use Topics  . Smoking status: Current Every Day Smoker -- 0.50 packs/day for 84 years    Types: Cigarettes  . Smokeless tobacco: None  . Alcohol Use: No    Review of Systems Constitutional: No fever/chills Gastrointestinal: No  vomiting.  No diarrhea.     Caveat-History of present illness review of systems limited secondary to the patient's altered mental status. All history is obtained from the patient's daughter who is at bedside. ____________________________________________   PHYSICAL EXAM:  VITAL SIGNS: ED Triage Vitals  Enc Vitals Group     BP 10/31/14 1711 130/70 mmHg     Pulse Rate 10/31/14 1711 102     Resp 10/31/14 1711 24     Temp 10/31/14 1711 97.5 F (36.4 C)     Temp Source 10/31/14 1711 Oral     SpO2 10/31/14 1713 90 %     Weight 10/31/14 1714 124 lb (56.246 kg)     Height 10/31/14 1714 5\' 3"  (1.6 m)     Head Cir  --      Peak Flow --      Pain Score --      Pain Loc --      Pain Edu? --      Excl. in GC? --     Constitutional: Alert and oriented to self only. Intermittently follows commands. Babbles incoherently at times. Eyes: Conjunctivae are normal. PERRL. EOMI. Head: Atraumatic. Nose: No congestion/rhinnorhea. Mouth/Throat: Mucous membranes are moist.  Oropharynx non-erythematous. Neck: No stridor.  Cardiovascular: mildly tachycardic rate, regular rhythm. Grossly normal heart sounds.  Good peripheral circulation. Respiratory: + mild tachypnea, Normal respiratory effort.  No retractions. Lungs CTAB. Gastrointestinal: Soft and nontender. No distention. No abdominal bruits. No CVA tenderness. Genitourinary: deferred Musculoskeletal: No lower extremity tenderness nor edema.  No joint effusions. Neurologic:  Appears to move all extremity is equally but does not follow commands/cooperate with formal neurological testing. Skin:  Skin is warm, dry and intact. No rash noted. Psychiatric: Unable to assess secondary to altered mental status  ____________________________________________   LABS (all labs ordered are listed, but only abnormal results are displayed)  Labs Reviewed  CBC WITH DIFFERENTIAL/PLATELET - Abnormal; Notable for the following:    Hemoglobin 11.4 (*)    MCHC 31.7 (*)    RDW 17.4 (*)    Neutro Abs 7.6 (*)    Lymphs Abs 0.8 (*)    All other components within normal limits  COMPREHENSIVE METABOLIC PANEL - Abnormal; Notable for the following:    Potassium 7.4 (*)    CO2 17 (*)    Glucose, Bld 27 (*)    BUN 83 (*)    Creatinine, Ser 5.52 (*)    AST 65 (*)    Total Bilirubin 1.5 (*)    GFR calc non Af Amer 6 (*)    GFR calc Af Amer 7 (*)    All other components within normal limits  TROPONIN I - Abnormal; Notable for the following:    Troponin I 0.04 (*)    All other components within normal limits  APTT - Abnormal; Notable for the following:    aPTT 38 (*)    All  other components within normal limits  PROTIME-INR - Abnormal; Notable for the following:    Prothrombin Time 15.2 (*)    All other components within normal limits  CULTURE, BLOOD (ROUTINE X 2)  CULTURE, BLOOD (ROUTINE X 2)  LACTIC ACID, PLASMA  URINALYSIS COMPLETEWITH MICROSCOPIC ([facility] ONLY)  LACTIC ACID, PLASMA   ____________________________________________  EKG  ED ECG REPORT I, Gayla Doss, the attending physician, personally viewed and interpreted this ECG.   Date: 10/31/2014  EKG Time: 17:12  Rate: 100  Rhythm: normal sinus rhythm  Axis: normal  Intervals:none  ST&T Change: Q waves in leads 3, aVF, V3, V4  ____________________________________________  RADIOLOGY  CXR FINDINGS: The heart size and mediastinal contours are within normal limits. Aortic atherosclerosis identified. Both lungs are clear. The visualized skeletal structures are unremarkable.  IMPRESSION: No evidence for pneumonia.   CT head IMPRESSION: Small vessel chronic ischemic changes of deep cerebral white matter.  Multiple small old infarcts as above.  No acute intracranial abnormalities.  ____________________________________________   PROCEDURES  Procedure(s) performed: None  Critical Care performed: Yes, see critical care note(s). Total critical care time spent 45 minutes.  ____________________________________________   INITIAL IMPRESSION / ASSESSMENT AND PLAN / ED COURSE  Pertinent labs & imaging results that were available during my care of the patient were reviewed by me and considered in my medical decision making (see chart for details).  Jamie Glass is a 81 y.o. female with history of CVA, hypertension, diabetes, chronic kidney disease, CHF who presents for evaluation of altered mental status today, gradual onset, constant. On exam, she appears confused but she is alert, intermittently following commands, speaking incoherently at times. She is tachycardic, tachypnea,  intermittently hypotensive though this is responding to IV fluids. She appears to move all her extremities equally but she does not cooperate with formal neurological testing. Currently meets 2 out of 4 Sirs criteria. Plan for liberal IV fluids, vancomycin, Zosyn, chest x-ray, CT head, screening labs, blood cultures and urinalysis. Anticipate admission.  ----------------------------------------- 7:17 PM on 10/31/2014 -----------------------------------------  Labs notable for hypoglycemia, we'll give 2 amps of D50. Hyperkalemia in the setting of acute renal failure with creatinine 5.5. EKG with peaked T waves in V3, V4, we'll begin treatment for hyperkalemia with IV calcium gluconate, albuterol, Kayexalate. We'll not give insulin until her blood sugar stabilizes. I discussed the case with Dr. Thedore Mins of nephrology who agrees with the above management as well as recommends Foley placement, amp of bicarb and recheck potassium in one hour to assess for need for emergent dialysis. I discussed the case with Dr. Gabriel Carina, hospitalist, for admission. ____________________________________________   FINAL CLINICAL IMPRESSION(S) / ED DIAGNOSES  Final diagnoses:  Altered mental status, unspecified altered mental status type  SIRS (systemic inflammatory response syndrome)  Acute hyperkalemia  Acute renal failure, unspecified acute renal failure type  Hypoglycemia      Gayla Doss, MD 11/01/14 [phone]

## 2014-10-31 NOTE — H&P (Addendum)
[facility] - [place] [facility]   PATIENT NAME: Jamie Glass    MR#:  [id number]  DATE OF BIRTH:  [birth year]   DATE OF ADMISSION:  10/31/2014  PRIMARY CARE PHYSICIAN: Rafael Bihari, MD   REQUESTING/REFERRING PHYSICIAN: Inocencio Homes  CHIEF COMPLAINT:   Chief Complaint  Patient presents with  . Altered Mental Status    HISTORY OF PRESENT ILLNESS:  Jamie Glass  is a 81 y.o. female with a known history of dementia, essential hypertension, type 2 diabetes uncomplicated, hyperlipidemia unspecified presenting with altered mental status. The patient is unable to provide any meaningful information given mental status/medical condition. History obtained from family member present at bedside. She states that her mother has been increasingly confused more so than baseline as well as drowsy the last 1-2 day duration. She took the patient to physical therapy today where they noted her blood pressure below and prompted them to present to the hospital. Given the above symptoms at present to Hospital further workup and evaluation. Of note she does describe poor by mouth intake for at least one week total duration. On EMS arrival she is noted to be hypoglycemic with glucose of 27. Despite an adequate oral intake she has been continuing to take her medications.  PAST MEDICAL HISTORY:   Past Medical History  Diagnosis Date  . Anxiety   . Diabetes mellitus without complication   . Hypertension   . CHF (congestive heart failure)   . Arthritis   . Chronic kidney disease   . Stress headaches 08/21/2014    PAST SURGICAL HISTORY:  History reviewed. No pertinent past surgical history.  SOCIAL HISTORY:   Social History  Substance Use Topics  . Smoking status: Current Every Day Smoker -- 0.50 packs/day for 84 years    Types: Cigarettes  . Smokeless tobacco: Not on file  . Alcohol Use: No    FAMILY HISTORY:   Family History  Problem Relation Age of Onset  .  Hypertension Other     DRUG ALLERGIES:  No Known Allergies  REVIEW OF SYSTEMS:  Unable to obtain given patient's mental status/medical condition  MEDICATIONS AT HOME:   Prior to Admission medications   Medication Sig Start Date End Date Taking? Authorizing Provider  amLODipine (NORVASC) 5 MG tablet Take 5 mg by mouth daily.   Yes Historical Provider, MD  aspirin EC 81 MG tablet Take 81 mg by mouth daily.   Yes Historical Provider, MD  furosemide (LASIX) 40 MG tablet Take 40 mg by mouth 2 (two) times daily.    Yes Historical Provider, MD  gabapentin (NEURONTIN) 100 MG capsule Take 100 mg by mouth 2 (two) times daily.    Yes Historical Provider, MD  glipiZIDE (GLUCOTROL XL) 2.5 MG 24 hr tablet Take 2.5 mg by mouth daily.    Yes Historical Provider, MD  isosorbide mononitrate (IMDUR) 30 MG 24 hr tablet Take 30 mg by mouth daily.   Yes Historical Provider, MD  lisinopril (PRINIVIL,ZESTRIL) 20 MG tablet Take 20 mg by mouth 2 (two) times daily.    Yes Historical Provider, MD  metFORMIN (GLUCOPHAGE) 500 MG tablet Take by mouth 2 (two) times daily with a meal.   Yes Historical Provider, MD  metoprolol tartrate (LOPRESSOR) 25 MG tablet Take 25 mg by mouth 2 (two) times daily.   Yes Historical Provider, MD  mirtazapine (REMERON) 7.5 MG tablet Take 7.5 mg by mouth at bedtime.   Yes Historical Provider, MD  OLANZapine zydis (  ZYPREXA) 5 MG disintegrating tablet Take 5 mg by mouth at bedtime.   Yes Historical Provider, MD  potassium chloride (K-DUR) 10 MEQ tablet Take 10 mEq by mouth 2 (two) times daily.    Yes Historical Provider, MD  pravastatin (PRAVACHOL) 20 MG tablet Take 20 mg by mouth daily.   Yes Historical Provider, MD      VITAL SIGNS:  Blood pressure 152/76, pulse 101, temperature 97.5 F (36.4 C), temperature source Oral, resp. rate 20, height 5\' 3"  (1.6 m), weight 124 lb (56.246 kg), SpO2 95 %.  PHYSICAL EXAMINATION:   VITAL SIGNS: Filed Vitals:   10/31/14 2043  BP: 152/76   Pulse: 101  Temp:   Resp: 20   GENERAL:81 y.o.female moderate distress given mental status.  HEAD: Normocephalic, atraumatic.  EYES: Pupils equal, round, reactive to light. Unable to assess extraocular muscles given mental status/medical condition. No scleral icterus.  MOUTH: Markedly dry mucosal membrane. Dentition poor. No abscess noted.  EAR, NOSE, THROAT: Clear without exudates. No external lesions.  NECK: Supple. No thyromegaly. No nodules. No JVD.  PULMONARY: Clear to ascultation, without wheeze rails or rhonci. No use of accessory muscles, Good respiratory effort. good air entry bilaterally CHEST: Nontender to palpation.  CARDIOVASCULAR: S1 and S2. Regular rate and rhythm. No murmurs, rubs, or gallops. No edema. Pedal pulses 2+ bilaterally.  GASTROINTESTINAL: Soft, nontender, nondistended. No masses. Positive bowel sounds. No hepatosplenomegaly.  MUSCULOSKELETAL: No swelling, clubbing, or edema. Passive Range of motion full in all extremities.  NEUROLOGIC: Unable to assess given mental status/medical condition SKIN: No ulceration, lesions, rashes, or cyanosis. Skin warm and dry. Turgor intact.  PSYCHIATRIC: Unable to assess given mental status/medical condition she is able to mumble incoherently answers simple yes no questions but provide no accurate information.     LABORATORY PANEL:   CBC  Recent Labs Lab 10/31/14 1744  WBC 8.9  HGB 11.4*  HCT 36.1  PLT 211   ------------------------------------------------------------------------------------------------------------------  Chemistries   Recent Labs Lab 10/31/14 1744  NA 136  K 7.4*  CL 104  CO2 17*  GLUCOSE 27*  BUN 83*  CREATININE 5.52*  CALCIUM 10.2  AST 65*  ALT 18  ALKPHOS 56  BILITOT 1.5*   ------------------------------------------------------------------------------------------------------------------  Cardiac Enzymes  Recent Labs Lab 10/31/14 1744  TROPONINI 0.04*    ------------------------------------------------------------------------------------------------------------------  RADIOLOGY:  Dg Chest 2 View  10/31/2014   CLINICAL DATA:  Evaluate for pneumonia.  Smoker.  EXAM: CHEST  2 VIEW  COMPARISON:  None.  FINDINGS: The heart size and mediastinal contours are within normal limits. Aortic atherosclerosis identified. Both lungs are clear. The visualized skeletal structures are unremarkable.  IMPRESSION: No evidence for pneumonia.   Electronically Signed   By: Signa Kell M.D.   On: 10/31/2014 18:19   Ct Head Wo Contrast  10/31/2014   CLINICAL DATA:  Confusion beginning at 0600 hours, difficulty answering questions, altered mental status, diabetes mellitus, hypertension, CHF, smoker  EXAM: CT HEAD WITHOUT CONTRAST  TECHNIQUE: Contiguous axial images were obtained from the base of the skull through the vertex without intravenous contrast.  COMPARISON:  11/26/2013  FINDINGS: Generalized atrophy.  Normal ventricular morphology.  No midline shift or mass effect.  Small vessel chronic ischemic changes of deep cerebral white matter.  Small old high RIGHT parietal infarcts.  Old lacunar infarcts at caudate heads bilaterally and at RIGHT external capsule.  No intracranial hemorrhage, mass lesion, or acute infarction.  Visualized paranasal sinuses and mastoid air cells clear.  Bones unremarkable.  Atherosclerotic calcifications at skullbase.  IMPRESSION: Small vessel chronic ischemic changes of deep cerebral white matter.  Multiple small old infarcts as above.  No acute intracranial abnormalities.   Electronically Signed   By: Ulyses Southward M.D.   On: 10/31/2014 17:56    EKG:   Orders placed or performed during the hospital encounter of 10/31/14  . ED EKG  . ED EKG    IMPRESSION AND PLAN:   81 year old African-American female history of type 2 diabetes non-insulin-requiring, essential hypertension, dementia presenting with worsening mental status. Emergency  department noted to have markedly elevated potassium as well as creatinine.  1. Acute renal failure: Foley catheter inserted, follow ins and outs, IV fluid challenge, follow renal function, avoid nephrotoxic agents including further Lasix, Ace inhibitors, NSAIDs. Check CK level given elevated hemoglobin but not RBCs on urinalysis, check urine creatinine, urea to calculate fractional excretion of urea-unable to use sodium given diuretic usage. Check renal ultrasound for further etiologies 2. Hyperkalemia: Hold potassium replacement that she was continuously getting, IV fluid hydration, artery treated appropriately by emergency department staff, follow BMP every 6 hours to ensure trending down of potassium if it is not appropriately moving will need to re-dose medications if proves refractory may require dialysis in the future 3. Essential hypertension: Was relatively hypotensive upon arrival, will hold her antihypertensives medications and restart them cautiously, once again avoiding the ACE inhibitor and diuretic for now 4. Hyperlipidemia unspecified: Pravastatin 5. Venous thromboembolism prophylactic: Heparin subcutaneous 6. Hypoglycemia associated type 2 diabetes: D5 infusion, Accu-Cheks every 2 hours until stabilized, hold oral agents    All the records are reviewed and case discussed with ED provider. Management plans discussed with the patient, family and they are in agreement.  CODE STATUS: Full  TOTAL TIME TAKING CARE OF THIS PATIENT: 45 minutes.    Hower,  Mardi Mainland.D on 10/31/2014 at 8:53 PM  Between 7am to 6pm - Pager - [phone]  After 6pm: House Pager: - [phone]  [facility]  Office  [phone]  CC: Primary care physician; Rafael Bihari, MD

## 2014-10-31 NOTE — ED Notes (Signed)
Pt transported to CT and X-Ray

## 2014-11-01 LAB — BASIC METABOLIC PANEL
Anion gap: 9 (ref 5–15)
Anion gap: 9 (ref 5–15)
Anion gap: 10 (ref 5–15)
BUN: 72 mg/dL — AB (ref 6–20)
BUN: 67 mg/dL — ABNORMAL HIGH (ref 6–20)
BUN: 65 mg/dL — AB (ref 6–20)
CALCIUM: 8.8 mg/dL — AB (ref 8.9–10.3)
CHLORIDE: 114 mmol/L — AB (ref 101–111)
CO2: 17 mmol/L — AB (ref 22–32)
CO2: 21 mmol/L — ABNORMAL LOW (ref 22–32)
CO2: 20 mmol/L — ABNORMAL LOW (ref 22–32)
CREATININE: 4.52 mg/dL — AB (ref 0.44–1.00)
CREATININE: 3.64 mg/dL — AB (ref 0.44–1.00)
Calcium: 9 mg/dL (ref 8.9–10.3)
Calcium: 9 mg/dL (ref 8.9–10.3)
Chloride: 112 mmol/L — ABNORMAL HIGH (ref 101–111)
Chloride: 113 mmol/L — ABNORMAL HIGH (ref 101–111)
Creatinine, Ser: 4.31 mg/dL — ABNORMAL HIGH (ref 0.44–1.00)
GFR calc Af Amer: 9 mL/min — ABNORMAL LOW (ref >60)
GFR calc Af Amer: 10 mL/min — ABNORMAL LOW (ref >60)
GFR calc Af Amer: 12 mL/min — ABNORMAL LOW (ref >60)
GFR calc non Af Amer: 8 mL/min — ABNORMAL LOW (ref >60)
GFR calc non Af Amer: 9 mL/min — ABNORMAL LOW (ref >60)
GFR, EST NON AFRICAN AMERICAN: 10 mL/min — AB (ref >60)
GLUCOSE: 117 mg/dL — AB (ref 65–99)
Glucose, Bld: 143 mg/dL — ABNORMAL HIGH (ref 65–99)
Glucose, Bld: 148 mg/dL — ABNORMAL HIGH (ref 65–99)
Potassium: 5.1 mmol/L (ref 3.5–5.1)
Potassium: 5.1 mmol/L (ref 3.5–5.1)
Potassium: 4.9 mmol/L (ref 3.5–5.1)
SODIUM: 143 mmol/L (ref 135–145)
Sodium: 140 mmol/L (ref 135–145)
Sodium: 142 mmol/L (ref 135–145)

## 2014-11-01 LAB — GLUCOSE, CAPILLARY
GLUCOSE-CAPILLARY: 123 mg/dL — AB (ref 65–99)
GLUCOSE-CAPILLARY: 145 mg/dL — AB (ref 65–99)
GLUCOSE-CAPILLARY: 159 mg/dL — AB (ref 65–99)
GLUCOSE-CAPILLARY: 166 mg/dL — AB (ref 65–99)
GLUCOSE-CAPILLARY: 173 mg/dL — AB (ref 65–99)
GLUCOSE-CAPILLARY: 211 mg/dL — AB (ref 65–99)
Glucose-Capillary: 150 mg/dL — ABNORMAL HIGH (ref 65–99)
Glucose-Capillary: 148 mg/dL — ABNORMAL HIGH (ref 65–99)
Glucose-Capillary: 120 mg/dL — ABNORMAL HIGH (ref 65–99)

## 2014-11-01 LAB — C DIFFICILE QUICK SCREEN W PCR REFLEX
C Diff antigen: NEGATIVE
C Diff interpretation: NEGATIVE
C Diff toxin: NEGATIVE

## 2014-11-01 MED ORDER — GLUCERNA SHAKE PO LIQD
237.0000 mL | Freq: Three times a day (TID) | ORAL | Status: DC
  Administered 2014-11-01 – 2014-11-03 (×3): 237 mL via ORAL

## 2014-11-01 MED ORDER — SODIUM CHLORIDE 0.9 % IV SOLN
INTRAVENOUS | Status: AC
  Administered 2014-11-01 (×2): via INTRAVENOUS

## 2014-11-01 NOTE — Care Management (Signed)
Spoke with patient's daughter Aram Beecham.  She confirms that patient has round the clock supervision and even though she has dementia, at baseline, she is not confused.  Yesterday while on her way to Rehab dept for bi weekly outpatient physical therapy- while walking through the medical mall at [facility], she became very confused, weak  and almost fell.  She was hallucinating.  Patient does have a cane and front wheeled rolling walker at home.  She does not like to use them.  Aram Beecham is agreeable to home health nursing .  Agency preference is [facility].  Gave "heads up" referral to [facility] for SN and PT.  Message for attending to order home health SN and PT at discharge.  Report for weekend CM to follow for discharge.

## 2014-11-01 NOTE — Progress Notes (Signed)
Initial Nutrition Assessment  DOCUMENTATION CODES:   Severe malnutrition in context of chronic illness  INTERVENTION:   Meals and Snacks: Cater to patient preferences; pt wears dentures but does not have here in hospital, family reports pt needs soft foods. Recommend downgrading diet to Dysphagia III.  Medical Food Supplement Therapy: pt likes Butter Pecan flavor supplements; recommend sending Glucerna TID, likes ice cream also, will send magic cup BID   NUTRITION DIAGNOSIS:   Inadequate oral intake related to poor appetite as evidenced by per patient/family report, percent weight loss.  GOAL:   Patient will meet greater than or equal to 90% of their needs  MONITOR:    (Energy Intake, Antrhopometrics, Digestive System, Glucose Profile, Electrolyte/Renal Profile)  REASON FOR ASSESSMENT:   Diagnosis    ASSESSMENT:    Pt admitted with weakness, confusion, dizziness with poor po intake, ARF with hyperkalemia on potassium supplements as outpatient. Pt is alert but pleasantly confused, family at bedside.   Past Medical History  Diagnosis Date  . Anxiety   . Diabetes mellitus without complication   . Hypertension   . CHF (congestive heart failure)   . Arthritis   . Chronic kidney disease   . Stress headaches 08/21/2014   History reviewed. No pertinent past surgical history.  Diet Order:  DIET DYS 3 Room service appropriate?: Yes; Fluid consistency:: Thin   Energy Intake: per family, pt ate chicken salad sandwich and fruit at lunch today, did not eat breakfast.   Food and nutrition related history: pt with poor intake  Barely eating anything for past week; daughter reports appetite at home has been poor as well  Digestive System: pt is edentulous, pt has dentures but not wearing  Electrolyte/Renal Profile  Recent Labs Lab 10/31/14 2031 11/01/14 0249 11/01/14 0836  NA 138 140 142  K 5.5* 5.1 5.1  CL 105 114* 112*  CO2 19* 17* 21*  BUN 79* 72* 67*  CREATININE  5.09* 4.52* 4.31*  CALCIUM 9.6 9.0 9.0  GLUCOSE 219* 143* 148*   Glucose Profile:   Recent Labs  11/01/14 0638 11/01/14 0743 11/01/14 1114  GLUCAP 159* 145* 123*   Protein Profile:   Recent Labs Lab 10/31/14 1744 10/31/14 2031  ALBUMIN 4.4 3.9   Nutritional Anemia Profile:  CBC Latest Ref Rng 10/31/2014 11/26/2013 05/03/2013  WBC 3.6 - 11.0 K/uL 8.9 4.4 -  Hemoglobin 12.0 - 16.0 g/dL 11.4(L) 11.9(L) -  Hematocrit 35.0 - 47.0 % 36.1 38.0 -  Platelets 150 - 440 K/uL 211 204 257    Meds: NS at 125 ml/hr, ss novolog, remeron  Skin:  Reviewed, no issues  Last BM:  8/26 Nutrition Focused Physical Exam: Nutrition-Focused physical exam completed. Findings are moderate to severe fat depletion, moderate to severe muscle depletion, and no edema.    Height: Daughter acknowledges wt loss over time, reports pt used to wear size 18/20, now wears 8/10. Not sure UBW.   Ht Readings from Last 1 Encounters:  10/31/14  (1.6 m)    Weight:   Wt Readings from Last 1 Encounters:  10/31/14 115 lb 6.4 oz (52.345 kg)    BMI:  Body mass index is 20.45 kg/(m^2).  Estimated Nutritional Needs:   Kcal:  1340-1583 kcals (BEE 937, 1.3 AF, 1.1-1.3 IF)   Protein:  57-73 g (1.1-1.4 g/kg)   Fluid:  1300-1560 mL (25-30 ml/kg)   HIGH Care Level  Romelle Starcher MS, RD, LDN [phone] Pager

## 2014-11-01 NOTE — Progress Notes (Signed)
Patient was very drowsy and sleepy in the beginning of the shift, but was arousable. After around lunch time patient has become alert and has been awake ever since. She is confused. Continuing IV hydration. Sinus rhythm on tele. Blood sugars have been stable. Foley still in place. Kidney function is trending down. Will continue to monitor.

## 2014-11-01 NOTE — Progress Notes (Signed)
[facility] [place] at [facility]   PATIENT NAME: Jamie Glass    MR#:  [id number]  DATE OF BIRTH:  10/11/1927  SUBJECTIVE:  Came in with increasing lethargy and found to have low bs and elevated creat and Renal failure Per dter progressively weak, poor po intake and increase sleepiness  REVIEW OF SYSTEMS:   Review of Systems  Unable to perform ROS: mental acuity    DRUG ALLERGIES:  No Known Allergies  VITALS:  Blood pressure 118/58, pulse 77, temperature 97.9 F (36.6 C), temperature source Oral, resp. rate 16, height 5\' 3"  (1.6 m), weight 52.345 kg (115 lb 6.4 oz), SpO2 100 %.  PHYSICAL EXAMINATION:   Physical Exam  GENERAL:  81 y.o.-year-old patient lying in the bed with no acute distress.  -very lethargic. Opens eyes momentarily EYES: Pupils equal, round, reactive to light and accommodation. No scleral icterus. Extraocular muscles intact.  HEENT: Head atraumatic, normocephalic. Oropharynx and nasopharynx clear. Dry oral mucosa NECK:  Supple, no jugular venous distention. No thyroid enlargement, no tenderness.  LUNGS: Normal breath sounds bilaterally, no wheezing, rales, rhonchi. No use of accessory muscles of respiration.  CARDIOVASCULAR: S1, S2 normal. No murmurs, rubs, or gallops.  ABDOMEN: Soft, nontender, nondistended. Bowel sounds present. No organomegaly or mass.  EXTREMITIES: No cyanosis, clubbing or edema b/l.    NEUROLOGIC: unable to assess PSYCHIATRIC: lethargic SKIN: No obvious rash, lesion, or ulcer.    LABORATORY PANEL:   CBC  Recent Labs Lab 10/31/14 1744  WBC 8.9  HGB 11.4*  HCT 36.1  PLT 211    Chemistries   Recent Labs Lab 10/31/14 2031  11/01/14 0836  NA 138  < > 142  K 5.5*  < > 5.1  CL 105  < > 112*  CO2 19*  < > 21*  GLUCOSE 219*  < > 148*  BUN 79*  < > 67*  CREATININE 5.09*  < > 4.31*  CALCIUM 9.6  < > 9.0  AST 90*  --   --   ALT 20  --   --   ALKPHOS 48  --   --   BILITOT 1.6*  --   --   < >  = values in this interval not displayed.  Cardiac Enzymes  Recent Labs Lab 10/31/14 1744  TROPONINI 0.04*    RADIOLOGY:  Dg Chest 2 View  10/31/2014   CLINICAL DATA:  Evaluate for pneumonia.  Smoker.  EXAM: CHEST  2 VIEW  COMPARISON:  None.  FINDINGS: The heart size and mediastinal contours are within normal limits. Aortic atherosclerosis identified. Both lungs are clear. The visualized skeletal structures are unremarkable.  IMPRESSION: No evidence for pneumonia.   Electronically Signed   By: Signa Kell M.D.   On: 10/31/2014 18:19   Ct Head Wo Contrast  10/31/2014   CLINICAL DATA:  Confusion beginning at 0600 hours, difficulty answering questions, altered mental status, diabetes mellitus, hypertension, CHF, smoker  EXAM: CT HEAD WITHOUT CONTRAST  TECHNIQUE: Contiguous axial images were obtained from the base of the skull through the vertex without intravenous contrast.  COMPARISON:  11/26/2013  FINDINGS: Generalized atrophy.  Normal ventricular morphology.  No midline shift or mass effect.  Small vessel chronic ischemic changes of deep cerebral white matter.  Small old high RIGHT parietal infarcts.  Old lacunar infarcts at caudate heads bilaterally and at RIGHT external capsule.  No intracranial hemorrhage, mass lesion, or acute infarction.  Visualized paranasal sinuses and mastoid air cells clear.  Bones unremarkable.  Atherosclerotic calcifications at skullbase.  IMPRESSION: Small vessel chronic ischemic changes of deep cerebral white matter.  Multiple small old infarcts as above.  No acute intracranial abnormalities.   Electronically Signed   By: Ulyses Southward M.D.   On: 10/31/2014 17:56     ASSESSMENT AND PLAN:   81 year old African-American female history of type 2 diabetes non-insulin-requiring, essential hypertension, dementia presenting with worsening mental status. Emergency department noted to have markedly elevated potassium as well as creatinine.  1. Acute renal failure:appears  pre-renal azotemia -baseline creat 0.9 Came in with creat 5.0--->4.7 -cont IV fluid, follow renal function, avoid nephrotoxic agents including further Lasix, Ace inhibitors, NSAIDs.   2. Hyperkalemia: Hold potassium replacement that she was continuously getting, IV fluid hydration, artery treated appropriately by emergency department staff, follow BMP every 6 hours to ensure trending down of potassium if it is not appropriately moving will need to re-dose medications if proves refractory may require dialysis in the future  3. Essential hypertension: Was relatively hypotensive upon arrival, will hold her antihypertensives medications and restart them cautiously, once again avoiding the ACE inhibitor and diuretic for now  4. Hyperlipidemia unspecified: Pravastatin  5. Venous thromboembolism prophylactic: Heparin subcutaneous  6. Hypoglycemia associated type 2 diabetes: D5 infusion, Accu-Cheks every 2 hours until stabilized, hold oral agents Sugars much better will d/c d5 gtt  7.PT to see  8.CM/CSW for d/c planning  Spoke with dter in the room.   Case discussed with Care Management/Social Worker. Management plans discussed with the  family and they are in agreement.  CODE STATUS:full  DVT Prophylaxis: heparin  TOTAL TIME TAKING CARE OF THIS PATIENT: 35 minutes.  >50% time spent on counselling and coordination of care with RN, dter  POSSIBLE D/C IN 2DAYS, DEPENDING ON CLINICAL CONDITION.   Pankaj Haack M.D on 11/01/2014 at 1:40 PM  Between 7am to 6pm - Pager - [phone]  After 6pm go to [email or web address] - password [email or web address] [facility]  [place] [facility]  Office  [phone]  CC: Primary care physician; Rafael Bihari, MD

## 2014-11-01 NOTE — Progress Notes (Signed)
Pt skin is dry and warm to touch.  Some scratches to right side of face and knees noted. Ecchymosis to multiple sites: right eyebrow, right leg, hip, right upper arm and back noted. Pt's daughter refuses any falls. Pt is confused and does not respond to assessment questions. Lives at home with daughter and son. Assessment information provided by daughter at bedside.

## 2014-11-01 NOTE — Care Management (Signed)
Patient presents from home with Stage IV kidney disease.  On admission, her potassium was 7.4 and creatinine 5.52.  She received kayexelate.  Patient's cousin informed care manager that patient's son and daughter live with patient.  Patient's daughter is returned.  It is documented that  On a previous occasion family had discussed not starting any dialysis.  Patient has dementia.  Daughter is on site but out of the room.  Will speak with her when she returns to patient's room.

## 2014-11-01 NOTE — Clinical Documentation Improvement (Addendum)
Hospitalist  Can the diagnosis of altered mental status be further specified?   Confusion/delirium (including drug induced)  Encephalopathy - Alcoholic, Anoxic/Hypoxia, Drug Induced/Toxic (specify drug), Hepatic, Hypertensive, Hypoglycemic, Metabolic/Septic, Traumatic/post concussive, Wernicke, Other  Other  Clinically Undetermined  Document any associated diagnoses/conditions.  Supporting Information: Per H&P: "presents for evaluation of altered mental status today, gradual onset, constant. Daughter reports that she has been more confused today since approximately 6 AM. She does not know what date it is, thinks it is her birthday. She has been "picking at things". She is not talking as muchas she usually does. She went to physical therapy  Today when she was noted to be hypotensive and was sent to the emergency department for further evaluation."    Please exercise your independent, professional judgment when responding. A specific answer is not anticipated or expected.   Thank You,  Cruzita Lederer, RN CDS [facility] Information Management [place]     Encephalopathy is metabolic due to acute renal fialure

## 2014-11-01 NOTE — Progress Notes (Signed)
Subjective:  Patient is followed by Dr. Wynelle Link as outpatient for chronic kidney disease. She has stage IV renal disease. Her baseline creatinine is 2.17/GFR of 23 back in December 2015. She did not follow-up after that. This time she presents with weakness, hypotension and drowsiness. She was undergoing physical therapy yesterday. Her daughter brought her in for evaluation. Upon arrival, patient is noted to have severely elevated creatinine of 5.52 and severe hyperkalemia of 7.4. She was treated with shifting measures as well as Kayexalate. This morning, her potassium level has been corrected Serum creatinine is slightly improved to 4.52 Patient's daughter is at bedside who is providing most of the history   Objective:  Vital signs in last 24 hours:  Temp:  [97.5 F (36.4 C)-98.2 F (36.8 C)] 97.9 F (36.6 C) (08/26 1116) Pulse Rate:  [60-103] 77 (08/26 1116) Resp:  [16-34] 16 (08/26 1116) BP: (85-164)/(40-94) 118/58 mmHg (08/26 1116) SpO2:  [90 %-100 %] 100 % (08/26 1116) Weight:  [52.345 kg (115 lb 6.4 oz)-56.246 kg (124 lb)] 52.345 kg (115 lb 6.4 oz) (08/25 2216)  Weight change:  Filed Weights   10/31/14 1714 10/31/14 2216  Weight: 56.246 kg (124 lb) 52.345 kg (115 lb 6.4 oz)    Intake/Output:    Intake/Output Summary (Last 24 hours) at 11/01/14 1134 Last data filed at 11/01/14 0830  Gross per 24 hour  Intake      0 ml  Output   1400 ml  Net  -1400 ml     Physical Exam: General:  thin, cachectic, chronically ill-appearing   HEENT  temporal muscle wasting, moist oral mucous membranes   Neck  supple, no masses   Pulm/lungs  clear to auscultation bilaterally, normal effort   CVS/Heart  regular rhythm, no rub or gallop   Abdomen:   soft, nontender, nondistended   Extremities:  no peripheral edema   Neurologic:  sleepy but arousable,   Skin:  no acute rashes   Access:     Foley present     Basic Metabolic Panel:   Recent Labs Lab 10/31/14 1744 10/31/14 2031  11/01/14 0249 11/01/14 0836  NA 136 138 140 142  K 7.4* 5.5* 5.1 5.1  CL 104 105 114* 112*  CO2 17* 19* 17* 21*  GLUCOSE 27* 219* 143* 148*  BUN 83* 79* 72* 67*  CREATININE 5.52* 5.09* 4.52* 4.31*  CALCIUM 10.2 9.6 9.0 9.0     CBC:  Recent Labs Lab 10/31/14 1744  WBC 8.9  NEUTROABS 7.6*  HGB 11.4*  HCT 36.1  MCV 82.2  PLT 211      Microbiology:  Recent Results (from the past 720 hour(s))  Blood culture (routine x 2)     Status: None (Preliminary result)   Collection Time: 10/31/14  5:44 PM  Result Value Ref Range Status   Specimen Description BLOOD LEFT ARM  Final   Special Requests BOTTLES DRAWN AEROBIC AND ANAEROBIC 10CC  Final   Culture NO GROWTH < 24 HOURS  Final   Report Status PENDING  Incomplete  Blood culture (routine x 2)     Status: None (Preliminary result)   Collection Time: 10/31/14  5:44 PM  Result Value Ref Range Status   Specimen Description BLOOD RIGHT ARM  Final   Special Requests BOTTLES DRAWN AEROBIC AND ANAEROBIC 2CC  Final   Culture NO GROWTH < 24 HOURS  Final   Report Status PENDING  Incomplete  C difficile quick scan w PCR reflex  Status: None   Collection Time: 11/01/14  4:49 AM  Result Value Ref Range Status   C Diff antigen NEGATIVE NEGATIVE Final   C Diff toxin NEGATIVE NEGATIVE Final   C Diff interpretation Negative for C. difficile  Final    Coagulation Studies:  Recent Labs  10/31/14 1744  LABPROT 15.2*  INR 1.18    Urinalysis:  Recent Labs  10/31/14 1945  COLORURINE YELLOW*  LABSPEC 1.012  PHURINE 5.0  GLUCOSEU 150*  HGBUR 2+*  BILIRUBINUR NEGATIVE  KETONESUR NEGATIVE  PROTEINUR NEGATIVE  NITRITE NEGATIVE  LEUKOCYTESUR NEGATIVE      Imaging: Dg Chest 2 View  10/31/2014   CLINICAL DATA:  Evaluate for pneumonia.  Smoker.  EXAM: CHEST  2 VIEW  COMPARISON:  None.  FINDINGS: The heart size and mediastinal contours are within normal limits. Aortic atherosclerosis identified. Both lungs are clear. The  visualized skeletal structures are unremarkable.  IMPRESSION: No evidence for pneumonia.   Electronically Signed   By: Signa Kell M.D.   On: 10/31/2014 18:19   Ct Head Wo Contrast  10/31/2014   CLINICAL DATA:  Confusion beginning at 0600 hours, difficulty answering questions, altered mental status, diabetes mellitus, hypertension, CHF, smoker  EXAM: CT HEAD WITHOUT CONTRAST  TECHNIQUE: Contiguous axial images were obtained from the base of the skull through the vertex without intravenous contrast.  COMPARISON:  11/26/2013  FINDINGS: Generalized atrophy.  Normal ventricular morphology.  No midline shift or mass effect.  Small vessel chronic ischemic changes of deep cerebral white matter.  Small old high RIGHT parietal infarcts.  Old lacunar infarcts at caudate heads bilaterally and at RIGHT external capsule.  No intracranial hemorrhage, mass lesion, or acute infarction.  Visualized paranasal sinuses and mastoid air cells clear.  Bones unremarkable.  Atherosclerotic calcifications at skullbase.  IMPRESSION: Small vessel chronic ischemic changes of deep cerebral white matter.  Multiple small old infarcts as above.  No acute intracranial abnormalities.   Electronically Signed   By: Ulyses Southward M.D.   On: 10/31/2014 17:56     Medications:   . sodium chloride 125 mL/hr at 11/01/14 1047   . aspirin EC  81 mg Oral Daily  . gabapentin  100 mg Oral BID  . heparin  5,000 Units Subcutaneous 3 times per day  . insulin aspart  0-5 Units Subcutaneous QHS  . insulin aspart  0-9 Units Subcutaneous TID WC  . isosorbide mononitrate  30 mg Oral Daily  . mirtazapine  7.5 mg Oral QHS  . OLANZapine zydis  5 mg Oral QHS  . pravastatin  20 mg Oral Daily  . sodium chloride  3 mL Intravenous Q12H   acetaminophen **OR** acetaminophen, ondansetron **OR** ondansetron (ZOFRAN) IV, oxyCODONE  Assessment/ Plan:  81 y.o. African-American female with chronic kidney disease stage IV, type 2 diabetes, hypertension, dementia  presents with weakness, hypotension and is found to have acute renal failure and severe hyperkalemia  1. Acute renal failure on chronic kidney disease stage IV. Likely secondary to hypotension and volume depletion. Patient's home medications include amlodipine, Lasix, isosorbide, lisinopril, metoprolol Regimen will need to be optimized. Goal blood pressure in this patient is likely 140-150 systolic as tolerated Patient's daughter mentioned that she has discussed this previously with Dr. Wynelle Link. Due to her chronic illnesses, general poor health, advanced dementia, non-dialytic management has been elected.  2. Severe hyperkalemia - Treated with shifting measures and Kayexalate - Patient was on potassium supplementation as outpatient - We'll discontinue potassium - Monitor  3. Hypertension - Recommend blood pressure goal systolic 140-150  4. Diabetes type 2 with CKD - Would not restart metformin as outpatient due to advanced chronic kidney disease   LOS: 1 Jayanth Szczesniak 8/26/201611:34 AM

## 2014-11-01 NOTE — Evaluation (Signed)
Physical Therapy Evaluation Patient Details Name: Jamie Glass MRN: [id number] DOB: April 19, 1927 Today's Date: 11/01/2014   History of Present Illness  AMS, renal failure, arrived with glucose of 27  Clinical Impression  Pt confused and family member present does state that this appears even more altered than at baseline. She is initially fearful and struggles to get up to standing secondary to leaning back and inability to keep feet on the ground.  She ends up being able to take a few fwd/back steps but her confusion is her biggest limiter at this time.     Follow Up Recommendations Home health PT;Supervision/Assistance - 24 hour    Equipment Recommendations   (already has walker)    Recommendations for Other Services       Precautions / Restrictions Precautions Precautions: Fall Restrictions Weight Bearing Restrictions: No      Mobility  Bed Mobility Overal bed mobility: Needs Assistance Bed Mobility: Supine to Sit;Sit to Supine     Supine to sit: Mod assist;Max assist Sit to supine: Mod assist;Max assist      Transfers Overall transfer level: Needs assistance Equipment used: Rolling walker (2 wheeled) Transfers: Sit to/from Stand Sit to Stand: Max assist;Mod assist         General transfer comment: Pt leaning back, struggles to keep feet on ground (despite much cuing and set up) and generally shows anxiousness about standing  Ambulation/Gait Ambulation/Gait assistance: Mod assist Ambulation Distance (Feet): 8 Feet Assistive device: Rolling walker (2 wheeled)       General Gait Details: Pt with 2 bouts of a few steps forward and back but is not very confident the entire time.  She is able to shift her weight forward to appropriately use walker after much cuing, but still generally keeps weight backward and needs assist for maintaining upright  Stairs            Wheelchair Mobility    Modified Rankin (Stroke Patients Only)       Balance                                             Pertinent Vitals/Pain Pain Assessment: No/denies pain    Home Living Family/patient expects to be discharged to:: Private residence Living Arrangements: Children Available Help at Discharge: Family   Home Access:  ("a few steps")              Prior Function Level of Independence:  (pt's cousin present, states pt did not do much but did walk)               Hand Dominance        Extremity/Trunk Assessment   Upper Extremity Assessment: Generalized weakness           Lower Extremity Assessment: Generalized weakness         Communication      Cognition Arousal/Alertness:  (very confused) Behavior During Therapy: Anxious Overall Cognitive Status: Impaired/Different from baseline                      General Comments      Exercises        Assessment/Plan    PT Assessment Patient needs continued PT services  PT Diagnosis Difficulty walking;Generalized weakness   PT Problem List Decreased strength;Decreased activity tolerance;Decreased balance;Decreased mobility;Decreased cognition;Decreased safety awareness  PT Treatment Interventions  Balance training;Therapeutic exercise;Therapeutic activities;Functional mobility training;Stair training;Gait training   PT Goals (Current goals can be found in the Care Plan section) Acute Rehab PT Goals Patient Stated Goal: none stated PT Goal Formulation: With patient/family Time For Goal Achievement: 11/15/14 Potential to Achieve Goals: Good    Frequency Min 2X/week   Barriers to discharge        Co-evaluation               End of Session Equipment Utilized During Treatment: Gait belt Activity Tolerance: Patient tolerated treatment well Patient left: with bed alarm set           Time: 1205-1229 PT Time Calculation (min) (ACUTE ONLY): 24 min   Charges:   PT Evaluation $Initial PT Evaluation Tier I: 1 Procedure     PT G  Codes:       Loran Senters, PT, DPT [phone]  Malachi Pro 11/01/2014, 2:55 PM

## 2014-11-01 NOTE — Progress Notes (Signed)
Pt having loose stools. MD, diamond aware.

## 2014-11-02 ENCOUNTER — Inpatient Hospital Stay: Payer: Medicare Other

## 2014-11-02 DIAGNOSIS — E43 Unspecified severe protein-calorie malnutrition: Secondary | ICD-10-CM

## 2014-11-02 LAB — BASIC METABOLIC PANEL
ANION GAP: 10 (ref 5–15)
BUN: 52 mg/dL — ABNORMAL HIGH (ref 6–20)
CHLORIDE: 113 mmol/L — AB (ref 101–111)
CO2: 19 mmol/L — AB (ref 22–32)
Calcium: 8.7 mg/dL — ABNORMAL LOW (ref 8.9–10.3)
Creatinine, Ser: 2.75 mg/dL — ABNORMAL HIGH (ref 0.44–1.00)
GFR calc non Af Amer: 15 mL/min — ABNORMAL LOW (ref >60)
GFR, EST AFRICAN AMERICAN: 17 mL/min — AB (ref >60)
Glucose, Bld: 129 mg/dL — ABNORMAL HIGH (ref 65–99)
POTASSIUM: 4.4 mmol/L (ref 3.5–5.1)
Sodium: 142 mmol/L (ref 135–145)

## 2014-11-02 LAB — GLUCOSE, CAPILLARY
GLUCOSE-CAPILLARY: 82 mg/dL (ref 65–99)
GLUCOSE-CAPILLARY: 98 mg/dL (ref 65–99)
GLUCOSE-CAPILLARY: 130 mg/dL — AB (ref 65–99)
Glucose-Capillary: 90 mg/dL (ref 65–99)

## 2014-11-02 LAB — UREA NITROGEN, URINE: Urea Nitrogen, Ur: 293 mg/dL

## 2014-11-02 MED ORDER — HALOPERIDOL LACTATE 5 MG/ML IJ SOLN
5.0000 mg | Freq: Once | INTRAMUSCULAR | Status: AC
  Administered 2014-11-02: 5 mg via INTRAVENOUS
  Filled 2014-11-02: qty 1

## 2014-11-02 MED ORDER — SODIUM CHLORIDE 0.9 % IV SOLN
INTRAVENOUS | Status: AC
  Administered 2014-11-02 – 2014-11-03 (×3): via INTRAVENOUS

## 2014-11-02 MED ORDER — LORAZEPAM 2 MG/ML IJ SOLN
0.5000 mg | Freq: Once | INTRAMUSCULAR | Status: AC
  Administered 2014-11-02: 0.5 mg via INTRAMUSCULAR
  Filled 2014-11-02: qty 1

## 2014-11-02 MED ORDER — LORAZEPAM 0.5 MG PO TABS: 0.5000 mg | ORAL_TABLET | Freq: Once | ORAL | Status: AC

## 2014-11-02 NOTE — Progress Notes (Signed)
[facility] - [place] at [facility]   PATIENT NAME: Jamie Glass    MR#:  [id number]  DATE OF BIRTH:  02-28-1928  SUBJECTIVE:  Came in with increasing lethargy and found to have low bs and elevated creat and Renal failure Per dter progressively weak, poor po intake and increase sleepiness Restless last nite  REVIEW OF SYSTEMS:   Review of Systems  Unable to perform ROS: mental acuity    DRUG ALLERGIES:  No Known Allergies  VITALS:  Blood pressure 113/54, pulse 92, temperature 97.5 F (36.4 C), temperature source Oral, resp. rate 14, height  (1.6 m), weight 52.345 kg (115 lb 6.4 oz), SpO2 91 %.  PHYSICAL EXAMINATION:   Physical Exam  GENERAL:  81 y.o.-year-old patient lying in the bed with no acute distress.  -very lethargic. Opens eyes momentarily EYES: Pupils equal, round, reactive to light and accommodation. No scleral icterus. Extraocular muscles intact.  HEENT: Head atraumatic, normocephalic. Oropharynx and nasopharynx clear. Dry oral mucosa NECK:  Supple, no jugular venous distention. No thyroid enlargement, no tenderness.  LUNGS: Normal breath sounds bilaterally, no wheezing, rales, rhonchi. No use of accessory muscles of respiration.  CARDIOVASCULAR: S1, S2 normal. No murmurs, rubs, or gallops.  ABDOMEN: Soft, nontender, nondistended. Bowel sounds present. No organomegaly or mass.  EXTREMITIES: No cyanosis, clubbing or edema b/l.    NEUROLOGIC: unable to assess PSYCHIATRIC: lethargic SKIN: No obvious rash, lesion, or ulcer.    LABORATORY PANEL:   CBC  Recent Labs Lab 10/31/14 1744  WBC 8.9  HGB 11.4*  HCT 36.1  PLT 211    Chemistries   Recent Labs Lab 10/31/14 2031  11/02/14 0439  NA 138  < > 142  K 5.5*  < > 4.4  CL 105  < > 113*  CO2 19*  < > 19*  GLUCOSE 219*  < > 129*  BUN 79*  < > 52*  CREATININE 5.09*  < > 2.75*  CALCIUM 9.6  < > 8.7*  AST 90*  --   --   ALT 20  --   --   ALKPHOS 48  --   --   BILITOT  1.6*  --   --   < > = values in this interval not displayed.  Cardiac Enzymes  Recent Labs Lab 10/31/14 1744  TROPONINI 0.04*    RADIOLOGY:  Dg Chest 2 View  10/31/2014   CLINICAL DATA:  Evaluate for pneumonia.  Smoker.  EXAM: CHEST  2 VIEW  COMPARISON:  None.  FINDINGS: The heart size and mediastinal contours are within normal limits. Aortic atherosclerosis identified. Both lungs are clear. The visualized skeletal structures are unremarkable.  IMPRESSION: No evidence for pneumonia.   Electronically Signed   By: Signa Kell M.D.   On: 10/31/2014 18:19   Ct Head Wo Contrast  10/31/2014   CLINICAL DATA:  Confusion beginning at 0600 hours, difficulty answering questions, altered mental status, diabetes mellitus, hypertension, CHF, smoker  EXAM: CT HEAD WITHOUT CONTRAST  TECHNIQUE: Contiguous axial images were obtained from the base of the skull through the vertex without intravenous contrast.  COMPARISON:  11/26/2013  FINDINGS: Generalized atrophy.  Normal ventricular morphology.  No midline shift or mass effect.  Small vessel chronic ischemic changes of deep cerebral white matter.  Small old high RIGHT parietal infarcts.  Old lacunar infarcts at caudate heads bilaterally and at RIGHT external capsule.  No intracranial hemorrhage, mass lesion, or acute infarction.  Visualized paranasal sinuses and mastoid  air cells clear.  Bones unremarkable.  Atherosclerotic calcifications at skullbase.  IMPRESSION: Small vessel chronic ischemic changes of deep cerebral white matter.  Multiple small old infarcts as above.  No acute intracranial abnormalities.   Electronically Signed   By: Ulyses Southward M.D.   On: 10/31/2014 17:56   US Renal  11/02/2014   CLINICAL DATA:  81 year old female with acute kidney injury and acute renal failure.  EXAM: RENAL / URINARY TRACT ULTRASOUND COMPLETE  COMPARISON:  No priors.  FINDINGS: Right Kidney:  Length: 10.1 cm. Diffusely mildly echogenic parenchyma. 1.0 x 0.6 x 1.0 cm  anechoic lesion with increased through transmission in the upper pole, compatible with a tiny simple cyst. No other mass or hydronephrosis visualized.  Left Kidney:  Length: 8.3 cm. Diffusely mildly echogenic parenchyma. No mass or hydronephrosis visualized.  Bladder:  Completely decompressed with a Foley catheter in place.  IMPRESSION: 1. Echogenic kidneys bilaterally, suggesting underlying medical renal disease. 2. No hydronephrosis.   Electronically Signed   By: Trudie Reed M.D.   On: 11/02/2014 10:34     ASSESSMENT AND PLAN:   81 year old African-American female history of type 2 diabetes non-insulin-requiring, essential hypertension, dementia presenting with worsening mental status. Emergency department noted to have markedly elevated potassium as well as creatinine.  1. Acuteon Chronic  renal failure -IV -baseline creat 2.0 Came in with creat 5.0--->4.7-->2.7 -cont IV fluid, follow renal function, avoid nephrotoxic agents including further Lasix, Ace inhibitors, NSAIDs.  -seen by Dr Thedore Mins  2. Hyperkalemia: Hold potassium replacement that she was continuously getting, IV fluid hydration, artery treated appropriately by emergency department staff, follow BMP every 6 hours to ensure trending down of potassium if it is not appropriately moving will need to re-dose medications if proves refractory may require dialysis in the future  3. Essential hypertension: Was relatively hypotensive upon arrival, will hold her antihypertensives medications and restart them cautiously, once again avoiding the ACE inhibitor and diuretic for now  4. Hyperlipidemia unspecified: Pravastatin  5. Venous thromboembolism prophylactic: Heparin subcutaneous  6. Hypoglycemia associated type 2 diabetes: D5 infusion, Accu-Cheks every 2 hours until stabilized, hold oral agents Sugars much better will d/c d5 gtt  7.PT to see  8.CM/CSW for d/c planning  Spoke with dter in the room.   Case discussed with Care  Management/Social Worker. Management plans discussed with the  family and they are in agreement.  CODE STATUS:full  DVT Prophylaxis: heparin  TOTAL TIME TAKING CARE OF THIS PATIENT: 35 minutes.  >50% time spent on counselling and coordination of care with RN, dter  POSSIBLE D/C IN 2DAYS, DEPENDING ON CLINICAL CONDITION.   Rikia Sukhu M.D on 11/02/2014 at 4:35 PM  Between 7am to 6pm - Pager - [phone]  After 6pm go to [email or web address] - password [email or web address] [facility]  [facility]  Office  [phone]  CC: Primary care physician; Rafael Bihari, MD

## 2014-11-02 NOTE — Progress Notes (Signed)
Pt is confused and agitated.  She has attempted to get out of bed without assistance and pull at catheter tubing multiple times.  Pt was unable to redirect multiple times. Dr. Clint Guy was notified and orders were given.

## 2014-11-02 NOTE — Progress Notes (Signed)
Haldol  IV administered for agitation. Pt attempting to pull off Tele box leads, Iv cath, and foley.

## 2014-11-02 NOTE — Progress Notes (Signed)
Subjective:  Patient is followed by Dr. Wynelle Link as outpatient for chronic kidney disease. She has stage IV renal disease. Her baseline creatinine is 2.17/GFR of 23 back in December 2015. She did not follow-up after that. This time she presents with weakness, hypotension and drowsiness. She was undergoing physical therapy yesterday. Her daughter brought her in for evaluation. Upon arrival, patient is noted to have severely elevated creatinine of 5.52 and severe hyperkalemia of 7.4. She was treated with shifting measures as well as Kayexalate.  This morning, her potassium level has been corrected Serum creatinine has significantly improved to 2.75. - Patient's daughter reports that she was confused most of the night.   Objective:  Vital signs in last 24 hours:  Temp:  [97.5 F (36.4 C)-97.8 F (36.6 C)] 97.5 F (36.4 C) (08/27 1126) Pulse Rate:  [92-98] 92 (08/27 0418) Resp:  [14-21] 14 (08/27 1126) BP: (113-156)/(54-73) 113/54 mmHg (08/27 1126) SpO2:  [91 %] 91 % (08/27 0418)  Weight change:  Filed Weights   10/31/14 1714 10/31/14 2216  Weight: 56.246 kg (124 lb) 52.345 kg (115 lb 6.4 oz)    Intake/Output:    Intake/Output Summary (Last 24 hours) at 11/02/14 1201 Last data filed at 11/02/14 0900  Gross per 24 hour  Intake    240 ml  Output    600 ml  Net   -360 ml     Physical Exam: General:  thin, cachectic, chronically ill-appearing   HEENT  temporal muscle wasting, moist oral mucous membranes   Neck  supple, no masses   Pulm/lungs  clear to auscultation bilaterally, normal effort   CVS/Heart  regular rhythm, no rub or gallop   Abdomen:   soft, nontender, nondistended   Extremities:  no peripheral edema   Neurologic:  sleepy but arousable,   Skin:  no acute rashes   Access:     Foley present     Basic Metabolic Panel:   Recent Labs Lab 10/31/14 2031 11/01/14 0249 11/01/14 0836 11/01/14 1542 11/02/14 0439  NA 138 140 142 143 142  K 5.5* 5.1 5.1 4.9 4.4   CL 105 114* 112* 113* 113*  CO2 19* 17* 21* 20* 19*  GLUCOSE 219* 143* 148* 117* 129*  BUN 79* 72* 67* 65* 52*  CREATININE 5.09* 4.52* 4.31* 3.64* 2.75*  CALCIUM 9.6 9.0 9.0 8.8* 8.7*     CBC:  Recent Labs Lab 10/31/14 1744  WBC 8.9  NEUTROABS 7.6*  HGB 11.4*  HCT 36.1  MCV 82.2  PLT 211      Microbiology:  Recent Results (from the past 720 hour(s))  Blood culture (routine x 2)     Status: None (Preliminary result)   Collection Time: 10/31/14  5:44 PM  Result Value Ref Range Status   Specimen Description BLOOD LEFT ARM  Final   Special Requests BOTTLES DRAWN AEROBIC AND ANAEROBIC 10CC  Final   Culture NO GROWTH 2 DAYS  Final   Report Status PENDING  Incomplete  Blood culture (routine x 2)     Status: None (Preliminary result)   Collection Time: 10/31/14  5:44 PM  Result Value Ref Range Status   Specimen Description BLOOD RIGHT ARM  Final   Special Requests BOTTLES DRAWN AEROBIC AND ANAEROBIC 2CC  Final   Culture NO GROWTH 2 DAYS  Final   Report Status PENDING  Incomplete  C difficile quick scan w PCR reflex     Status: None   Collection Time: 11/01/14  4:49 AM  Result Value Ref Range Status   C Diff antigen NEGATIVE NEGATIVE Final   C Diff toxin NEGATIVE NEGATIVE Final   C Diff interpretation Negative for C. difficile  Final    Coagulation Studies:  Recent Labs  10/31/14 1744  LABPROT 15.2*  INR 1.18    Urinalysis:  Recent Labs  10/31/14 1945  COLORURINE YELLOW*  LABSPEC 1.012  PHURINE 5.0  GLUCOSEU 150*  HGBUR 2+*  BILIRUBINUR NEGATIVE  KETONESUR NEGATIVE  PROTEINUR NEGATIVE  NITRITE NEGATIVE  LEUKOCYTESUR NEGATIVE      Imaging: Dg Chest 2 View  10/31/2014   CLINICAL DATA:  Evaluate for pneumonia.  Smoker.  EXAM: CHEST  2 VIEW  COMPARISON:  None.  FINDINGS: The heart size and mediastinal contours are within normal limits. Aortic atherosclerosis identified. Both lungs are clear. The visualized skeletal structures are unremarkable.   IMPRESSION: No evidence for pneumonia.   Electronically Signed   By: Signa Kell M.D.   On: 10/31/2014 18:19   Ct Head Wo Contrast  10/31/2014   CLINICAL DATA:  Confusion beginning at 0600 hours, difficulty answering questions, altered mental status, diabetes mellitus, hypertension, CHF, smoker  EXAM: CT HEAD WITHOUT CONTRAST  TECHNIQUE: Contiguous axial images were obtained from the base of the skull through the vertex without intravenous contrast.  COMPARISON:  11/26/2013  FINDINGS: Generalized atrophy.  Normal ventricular morphology.  No midline shift or mass effect.  Small vessel chronic ischemic changes of deep cerebral white matter.  Small old high RIGHT parietal infarcts.  Old lacunar infarcts at caudate heads bilaterally and at RIGHT external capsule.  No intracranial hemorrhage, mass lesion, or acute infarction.  Visualized paranasal sinuses and mastoid air cells clear.  Bones unremarkable.  Atherosclerotic calcifications at skullbase.  IMPRESSION: Small vessel chronic ischemic changes of deep cerebral white matter.  Multiple small old infarcts as above.  No acute intracranial abnormalities.   Electronically Signed   By: Ulyses Southward M.D.   On: 10/31/2014 17:56   US Renal  11/02/2014   CLINICAL DATA:  81 year old female with acute kidney injury and acute renal failure.  EXAM: RENAL / URINARY TRACT ULTRASOUND COMPLETE  COMPARISON:  No priors.  FINDINGS: Right Kidney:  Length: 10.1 cm. Diffusely mildly echogenic parenchyma. 1.0 x 0.6 x 1.0 cm anechoic lesion with increased through transmission in the upper pole, compatible with a tiny simple cyst. No other mass or hydronephrosis visualized.  Left Kidney:  Length: 8.3 cm. Diffusely mildly echogenic parenchyma. No mass or hydronephrosis visualized.  Bladder:  Completely decompressed with a Foley catheter in place.  IMPRESSION: 1. Echogenic kidneys bilaterally, suggesting underlying medical renal disease. 2. No hydronephrosis.   Electronically Signed    By: Trudie Reed M.D.   On: 11/02/2014 10:34     Medications:   . sodium chloride 125 mL/hr at 11/02/14 1200   . aspirin EC  81 mg Oral Daily  . feeding supplement (GLUCERNA SHAKE)  237 mL Oral TID WC  . gabapentin  100 mg Oral BID  . heparin  5,000 Units Subcutaneous 3 times per day  . insulin aspart  0-5 Units Subcutaneous QHS  . insulin aspart  0-9 Units Subcutaneous TID WC  . isosorbide mononitrate  30 mg Oral Daily  . mirtazapine  7.5 mg Oral QHS  . OLANZapine zydis  5 mg Oral QHS  . pravastatin  20 mg Oral Daily  . sodium chloride  3 mL Intravenous Q12H   acetaminophen **OR** acetaminophen, ondansetron **OR** ondansetron (ZOFRAN) IV, oxyCODONE  Assessment/ Plan:  81 y.o. African-American female with chronic kidney disease stage IV, type 2 diabetes, hypertension, dementia presents with weakness, hypotension and is found to have acute renal failure and severe hyperkalemia  1. Acute renal failure on chronic kidney disease stage IV. Likely secondary to hypotension and volume depletion. Patient's home medications include amlodipine, Lasix, isosorbide, lisinopril, metoprolol Regimen will need to be optimized. Goal blood pressure in this patient is likely 140-150 systolic as tolerated Patient's daughter mentioned that she has discussed this previously with Dr. Wynelle Link. Due to her chronic illnesses, general poor health, advanced dementia, non-dialytic management has been elected. - Serum creatinine improved significantly and is getting close to baseline  2. Severe hyperkalemia - Treated with shifting measures and Kayexalate - Patient was on potassium supplementation as outpatient - Please do not restart potassium supplementation  3. Hypertension - Recommend blood pressure goal systolic 140-150  4. Diabetes type 2 with CKD - Would not restart metformin as outpatient due to advanced chronic kidney disease - Follow up as outpatient  5. Patient currently has a Foley. She will  need a voiding trial before discharge   LOS: 2 Amoree Newlon 8/27/201612:01 PM

## 2014-11-02 NOTE — Progress Notes (Addendum)
Pt is still agitated and awake. Pt pulled IV Cath. Out for the second time.MD. Betti Cruz, aware. Order received for ativan 0.5 IM once to be administered.

## 2014-11-02 NOTE — Clinical Social Work Note (Signed)
CSW consulted by MD for possible STR. According to RN CM, patient has 24/7 care at home and patient's daughter wishes to take patient back home when discharged and wishes to have home health follow her. CSW will defer assessment at this time. No further needs by CSW. York Spaniel MSW,LCSW [phone]

## 2014-11-03 DIAGNOSIS — L8990 Pressure ulcer of unspecified site, unspecified stage: Secondary | ICD-10-CM

## 2014-11-03 LAB — BASIC METABOLIC PANEL
ANION GAP: 4 — AB (ref 5–15)
BUN: 33 mg/dL — ABNORMAL HIGH (ref 6–20)
CALCIUM: 8.5 mg/dL — AB (ref 8.9–10.3)
CO2: 21 mmol/L — AB (ref 22–32)
Chloride: 117 mmol/L — ABNORMAL HIGH (ref 101–111)
Creatinine, Ser: 1.76 mg/dL — ABNORMAL HIGH (ref 0.44–1.00)
GFR, EST AFRICAN AMERICAN: 29 mL/min — AB (ref >60)
GFR, EST NON AFRICAN AMERICAN: 25 mL/min — AB (ref >60)
GLUCOSE: 102 mg/dL — AB (ref 65–99)
POTASSIUM: 4.1 mmol/L (ref 3.5–5.1)
Sodium: 142 mmol/L (ref 135–145)

## 2014-11-03 LAB — GLUCOSE, CAPILLARY
GLUCOSE-CAPILLARY: 181 mg/dL — AB (ref 65–99)
Glucose-Capillary: 88 mg/dL (ref 65–99)

## 2014-11-03 MED ORDER — LORAZEPAM 2 MG/ML IJ SOLN
1.0000 mg | Freq: Once | INTRAMUSCULAR | Status: AC
  Administered 2014-11-03: 1 mg via INTRAVENOUS
  Filled 2014-11-03: qty 1

## 2014-11-03 MED ORDER — DIPHENHYDRAMINE HCL 50 MG/ML IJ SOLN
25.0000 mg | Freq: Once | INTRAMUSCULAR | Status: AC
  Administered 2014-11-03: 25 mg via INTRAVENOUS
  Filled 2014-11-03: qty 0.5

## 2014-11-03 NOTE — Care Management Note (Signed)
Case Management Note  Patient Details  Name: Jamie Glass MRN: [id number] Date of Birth: 06-12-1927  Subjective/Objective:   Discussed discharge planning with daughter Jamie Glass. Confirmed with her the original plan for [facility] to provide home Health PT, RN, Aid services at [facility] home after discharge. A referral was faxed to [facility] requesting PT, RN, Aid.                   Action/Plan:   Expected Discharge Date:                  Expected Discharge Plan:     In-House Referral:     Discharge planning Services     Post Acute Care Choice:    Choice offered to:     DME Arranged:    DME Agency:     HH Arranged:    HH Agency:     Status of Service:     Medicare Important Message Given:    Date Medicare IM Given:    Medicare IM give by:    Date Additional Medicare IM Given:    Additional Medicare Important Message give by:     If discussed at Long Length of Stay Meetings, dates discussed:    Additional Comments:  Vikrant Pryce A, RN 11/03/2014, 12:49 PM

## 2014-11-03 NOTE — Care Management (Signed)
Discharge order is present.  Faxed final referral information to [facility].  Patient and daughter in agreement

## 2014-11-03 NOTE — Progress Notes (Signed)
Discharge instructions given to patient and daughter. Education given on kidney failure, diet, and diabetes. No new medications. Patient instructed not to take metformin and glipizide until she sees her primary care physician to determine what diabetes med to take due to kidney function. Home health has been set up per care management. Foley removed at 1220 and patient had an incontinent episode and voided in bedside commode. No further questions. IV and tele removed, returned to desk.

## 2014-11-03 NOTE — Progress Notes (Signed)
Pt continued being agitated and confused.  She continued to try and jump out of bed multiple times.  Dr. Clint Guy was notified and orders were given.

## 2014-11-03 NOTE — Progress Notes (Signed)
Subjective:  Patient is followed by Dr. Wynelle Link as outpatient for chronic kidney disease. She has stage IV renal disease. Her baseline creatinine is 2.17/GFR of 23 back in December 2015. She did not follow-up after that. This time she presents with weakness, hypotension and drowsiness. She was undergoing physical therapy yesterday. Her daughter brought her in for evaluation. Upon arrival, patient is noted to have severely elevated creatinine of 5.52 and severe hyperkalemia of 7.4. She was treated with shifting measures as well as Kayexalate.  This morning, her potassium level has been corrected Serum creatinine has significantly improved to 2.75- 1.76 - Patient's daughter and nursing staff reports that she was confused again at night.   Objective:  Vital signs in last 24 hours:  Temp:  [97.6 F (36.4 C)-97.8 F (36.6 C)] 97.8 F (36.6 C) (08/28 1120) Pulse Rate:  [78-88] 88 (08/28 1120) Resp:  [17-20] 20 (08/28 1120) BP: (151-159)/(70-74) 151/72 mmHg (08/28 1120) SpO2:  [99 %-100 %] 100 % (08/28 1120)  Weight change:  Filed Weights   10/31/14 1714 10/31/14 2216  Weight: 56.246 kg (124 lb) 52.345 kg (115 lb 6.4 oz)    Intake/Output:    Intake/Output Summary (Last 24 hours) at 11/03/14 1158 Last data filed at 11/03/14 1100  Gross per 24 hour  Intake 2368.75 ml  Output   2550 ml  Net -181.25 ml     Physical Exam: General:  thin, cachectic, chronically ill-appearing   HEENT  temporal muscle wasting, moist oral mucous membranes   Neck  supple, no masses   Pulm/lungs  clear to auscultation bilaterally, normal effort   CVS/Heart  regular rhythm, no rub or gallop   Abdomen:   soft, nontender, nondistended   Extremities:  no peripheral edema   Neurologic:  sleepy but arousable,   Skin:  no acute rashes   Access:     Foley present     Basic Metabolic Panel:   Recent Labs Lab 11/01/14 0249 11/01/14 0836 11/01/14 1542 11/02/14 0439 11/03/14 0458  NA 140 142 143 142  142  K 5.1 5.1 4.9 4.4 4.1  CL 114* 112* 113* 113* 117*  CO2 17* 21* 20* 19* 21*  GLUCOSE 143* 148* 117* 129* 102*  BUN 72* 67* 65* 52* 33*  CREATININE 4.52* 4.31* 3.64* 2.75* 1.76*  CALCIUM 9.0 9.0 8.8* 8.7* 8.5*     CBC:  Recent Labs Lab 10/31/14 1744  WBC 8.9  NEUTROABS 7.6*  HGB 11.4*  HCT 36.1  MCV 82.2  PLT 211      Microbiology:  Recent Results (from the past 720 hour(s))  Blood culture (routine x 2)     Status: None (Preliminary result)   Collection Time: 10/31/14  5:44 PM  Result Value Ref Range Status   Specimen Description BLOOD LEFT ARM  Final   Special Requests BOTTLES DRAWN AEROBIC AND ANAEROBIC 10CC  Final   Culture NO GROWTH 2 DAYS  Final   Report Status PENDING  Incomplete  Blood culture (routine x 2)     Status: None (Preliminary result)   Collection Time: 10/31/14  5:44 PM  Result Value Ref Range Status   Specimen Description BLOOD RIGHT ARM  Final   Special Requests BOTTLES DRAWN AEROBIC AND ANAEROBIC 2CC  Final   Culture NO GROWTH 2 DAYS  Final   Report Status PENDING  Incomplete  C difficile quick scan w PCR reflex     Status: None   Collection Time: 11/01/14  4:49 AM  Result Value  Ref Range Status   C Diff antigen NEGATIVE NEGATIVE Final   C Diff toxin NEGATIVE NEGATIVE Final   C Diff interpretation Negative for C. difficile  Final    Coagulation Studies:  Recent Labs  10/31/14 1744  LABPROT 15.2*  INR 1.18    Urinalysis:  Recent Labs  10/31/14 1945  COLORURINE YELLOW*  LABSPEC 1.012  PHURINE 5.0  GLUCOSEU 150*  HGBUR 2+*  BILIRUBINUR NEGATIVE  KETONESUR NEGATIVE  PROTEINUR NEGATIVE  NITRITE NEGATIVE  LEUKOCYTESUR NEGATIVE      Imaging: US Renal  11/02/2014   CLINICAL DATA:  81 year old female with acute kidney injury and acute renal failure.  EXAM: RENAL / URINARY TRACT ULTRASOUND COMPLETE  COMPARISON:  No priors.  FINDINGS: Right Kidney:  Length: 10.1 cm. Diffusely mildly echogenic parenchyma. 1.0 x 0.6 x 1.0 cm  anechoic lesion with increased through transmission in the upper pole, compatible with a tiny simple cyst. No other mass or hydronephrosis visualized.  Left Kidney:  Length: 8.3 cm. Diffusely mildly echogenic parenchyma. No mass or hydronephrosis visualized.  Bladder:  Completely decompressed with a Foley catheter in place.  IMPRESSION: 1. Echogenic kidneys bilaterally, suggesting underlying medical renal disease. 2. No hydronephrosis.   Electronically Signed   By: Trudie Reed M.D.   On: 11/02/2014 10:34     Medications:   . sodium chloride 125 mL/hr at 11/03/14 0657   . aspirin EC  81 mg Oral Daily  . feeding supplement (GLUCERNA SHAKE)  237 mL Oral TID WC  . gabapentin  100 mg Oral BID  . heparin  5,000 Units Subcutaneous 3 times per day  . insulin aspart  0-5 Units Subcutaneous QHS  . insulin aspart  0-9 Units Subcutaneous TID WC  . isosorbide mononitrate  30 mg Oral Daily  . mirtazapine  7.5 mg Oral QHS  . OLANZapine zydis  5 mg Oral QHS  . pravastatin  20 mg Oral Daily  . sodium chloride  3 mL Intravenous Q12H   acetaminophen **OR** acetaminophen, ondansetron **OR** ondansetron (ZOFRAN) IV, oxyCODONE  Assessment/ Plan:  81 y.o. African-American female with chronic kidney disease stage IV, type 2 diabetes, hypertension, dementia presents with weakness, hypotension and is found to have acute renal failure and severe hyperkalemia  1. Acute renal failure on chronic kidney disease stage IV. Likely secondary to hypotension and volume depletion. Patient's home medications include amlodipine, Lasix, isosorbide, lisinopril, metoprolol Regimen will need to be optimized. Goal blood pressure in this patient is likely 140-150 systolic as tolerated Patient's daughter mentioned that she has discussed this previously with Dr. Wynelle Link. Due to her chronic illnesses, general poor health, advanced dementia, non-dialytic management has been elected. - Serum creatinine improved significantly and is  getting close to baseline  2. Severe hyperkalemia - Treated with shifting measures and Kayexalate - Patient was on potassium supplementation as outpatient - Please do not restart potassium supplementation  3. Hypertension - Recommend blood pressure goal systolic 140-150  4. Diabetes type 2 with CKD - Would not restart metformin as outpatient due to advanced chronic kidney disease - Follow up as outpatient  5. Patient currently has a Foley. She will need a voiding trial before discharge   LOS: 3 Kesha Hurrell 8/28/201611:58 AM

## 2014-11-03 NOTE — Progress Notes (Signed)
Patient was very confused this morning and was hallucinating. Was given haldol and ativan on night shift. Patient fell asleep mid morning and has been asleep the whole entire shift until around 1800. Vitals are stable. Sinus rhythm on tele. Patient did wake up to eat a little dinner. IV fluids continued. Per Dr. Allena Katz, leave foley in another day while she is receiving fluids and is so confused. Renal function is improving. Will continue to monitor.

## 2014-11-03 NOTE — Discharge Instructions (Signed)
Encourage fluid intake to improve kidney function.     DIET:  Regular diet  DISCHARGE CONDITION:  Fair  ACTIVITY:  Activity as tolerated  OXYGEN:  Home Oxygen: No.   Oxygen Delivery: room air  DISCHARGE LOCATION:  home   If you experience worsening of your admission symptoms, develop shortness of breath, life threatening emergency, suicidal or homicidal thoughts you must seek medical attention immediately by calling 911 or calling your MD immediately  if symptoms less severe.  You Must read complete instructions/literature along with all the possible adverse reactions/side effects for all the Medicines you take and that have been prescribed to you. Take any new Medicines after you have completely understood and accpet all the possible adverse reactions/side effects.   Please note  You were cared for by a hospitalist during your hospital stay. If you have any questions about your discharge medications or the care you received while you were in the hospital after you are discharged, you can call the unit and asked to speak with the hospitalist on call if the hospitalist that took care of you is not available. Once you are discharged, your primary care physician will handle any further medical issues. Please note that NO REFILLS for any discharge medications will be authorized once you are discharged, as it is imperative that you return to your primary care physician (or establish a relationship with a primary care physician if you do not have one) for your aftercare needs so that they can reassess your need for medications and monitor your lab values.  [facility]- [phone]:  NURSE, PHYSICAL THERAPY (AIDE is on the referral but patient may not need.)

## 2014-11-04 NOTE — Discharge Summary (Signed)
[facility] - [place] at [facility]  DISCHARGE SUMMARY   PATIENT NAME: Jamie Glass    MR#:  [id number]  DATE OF BIRTH:  Mar 27, 1927  DATE OF ADMISSION:  10/31/2014 ADMITTING PHYSICIAN: Wyatt Haste, MD  DATE OF DISCHARGE:  11/03/2014  PRIMARY CARE PHYSICIAN: Rafael Bihari, MD    ADMISSION DIAGNOSIS:  Acute hyperkalemia [E87.5] Hypoglycemia [E16.2] SIRS (systemic inflammatory response syndrome) [A41.9] Acute kidney injury [N17.9] Acute renal failure, unspecified acute renal failure type [N17.9] Altered mental status, unspecified altered mental status type [R41.82]  DISCHARGE DIAGNOSIS:  Active Problems:   Acute renal failure   Hyperkalemia   Protein-calorie malnutrition, severe   Pressure ulcer   SECONDARY DIAGNOSIS:   Past Medical History  Diagnosis Date  . Anxiety   . Diabetes mellitus without complication   . Hypertension   . CHF (congestive heart failure)   . Arthritis   . Chronic kidney disease   . Stress headaches 08/21/2014    HOSPITAL COURSE:    1. Acute on Chronic renal failure stage IV due to hypotension and volume depletion: Baseline creatinine 2.0. Presented with creatinine of 5.0 now down to 1.76 with IV fluids. Continue to hold Lasix, ACE inhibitor inhibitors and non-steroidal anti-inflammatory for now. She was seen by nephrology during the hospitalization, but did not require dialysis. She will continue to follow-up with nephrology as an outpatient. She will see her primary care provider in the next week to follow blood pressure and renal function.  2. Hyperkalemia: Hold potassium replacement and lisinopril that she was continuously getting. On discharge her potassium is normal at 4.1  3. Essential hypertension: Was relatively hypotensive upon arrival, and blood pressure has continued to be controlled throughout the hospitalization. I have not restarted her ACE inhibitor or diarrhetic. She will follow up with primary  care this week.  4. Hyperlipidemia unspecified: Pravastatin  5. Diabetes mellitus type 2: On presentation she was fairly hypoglycemic and required D5 infusion. On discharge I have continued to hold hypoglycemic agents as her blood sugars have been well controlled in the hospital without them. She can follow with primary care for further assessment. She is cared for by her daughter who keeps close tabs on blood sugars. She has also not been eating well at home and has significant weight loss  6. Dementia: Patient has baseline dementia and is cared for at home. She had significant anxiety during hospitalization particularly at night. Continues on mirtazapine, Zyprexa.   DISCHARGE CONDITIONS:   Fair  CONSULTS OBTAINED:  Treatment Team:  Wyatt Haste, MD  DRUG ALLERGIES:  No Known Allergies  DISCHARGE MEDICATIONS:   Discharge Medication List as of 11/03/2014  1:50 PM    CONTINUE these medications which have NOT CHANGED   Details  aspirin EC 81 MG tablet Take 81 mg by mouth daily., Until Discontinued, Historical Med    gabapentin (NEURONTIN) 100 MG capsule Take 100 mg by mouth 2 (two) times daily. , Until Discontinued, Historical Med    isosorbide mononitrate (IMDUR) 30 MG 24 hr tablet Take 30 mg by mouth daily., Until Discontinued, Historical Med    metoprolol tartrate (LOPRESSOR) 25 MG tablet Take 25 mg by mouth 2 (two) times daily., Until Discontinued, Historical Med    mirtazapine (REMERON) 7.5 MG tablet Take 7.5 mg by mouth at bedtime., Until Discontinued, Historical Med    OLANZapine zydis (ZYPREXA) 5 MG disintegrating tablet Take 5 mg by mouth at bedtime., Until Discontinued, Historical Med  pravastatin (PRAVACHOL) 20 MG tablet Take 20 mg by mouth daily., Until Discontinued, Historical Med      STOP taking these medications     amLODipine (NORVASC) 5 MG tablet      furosemide (LASIX) 40 MG tablet      glipiZIDE (GLUCOTROL XL) 2.5 MG 24 hr tablet      lisinopril  (PRINIVIL,ZESTRIL) 20 MG tablet      metFORMIN (GLUCOPHAGE) 500 MG tablet      potassium chloride (K-DUR) 10 MEQ tablet          DISCHARGE INSTRUCTIONS:    See AVS  Today   CHIEF COMPLAINT:   Chief Complaint  Patient presents with  . Altered Mental Status    HISTORY OF PRESENT ILLNESS:  [name] is a 81 y.o. female with a known history of dementia, essential hypertension, type 2 diabetes uncomplicated, hyperlipidemia unspecified presenting with altered mental status. The patient is unable to provide any meaningful information given mental status/medical condition. History obtained from family member present at bedside. She states that her mother has been increasingly confused more so than baseline as well as drowsy the last 1-2 day duration. She took the patient to physical therapy today where they noted her blood pressure below and prompted them to present to the hospital. Given the above symptoms at present to Hospital further workup and evaluation. Of note she does describe poor by mouth intake for at least one week total duration. On EMS arrival she is noted to be hypoglycemic with glucose of 27. Despite an adequate oral intake she has been continuing to take her medications.  VITAL SIGNS:  Blood pressure 151/72, pulse 88, temperature 97.8 F (36.6 C), temperature source Oral, resp. rate 20, height  (1.6 m), weight 52.345 kg (115 lb 6.4 oz), SpO2 100 %.  I/O:  No intake or output data in the 24 hours ending 11/04/14 1507  PHYSICAL EXAMINATION:  GENERAL:  81 y.o.-year-old patient lying in the bed thin LUNGS: Normal breath sounds bilaterally, no wheezing, rales,rhonchi or crepitation. No use of accessory muscles of respiration.  CARDIOVASCULAR: S1, S2 normal. No murmurs, rubs, or gallops.  ABDOMEN: Soft, non-tender, non-distended. Bowel sounds present. No organomegaly or mass.  EXTREMITIES: No pedal edema, cyanosis, or clubbing.  NEUROLOGIC: Cranial nerves II  through XII are intact. Muscle strength 5/5 in all extremities. Sensation intact. Gait not checked.  PSYCHIATRIC: The patient is alert and oriented to person  SKIN: No obvious rash, lesion, or ulcer.   DATA REVIEW:   CBC  Recent Labs Lab 10/31/14 1744  WBC 8.9  HGB 11.4*  HCT 36.1  PLT 211    Chemistries   Recent Labs Lab 10/31/14 2031  11/03/14 0458  NA 138  < > 142  K 5.5*  < > 4.1  CL 105  < > 117*  CO2 19*  < > 21*  GLUCOSE 219*  < > 102*  BUN 79*  < > 33*  CREATININE 5.09*  < > 1.76*  CALCIUM 9.6  < > 8.5*  AST 90*  --   --   ALT 20  --   --   ALKPHOS 48  --   --   BILITOT 1.6*  --   --   < > = values in this interval not displayed.  Cardiac Enzymes  Recent Labs Lab 10/31/14 1744  TROPONINI 0.04*    Microbiology Results  Results for orders placed or performed during the hospital encounter of 10/31/14  Blood  culture (routine x 2)     Status: None (Preliminary result)   Collection Time: 10/31/14  5:44 PM  Result Value Ref Range Status   Specimen Description BLOOD LEFT ARM  Final   Special Requests BOTTLES DRAWN AEROBIC AND ANAEROBIC 10CC  Final   Culture NO GROWTH 4 DAYS  Final   Report Status PENDING  Incomplete  Blood culture (routine x 2)     Status: None (Preliminary result)   Collection Time: 10/31/14  5:44 PM  Result Value Ref Range Status   Specimen Description BLOOD RIGHT ARM  Final   Special Requests BOTTLES DRAWN AEROBIC AND ANAEROBIC 2CC  Final   Culture NO GROWTH 4 DAYS  Final   Report Status PENDING  Incomplete  C difficile quick scan w PCR reflex     Status: None   Collection Time: 11/01/14  4:49 AM  Result Value Ref Range Status   C Diff antigen NEGATIVE NEGATIVE Final   C Diff toxin NEGATIVE NEGATIVE Final   C Diff interpretation Negative for C. difficile  Final    RADIOLOGY:  No results found.  EKG:   Orders placed or performed during the hospital encounter of 10/31/14  . ED EKG  . ED EKG  . EKG 12-Lead  . EKG 12-Lead       Management plans discussed with the patient, family and they are in agreement.  CODE STATUS: Full  TOTAL TIME TAKING CARE OF THIS PATIENT: 35 minutes.  Greater than 50% of time spent in care coordination and counseling.  Elby Showers M.D on 11/04/2014 at 3:07 PM  Between 7am to 6pm - Pager - [phone]  After 6pm go to [email or web address] - password [email or web address] [facility]  [facility]  Office  [phone]  CC: Primary care physician; Rafael Bihari, MD

## 2014-11-05 ENCOUNTER — Ambulatory Visit: Payer: Medicare Other | Admitting: Physical Therapy

## 2014-11-05 LAB — CULTURE, BLOOD (ROUTINE X 2)
Culture: NO GROWTH
Culture: NO GROWTH

## 2014-11-07 ENCOUNTER — Ambulatory Visit: Payer: Medicare Other | Admitting: Physical Therapy

## 2016-01-01 ENCOUNTER — Emergency Department: Payer: Medicare Other

## 2016-01-01 ENCOUNTER — Encounter: Payer: Self-pay | Admitting: Emergency Medicine

## 2016-01-01 ENCOUNTER — Emergency Department
Admission: EM | Admit: 2016-01-01 | Discharge: 2016-01-01 | Disposition: A | Discharge status: Home / Self Care | Payer: Medicare Other | Attending: Emergency Medicine | Admitting: Emergency Medicine

## 2016-01-01 DIAGNOSIS — E1122 Type 2 diabetes mellitus with diabetic chronic kidney disease: Secondary | ICD-10-CM | POA: Insufficient documentation

## 2016-01-01 DIAGNOSIS — Y939 Activity, unspecified: Secondary | ICD-10-CM | POA: Insufficient documentation

## 2016-01-01 DIAGNOSIS — S0181XA Laceration without foreign body of other part of head, initial encounter: Secondary | ICD-10-CM | POA: Insufficient documentation

## 2016-01-01 DIAGNOSIS — W19XXXA Unspecified fall, initial encounter: Secondary | ICD-10-CM

## 2016-01-01 DIAGNOSIS — Y929 Unspecified place or not applicable: Secondary | ICD-10-CM | POA: Diagnosis not present

## 2016-01-01 DIAGNOSIS — I509 Heart failure, unspecified: Secondary | ICD-10-CM | POA: Diagnosis not present

## 2016-01-01 DIAGNOSIS — F17210 Nicotine dependence, cigarettes, uncomplicated: Secondary | ICD-10-CM | POA: Diagnosis not present

## 2016-01-01 DIAGNOSIS — T148XXA Other injury of unspecified body region, initial encounter: Secondary | ICD-10-CM

## 2016-01-01 DIAGNOSIS — Z79899 Other long term (current) drug therapy: Secondary | ICD-10-CM | POA: Diagnosis not present

## 2016-01-01 DIAGNOSIS — I130 Hypertensive heart and chronic kidney disease with heart failure and stage 1 through stage 4 chronic kidney disease, or unspecified chronic kidney disease: Secondary | ICD-10-CM | POA: Diagnosis not present

## 2016-01-01 DIAGNOSIS — S0990XA Unspecified injury of head, initial encounter: Secondary | ICD-10-CM | POA: Diagnosis present

## 2016-01-01 DIAGNOSIS — S61012A Laceration without foreign body of left thumb without damage to nail, initial encounter: Secondary | ICD-10-CM | POA: Insufficient documentation

## 2016-01-01 DIAGNOSIS — Y999 Unspecified external cause status: Secondary | ICD-10-CM | POA: Insufficient documentation

## 2016-01-01 DIAGNOSIS — N189 Chronic kidney disease, unspecified: Secondary | ICD-10-CM | POA: Insufficient documentation

## 2016-01-01 DIAGNOSIS — W108XXA Fall (on) (from) other stairs and steps, initial encounter: Secondary | ICD-10-CM | POA: Diagnosis not present

## 2016-01-01 DIAGNOSIS — Z7982 Long term (current) use of aspirin: Secondary | ICD-10-CM | POA: Insufficient documentation

## 2016-01-01 NOTE — ED Provider Notes (Signed)
[facility] Emergency Department Provider Note  ____________________________________________   First MD Initiated Contact with Patient 01/01/16 2111     (approximate)  I have reviewed the triage vital signs and the nursing notes.   HISTORY  Chief Complaint Fall   HPI Jamie Glass is a 82 y.o. female with a history of CHF as well as chronic kidney disease name Jamie Glass dysfunction is present to the emergency department after a fall. Per medics, she filled on 5-6 stairs after stumbling. The patient normally walks with a cane. There was no report of the patient passing out. The patient denies any chest pain or shortness of breath. The patient sustained several abrasions and a laceration to the right side of her head. Also with some minor, superficial lacerations to left hand. She is denying any pain to her pelvis. Patient takes aspirin but no other anticoagulants.  Daughters at the bedside and says that the patient had her last tetanus shot within the last year.   Past Medical History:  Diagnosis Date  . Anxiety   . Arthritis   . CHF (congestive heart failure) (HCC)   . Chronic kidney disease   . Diabetes mellitus without complication (HCC)   . Hypertension   . Stress headaches 08/21/2014    Patient Active Problem List   Diagnosis Date Noted  . Pressure ulcer 11/03/2014  . Protein-calorie malnutrition, severe (HCC) 11/02/2014  . Acute renal failure (HCC) 10/31/2014  . Hyperkalemia 10/31/2014    History reviewed. No pertinent surgical history.  Prior to Admission medications   Medication Sig Start Date End Date Taking? Authorizing Provider  aspirin EC 81 MG tablet Take 81 mg by mouth daily.    Historical Provider, MD  gabapentin (NEURONTIN) 100 MG capsule Take 100 mg by mouth 2 (two) times daily.     Historical Provider, MD  isosorbide mononitrate (IMDUR) 30 MG 24 hr tablet Take 30 mg by mouth daily.    Historical Provider, MD  metoprolol  tartrate (LOPRESSOR) 25 MG tablet Take 25 mg by mouth 2 (two) times daily.    Historical Provider, MD  mirtazapine (REMERON) 7.5 MG tablet Take 7.5 mg by mouth at bedtime.    Historical Provider, MD  OLANZapine zydis (ZYPREXA) 5 MG disintegrating tablet Take 5 mg by mouth at bedtime.    Historical Provider, MD  pravastatin (PRAVACHOL) 20 MG tablet Take 20 mg by mouth daily.    Historical Provider, MD    Allergies Review of patient's allergies indicates no known allergies.  Family History  Problem Relation Age of Onset  . Hypertension Other     Social History Social History  Substance Use Topics  . Smoking status: Current Every Day Smoker    Packs/day: 0.50    Years: 84.00    Types: Cigarettes  . Smokeless tobacco: Never Used  . Alcohol use No    Review of Systems Constitutional: No fever/chills Eyes: No visual changes. ENT: No sore throat. Cardiovascular: Denies chest pain. Respiratory: Denies shortness of breath. Gastrointestinal: No abdominal pain.  No nausea, no vomiting.  No diarrhea.  No constipation. Genitourinary: Negative for dysuria. Musculoskeletal: Negative for back pain. Skin: Negative for rash. Neurological: Negative for focal weakness or numbness.  10-point ROS otherwise negative.  ____________________________________________   PHYSICAL EXAM:  VITAL SIGNS: ED Triage Vitals  Enc Vitals Group     BP 01/01/16 2102 (!) 182/77     Pulse Rate 01/01/16 2102 66     Resp 01/01/16 2103 16  Temp 01/01/16 2102 97.7 F (36.5 C)     Temp Source 01/01/16 2102 Oral     SpO2 01/01/16 2102 98 %     Weight --      Height --      Head Circumference --      Peak Flow --      Pain Score 01/01/16 2103 0     Pain Loc --      Pain Edu? --      Excl. in GC? --     Constitutional: Alert and orientedTo self. Well appearing and in no acute distress. Eyes: Conjunctivae are normal. PERRL. EOMI. Head: Right side of the superior forehead with a 2 x 6 cm superficial  abrasion without any active bleeding. Just over the lateral ST of the right eyebrow there is a 2 cm laceration without any active bleeding that is to the subcutaneous tissues and approximates well. Also with a superficial abrasion to the right side of the nasal bridge. No swelling. No tenderness or deformity over the nose. Nose: No congestion/rhinnorhea. Mouth/Throat: Mucous membranes are moist.   Neck: No stridor.  No tenderness to the midline cervical spine. Patient is able to range her head and neck freely without any restriction or signs of pain. Cardiovascular: Normal rate, regular rhythm. Grossly normal heart sounds.   Respiratory: Normal respiratory effort.  No retractions. Lungs CTAB. Gastrointestinal: Soft and nontender. No distention. No CVA tenderness. Musculoskeletal: No lower extremity tenderness nor edema.  No joint effusions.  5 out of 5 strength to bilateral lower extremities. No tenderness to the pelvis. Able to flex at the hips bilaterally without any problems. Also with a superficial laceration to the left MCP joint of the thumb. However, there is no snuffbox tenderness palpation. Neurologic:  Normal speech and language. No gross focal neurologic deficits are appreciated.  Skin:  Skin is warm, dry.  Also with abrasion over the right maxillary arch anteriorly. No depression. No crepitus. No tenderness palpation over this area. Psychiatric: Mood and affect are normal. Speech and behavior are normal.  ____________________________________________   LABS (all labs ordered are listed, but only abnormal results are displayed)  Labs Reviewed - No data to display ____________________________________________  EKG   ____________________________________________  RADIOLOGY  Jamie Glass #[id number] [phone] y.o. F) ED19A    Lab Results   None  Imaging Results     DG Chest 1 View (Final result)  Result time 01/01/16 22:33:01  Final result by Cathie Beams, MD (01/01/16  22:33:01)           Narrative:   CLINICAL DATA: Fall down stairs  EXAM: CHEST 1 VIEW  COMPARISON: Chest radiograph 10/31/2014  FINDINGS: Cardiomediastinal contours are unchanged with mild atherosclerotic calcification within the aortic arch. No pleural effusion or large pneumothorax. No focal airspace consolidation or pulmonary edema.  IMPRESSION: No acute thoracic abnormality.   Electronically Signed By: Deatra Robinson M.D. On: 01/01/2016 22:33            DG Pelvis 1-2 Views (Final result)  Result time 01/01/16 22:31:14  Final result by Mitzi Hansen, MD (01/01/16 22:31:14)           Narrative:   CLINICAL DATA: 82 y/o F; status post fall with pain.  EXAM: PELVIS - 1-2 VIEW  COMPARISON: None.  FINDINGS: There is no evidence of pelvic fracture or diastasis. No pelvic bone lesions are seen. Degenerative changes of the visible lower lumbar spine and sacroiliac joints. Mild osteoarthrosis of the hip  joints with acetabular fibrocystic changes and joint space narrowing. Vascular calcifications.  IMPRESSION: Negative.   Electronically Signed By: Mitzi Hansen M.D. On: 01/01/2016 22:31            CT Head Wo Contrast (Final result)  Result time 01/01/16 21:59:40  Final result by Janice Coffin, MD (01/01/16 21:59:40)           Narrative:   CLINICAL DATA: Pain after trauma with soft tissue swelling over the forehead and right zygoma.  EXAM: CT HEAD WITHOUT CONTRAST  TECHNIQUE: Contiguous axial images were obtained from the base of the skull through the vertex without intravenous contrast.  COMPARISON: 10/31/2014 and CT  FINDINGS: Brain: There is mild superficial and moderate central atrophy with a moderate degree of chronic small vessel ischemic disease of periventricular and subcortical white matter. Bilateral caudate head lacunar infarcts are seen as well as right basal ganglial lacunar infarct.  Right posterior parietal encephalomalacia from chronic infarct. Tiny left cerebellar chronic infarcts. Cisterns are midline. No intra-axial mass or extra-axial fluid is noted. No intracranial hemorrhage or midline shift.  Vascular: Mild bilateral carotid siphon calcifications.  Skull: Is no skull fracture.  Sinuses/Orbits: The paranasal sinuses and mastoids. The orbits appear intact.  Other: Right supraorbital and frontal scalp hematoma.  IMPRESSION: Right supraorbital and frontal scalp hematoma. No acute intracranial abnormality. Chronic moderate small vessel ischemia. Right basal ganglial and bilateral caudate lacunar infarcts. 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 Tiny left cerebellar chronic infarcts.   Electronically Signed By: Tollie Eth M.D. On: 01/01/2016 21:59            ____________________________________________   PROCEDURES  Procedure(s) performed:   LACERATION REPAIR Performed by: Arelia Longest Authorized by: Arelia Longest Consent: Verbal consent obtained. Risks and benefits: risks, benefits and alternatives were discussed Consent given by: patient Patient identity confirmed: provided demographic data Prepped and Draped in normal sterile fashion Wound explored  Laceration Location: Right side of the forehead  Laceration Length: 2cm  No Foreign Bodies seen or palpated  Irrigation method: syringe Amount of cleaning: standard  Skin closure: Dermabond   Technique: Used Steri-Strips over the Dermabond. Good approximation achieved.   Patient tolerance: Patient tolerated the procedure well with no immediate complications.   Procedures  Critical Care performed:   ____________________________________________   INITIAL IMPRESSION / ASSESSMENT AND PLAN / ED COURSE  Pertinent labs & imaging results that were available during my care of the patient were reviewed by me and considered in my medical decision making (see chart for  details).  ----------------------------------------- 11:02 PM on 01/01/2016 -----------------------------------------  Patient with imaging which does not show any serious acute pathology. Patient continues to ask her baseline mental status. Will be discharged to home. To follow-up with her primary care doctor. Reviewed the results with the patient as well as her daughter who is at the bedside.  Clinical Course     ____________________________________________   FINAL CLINICAL IMPRESSION(S) / ED DIAGNOSES  Mechanical fall. Abrasion. Laceration.    NEW MEDICATIONS STARTED DURING THIS VISIT:  New Prescriptions   No medications on file     Note:  This document was prepared using Dragon voice recognition software and may include unintentional dictation errors.    Myrna Blazer, MD 01/01/16 [phone]

## 2016-01-01 NOTE — ED Triage Notes (Signed)
Pt to ED via EMS from home after witnessed mechanical fall. Per EMS pt fell down aprox 5-6 steps, denies any LOC. Pt has lac to rt side forehead , bleeding controlled. Pt is on anticoags. Pt A&O to self and place. VS stable. Pt denies any pain at this time.

## 2016-01-01 NOTE — ED Notes (Signed)
Pt. And family Verbalize understanding of d/c instructions, and follow-up. VS stable and pain controlled per pt.  Pt. In NAD at time of d/c and denies further concerns regarding this visit. Pt. Stable at the time of departure from the unit, departing unit by the safest and most appropriate manner per that pt condition and limitations. Pt advised to return to the ED at any time for emergent concerns, or for new/worsening symptoms.

## 2016-01-08 ENCOUNTER — Encounter: Payer: Self-pay | Admitting: Emergency Medicine

## 2016-01-08 ENCOUNTER — Emergency Department
Admission: EM | Admit: 2016-01-08 | Discharge: 2016-01-09 | Disposition: A | Discharge status: Home / Self Care | Payer: Medicare Other | Attending: Emergency Medicine | Admitting: Emergency Medicine

## 2016-01-08 DIAGNOSIS — R531 Weakness: Secondary | ICD-10-CM | POA: Diagnosis present

## 2016-01-08 DIAGNOSIS — Z79899 Other long term (current) drug therapy: Secondary | ICD-10-CM | POA: Insufficient documentation

## 2016-01-08 DIAGNOSIS — N3000 Acute cystitis without hematuria: Secondary | ICD-10-CM | POA: Diagnosis not present

## 2016-01-08 DIAGNOSIS — F0390 Unspecified dementia without behavioral disturbance: Secondary | ICD-10-CM | POA: Insufficient documentation

## 2016-01-08 DIAGNOSIS — F17210 Nicotine dependence, cigarettes, uncomplicated: Secondary | ICD-10-CM | POA: Diagnosis not present

## 2016-01-08 DIAGNOSIS — N189 Chronic kidney disease, unspecified: Secondary | ICD-10-CM | POA: Diagnosis not present

## 2016-01-08 DIAGNOSIS — I130 Hypertensive heart and chronic kidney disease with heart failure and stage 1 through stage 4 chronic kidney disease, or unspecified chronic kidney disease: Secondary | ICD-10-CM | POA: Diagnosis not present

## 2016-01-08 DIAGNOSIS — E1122 Type 2 diabetes mellitus with diabetic chronic kidney disease: Secondary | ICD-10-CM | POA: Diagnosis not present

## 2016-01-08 DIAGNOSIS — Z7982 Long term (current) use of aspirin: Secondary | ICD-10-CM | POA: Diagnosis not present

## 2016-01-08 DIAGNOSIS — I509 Heart failure, unspecified: Secondary | ICD-10-CM | POA: Insufficient documentation

## 2016-01-08 DIAGNOSIS — Z7984 Long term (current) use of oral hypoglycemic drugs: Secondary | ICD-10-CM | POA: Diagnosis not present

## 2016-01-08 LAB — BASIC METABOLIC PANEL
ANION GAP: 4 — AB (ref 5–15)
BUN: 17 mg/dL (ref 6–20)
CHLORIDE: 121 mmol/L — AB (ref 101–111)
CO2: 20 mmol/L — AB (ref 22–32)
Calcium: 6.5 mg/dL — ABNORMAL LOW (ref 8.9–10.3)
Creatinine, Ser: 0.94 mg/dL (ref 0.44–1.00)
GFR calc Af Amer: >60 mL/min (ref >60)
GFR, EST NON AFRICAN AMERICAN: 53 mL/min — AB (ref >60)
GLUCOSE: 83 mg/dL (ref 65–99)
POTASSIUM: 3.1 mmol/L — AB (ref 3.5–5.1)
Sodium: 145 mmol/L (ref 135–145)

## 2016-01-08 LAB — CBC
HEMATOCRIT: 33.5 % — AB (ref 35.0–47.0)
HEMOGLOBIN: 10.8 g/dL — AB (ref 12.0–16.0)
MCH: 25.7 pg — ABNORMAL LOW (ref 26.0–34.0)
MCHC: 32.3 g/dL (ref 32.0–36.0)
MCV: 79.5 fL — AB (ref 80.0–100.0)
Platelets: 174 10*3/uL (ref 150–440)
RBC: 4.22 MIL/uL (ref 3.80–5.20)
RDW: 16.9 % — ABNORMAL HIGH (ref 11.5–14.5)
WBC: 3.9 10*3/uL (ref 3.6–11.0)

## 2016-01-08 LAB — URINALYSIS COMPLETE WITH MICROSCOPIC (ARMC ONLY)
Bilirubin Urine: NEGATIVE
Glucose, UA: NEGATIVE mg/dL
Ketones, ur: NEGATIVE mg/dL
NITRITE: NEGATIVE
PROTEIN: 30 mg/dL — AB
SPECIFIC GRAVITY, URINE: 1.012 (ref 1.005–1.030)
pH: 6 (ref 5.0–8.0)

## 2016-01-08 LAB — GLUCOSE, CAPILLARY: GLUCOSE-CAPILLARY: 91 mg/dL (ref 65–99)

## 2016-01-08 MED ORDER — DEXTROSE 5 % IV SOLN: 1.0000 g | Freq: Once | INTRAVENOUS | Status: DC

## 2016-01-08 MED ORDER — SODIUM CHLORIDE 0.9 % IV BOLUS (SEPSIS)
1000.0000 mL | Freq: Once | INTRAVENOUS | Status: AC
  Administered 2016-01-09: 1000 mL via INTRAVENOUS

## 2016-01-09 ENCOUNTER — Emergency Department: Payer: Medicare Other

## 2016-01-09 MED ORDER — CEFTRIAXONE SODIUM-DEXTROSE 1-3.74 GM-% IV SOLR: 1.0000 g | Freq: Once | INTRAVENOUS | Status: DC

## 2016-01-09 MED ORDER — CEFTRIAXONE SODIUM-DEXTROSE 1-3.74 GM-% IV SOLR
INTRAVENOUS | Status: AC
  Administered 2016-01-09: 1000 mg
  Filled 2016-01-09: qty 50

## 2016-01-09 MED ORDER — CEPHALEXIN 500 MG PO CAPS: 500.0000 mg | ORAL_CAPSULE | Freq: Two times a day (BID) | ORAL | 0 refills | Status: AC

## 2016-01-09 NOTE — ED Notes (Signed)
Patient transported to CT 

## 2016-01-09 NOTE — ED Provider Notes (Signed)
[facility] Emergency Department Provider Note    First MD Initiated Contact with Patient 01/08/16 2305     (approximate)  I have reviewed the triage vital signs and the nursing notes.   HISTORY  Chief Complaint Weakness    HPI Jamie Glass is a 82 y.o. female with history of CHF chronic kidney disease diabetes, hypertension and dementia presents to the emergency department with acute onset of generalized weakness. Per the patient's daughter after sitting on the porch for a while the patient wanted to return into the house to watch television and while in route told her daughter that she could not go any further and stopped. No fall no loss of consciousness. Patient denies any pain, no nausea no vomiting no diarrhea.    Past Medical History:  Diagnosis Date  . Anxiety   . Arthritis   . CHF (congestive heart failure) (HCC)   . Chronic kidney disease   . Diabetes mellitus without complication (HCC)   . Hypertension   . Stress headaches 08/21/2014    Patient Active Problem List   Diagnosis Date Noted  . Pressure ulcer 11/03/2014  . Protein-calorie malnutrition, severe (HCC) 11/02/2014  . Acute renal failure (HCC) 10/31/2014  . Hyperkalemia 10/31/2014    History reviewed. No pertinent surgical history.  Prior to Admission medications   Medication Sig Start Date End Date Taking? Authorizing Provider  amLODipine (NORVASC) 5 MG tablet Take 5 mg by mouth daily.   Yes Historical Provider, MD  aspirin EC 81 MG tablet Take 81 mg by mouth daily.   Yes Historical Provider, MD  furosemide (LASIX) 20 MG tablet Take 20 mg by mouth daily.   Yes Historical Provider, MD  gabapentin (NEURONTIN) 100 MG capsule Take 100 mg by mouth 2 (two) times daily.    Yes Historical Provider, MD  glipiZIDE (GLUCOTROL XL) 2.5 MG 24 hr tablet Take 2.5 mg by mouth daily with breakfast.   Yes Historical Provider, MD  isosorbide mononitrate (IMDUR) 30 MG 24 hr tablet Take 30 mg by  mouth daily.   Yes Historical Provider, MD  lisinopril (PRINIVIL,ZESTRIL) 20 MG tablet Take 20 mg by mouth 2 (two) times daily.   Yes Historical Provider, MD  metFORMIN (GLUCOPHAGE) 500 MG tablet Take by mouth 2 (two) times daily with a meal.   Yes Historical Provider, MD  metoprolol tartrate (LOPRESSOR) 25 MG tablet Take 25 mg by mouth 2 (two) times daily.   Yes Historical Provider, MD  mirtazapine (REMERON) 7.5 MG tablet Take 7.5 mg by mouth at bedtime.   Yes Historical Provider, MD  OLANZapine zydis (ZYPREXA) 5 MG disintegrating tablet Take 5 mg by mouth at bedtime.   Yes Historical Provider, MD  potassium chloride (K-DUR) 10 MEQ tablet Take 10 mEq by mouth 2 (two) times daily.   Yes Historical Provider, MD  pravastatin (PRAVACHOL) 20 MG tablet Take 20 mg by mouth daily.   Yes Historical Provider, MD    Allergies No known drug allergies  Family History  Problem Relation Age of Onset  . Hypertension Other     Social History Social History  Substance Use Topics  . Smoking status: Current Every Day Smoker    Packs/day: 0.50    Years: 84.00    Types: Cigarettes  . Smokeless tobacco: Never Used  . Alcohol use No    Review of Systems Constitutional: No fever/chills Eyes: No visual changes. ENT: No sore throat. Cardiovascular: Denies chest pain. Respiratory: Denies shortness of breath. Gastrointestinal:  No abdominal pain.  No nausea, no vomiting.  No diarrhea.  No constipation. Genitourinary: Negative for dysuria. Musculoskeletal: Negative for back pain. Skin: Negative for rash. Neurological: Negative for headaches, focal weakness or numbness.Positive for generalized weakness  10-point ROS otherwise negative.  ____________________________________________   PHYSICAL EXAM:  VITAL SIGNS: ED Triage Vitals [01/08/16 1958]  Enc Vitals Group     BP (!) 186/86     Pulse Rate 97     Resp 19     Temp 98 F (36.7 C)     Temp Source Oral     SpO2 98 %     Weight      Height       Head Circumference      Peak Flow      Pain Score      Pain Loc      Pain Edu?      Excl. in GC?     Constitutional: Alert and oriented. Well appearing and in no acute distress. Eyes: Conjunctivae are normal. PERRL. EOMI. Head: Atraumatic. Ears:  Healthy appearing ear canals and TMs bilaterally Nose: No congestion/rhinnorhea. Mouth/Throat: Mucous membranes are moist.  Oropharynx non-erythematous. Neck: No stridor.  No meningeal signs.  No cervical spine tenderness to palpation. Cardiovascular: Normal rate, regular rhythm. Good peripheral circulation. Grossly normal heart sounds. Respiratory: Normal respiratory effort.  No retractions. Lungs CTAB. Gastrointestinal: Soft and nontender. No distention.   Musculoskeletal: No lower extremity tenderness nor edema. No gross deformities of extremities. Neurologic:  Normal speech and language. No gross focal neurologic deficits are appreciated.  Skin:  Skin is warm, dry and intact. No rash noted. Psychiatric: Mood and affect are normal. Speech and behavior are normal.  ____________________________________________   LABS (all labs ordered are listed, but only abnormal results are displayed)  Labs Reviewed  BASIC METABOLIC PANEL - Abnormal; Notable for the following:       Result Value   Potassium 3.1 (*)    Chloride 121 (*)    CO2 20 (*)    Calcium 6.5 (*)    GFR calc non Af Amer 53 (*)    Anion gap 4 (*)    All other components within normal limits  CBC - Abnormal; Notable for the following:    Hemoglobin 10.8 (*)    HCT 33.5 (*)    MCV 79.5 (*)    MCH 25.7 (*)    RDW 16.9 (*)    All other components within normal limits  URINALYSIS COMPLETEWITH MICROSCOPIC ([facility] ONLY) - Abnormal; Notable for the following:    Color, Urine YELLOW (*)    APPearance HAZY (*)    Hgb urine dipstick 1+ (*)    Protein, ur 30 (*)    Leukocytes, UA 1+ (*)    Bacteria, UA MANY (*)    Squamous Epithelial / LPF 0-5 (*)    All other components  within normal limits  GLUCOSE, CAPILLARY  CBG MONITORING, ED   ____________________________________________  EKG  ED ECG REPORT I, [place] N Reese Senk, the attending physician, personally viewed and interpreted this ECG.   Date: 01/09/2016  EKG Time: 8:00PM  Rate: 75  Rhythm: Normal sinus rhythm  Axis: Normal  Intervals: Normal  ST&T Change: None  ____________________________________________  RADIOLOGY I, [place] N Allin Frix, personally viewed and evaluated these images (plain radiographs) as part of my medical decision making, as well as reviewing the written report by the radiologist.  Ct Head Wo Contrast  Result Date: 01/09/2016 CLINICAL DATA:  82  y/o F; recent fall with right facial injury, left facial numbness, and altered mental status. EXAM: CT HEAD WITHOUT CONTRAST TECHNIQUE: Contiguous axial images were obtained from the base of the skull through the vertex without intravenous contrast. COMPARISON:  01/01/2016 CT head. FINDINGS: Brain: No evidence of acute infarction, hemorrhage, hydrocephalus, extra-axial collection or mass lesion/mass effect. Stable small chronic infarcts in right superolateral frontal lobe, right parietal lobe, right putamen, bilateral caudate head and left cerebellar hemisphere. Stable advanced chronic microvascular ischemic changes. Stable mild parenchymal volume loss. Vascular: No hyperdense vessel. Calcific atherosclerosis of vertebral and cavernous internal carotid arteries. Skull: Diminished size to right frontal scalp hematoma. No displaced calvarial fracture. Sinuses/Orbits: Visualized paranasal sinuses and mastoid air cells are normally aerated. Bilateral intra-ocular lens replacement. Other: None. IMPRESSION: 1. No acute intracranial abnormality is identified. 2. Diminished right frontal scalp hematoma. No displaced calvarial fracture. 3. Stable advanced chronic microvascular ischemic changes, several small chronic cortical, basal ganglia, cerebellar  infarcts, and parenchymal volume loss. Electronically Signed   By: Mitzi HansenLance  Furusawa-Stratton M.D.   On: 01/09/2016 00:28    ____________________________________________   Procedures     INITIAL IMPRESSION / ASSESSMENT AND PLAN / ED COURSE  Pertinent labs & imaging results that were available during my care of the patient were reviewed by me and considered in my medical decision making (see chart for details).  Patient given IV ceftriaxone 1 g for urinalysis consistent with urinary tract infection will be prescribed Keflex for home.   Clinical Course    ____________________________________________  FINAL CLINICAL IMPRESSION(S) / ED DIAGNOSES  Final diagnoses:  Acute cystitis without hematuria     MEDICATIONS GIVEN DURING THIS VISIT:  Medications  cefTRIAXone (ROCEPHIN) IVPB 1 g (not administered)  sodium chloride 0.9 % bolus 1,000 mL (0 mLs Intravenous Stopped 01/09/16 0043)  cefTRIAXone (ROCEPHIN) 1-3.74 GM-% IVPB (1,000 mg  Given 01/09/16 0008)     NEW OUTPATIENT MEDICATIONS STARTED DURING THIS VISIT:  New Prescriptions   No medications on file    Modified Medications   No medications on file    Discontinued Medications   No medications on file     Note:  This document was prepared using Dragon voice recognition software and may include unintentional dictation errors.    Darci Currentandolph N Kela Baccari, MD 01/09/16 [phone]

## 2016-01-09 NOTE — ED Notes (Signed)
Patient placed on bedpan.

## 2016-02-25 ENCOUNTER — Inpatient Hospital Stay
Admission: EM | Admit: 2016-02-25 | Discharge: 2016-02-28 | DRG: 871 | Disposition: A | Discharge status: Skilled Nursing Facility | Payer: Medicare Other | Attending: Internal Medicine | Admitting: Internal Medicine

## 2016-02-25 ENCOUNTER — Emergency Department: Payer: Medicare Other

## 2016-02-25 DIAGNOSIS — I5023 Acute on chronic systolic (congestive) heart failure: Secondary | ICD-10-CM | POA: Diagnosis present

## 2016-02-25 DIAGNOSIS — F0390 Unspecified dementia without behavioral disturbance: Secondary | ICD-10-CM | POA: Diagnosis present

## 2016-02-25 DIAGNOSIS — N390 Urinary tract infection, site not specified: Secondary | ICD-10-CM | POA: Diagnosis present

## 2016-02-25 DIAGNOSIS — E785 Hyperlipidemia, unspecified: Secondary | ICD-10-CM | POA: Diagnosis present

## 2016-02-25 DIAGNOSIS — I248 Other forms of acute ischemic heart disease: Secondary | ICD-10-CM | POA: Diagnosis present

## 2016-02-25 DIAGNOSIS — R296 Repeated falls: Secondary | ICD-10-CM

## 2016-02-25 DIAGNOSIS — R778 Other specified abnormalities of plasma proteins: Secondary | ICD-10-CM | POA: Diagnosis present

## 2016-02-25 DIAGNOSIS — E1122 Type 2 diabetes mellitus with diabetic chronic kidney disease: Secondary | ICD-10-CM | POA: Diagnosis present

## 2016-02-25 DIAGNOSIS — N179 Acute kidney failure, unspecified: Secondary | ICD-10-CM | POA: Diagnosis present

## 2016-02-25 DIAGNOSIS — A4151 Sepsis due to Escherichia coli [E. coli]: Secondary | ICD-10-CM | POA: Diagnosis not present

## 2016-02-25 DIAGNOSIS — Z7982 Long term (current) use of aspirin: Secondary | ICD-10-CM | POA: Diagnosis not present

## 2016-02-25 DIAGNOSIS — E860 Dehydration: Secondary | ICD-10-CM | POA: Diagnosis present

## 2016-02-25 DIAGNOSIS — N183 Chronic kidney disease, stage 3 (moderate): Secondary | ICD-10-CM | POA: Diagnosis present

## 2016-02-25 DIAGNOSIS — T68XXXA Hypothermia, initial encounter: Secondary | ICD-10-CM | POA: Diagnosis present

## 2016-02-25 DIAGNOSIS — M6282 Rhabdomyolysis: Secondary | ICD-10-CM | POA: Diagnosis present

## 2016-02-25 DIAGNOSIS — Z79899 Other long term (current) drug therapy: Secondary | ICD-10-CM

## 2016-02-25 DIAGNOSIS — M6281 Muscle weakness (generalized): Secondary | ICD-10-CM

## 2016-02-25 DIAGNOSIS — R262 Difficulty in walking, not elsewhere classified: Secondary | ICD-10-CM

## 2016-02-25 DIAGNOSIS — E872 Acidosis, unspecified: Secondary | ICD-10-CM

## 2016-02-25 DIAGNOSIS — I130 Hypertensive heart and chronic kidney disease with heart failure and stage 1 through stage 4 chronic kidney disease, or unspecified chronic kidney disease: Secondary | ICD-10-CM | POA: Diagnosis present

## 2016-02-25 DIAGNOSIS — Z8249 Family history of ischemic heart disease and other diseases of the circulatory system: Secondary | ICD-10-CM

## 2016-02-25 DIAGNOSIS — F17210 Nicotine dependence, cigarettes, uncomplicated: Secondary | ICD-10-CM | POA: Diagnosis present

## 2016-02-25 DIAGNOSIS — A419 Sepsis, unspecified organism: Secondary | ICD-10-CM | POA: Diagnosis present

## 2016-02-25 DIAGNOSIS — R7989 Other specified abnormal findings of blood chemistry: Secondary | ICD-10-CM

## 2016-02-25 DIAGNOSIS — R64 Cachexia: Secondary | ICD-10-CM | POA: Diagnosis present

## 2016-02-25 DIAGNOSIS — L8990 Pressure ulcer of unspecified site, unspecified stage: Secondary | ICD-10-CM | POA: Insufficient documentation

## 2016-02-25 HISTORY — DX: Unspecified dementia, unspecified severity, without behavioral disturbance, psychotic disturbance, mood disturbance, and anxiety: F03.90

## 2016-02-25 LAB — GLUCOSE, CAPILLARY: Glucose-Capillary: 131 mg/dL — ABNORMAL HIGH (ref 65–99)

## 2016-02-25 LAB — COOXEMETRY PANEL
CARBOXYHEMOGLOBIN: 2.3 % — AB (ref 0.5–1.5)
METHEMOGLOBIN: 1.5 % (ref 0.0–1.5)
O2 Saturation: 91.2 %
TOTAL OXYGEN CONTENT: 89.5 mL/dL
Total hemoglobin: 14.8 g/dL (ref 12.0–16.0)

## 2016-02-25 LAB — CBC WITH DIFFERENTIAL/PLATELET
BASOS ABS: 0 10*3/uL (ref 0–0.1)
BASOS PCT: 0 %
EOS ABS: 0 10*3/uL (ref 0–0.7)
EOS PCT: 0 %
HEMATOCRIT: 44.5 % (ref 35.0–47.0)
Hemoglobin: 14.4 g/dL (ref 12.0–16.0)
Lymphocytes Relative: 10 %
Lymphs Abs: 0.6 10*3/uL — ABNORMAL LOW (ref 1.0–3.6)
MCH: 25.9 pg — ABNORMAL LOW (ref 26.0–34.0)
MCHC: 32.4 g/dL (ref 32.0–36.0)
MCV: 80.1 fL (ref 80.0–100.0)
MONO ABS: 0.3 10*3/uL (ref 0.2–0.9)
Monocytes Relative: 5 %
NEUTROS ABS: 4.8 10*3/uL (ref 1.4–6.5)
Neutrophils Relative %: 85 %
PLATELETS: 218 10*3/uL (ref 150–440)
RBC: 5.56 MIL/uL — ABNORMAL HIGH (ref 3.80–5.20)
RDW: 16.2 % — AB (ref 11.5–14.5)
WBC: 5.8 10*3/uL (ref 3.6–11.0)

## 2016-02-25 LAB — COMPREHENSIVE METABOLIC PANEL
ALBUMIN: 4.2 g/dL (ref 3.5–5.0)
ALK PHOS: 82 U/L (ref 38–126)
ALT: 16 U/L (ref 14–54)
ANION GAP: 13 (ref 5–15)
AST: 52 U/L — ABNORMAL HIGH (ref 15–41)
BILIRUBIN TOTAL: 1.1 mg/dL (ref 0.3–1.2)
BUN: 17 mg/dL (ref 6–20)
CALCIUM: 9.9 mg/dL (ref 8.9–10.3)
CO2: 24 mmol/L (ref 22–32)
CREATININE: 1.28 mg/dL — AB (ref 0.44–1.00)
Chloride: 101 mmol/L (ref 101–111)
GFR calc non Af Amer: 37 mL/min — ABNORMAL LOW (ref >60)
GFR, EST AFRICAN AMERICAN: 43 mL/min — AB (ref >60)
GLUCOSE: 149 mg/dL — AB (ref 65–99)
Potassium: 4 mmol/L (ref 3.5–5.1)
SODIUM: 138 mmol/L (ref 135–145)
TOTAL PROTEIN: 8.7 g/dL — AB (ref 6.5–8.1)

## 2016-02-25 LAB — LACTIC ACID, PLASMA
LACTIC ACID, VENOUS: 3 mmol/L — AB (ref 0.5–1.9)
LACTIC ACID, VENOUS: 1.5 mmol/L (ref 0.5–1.9)

## 2016-02-25 LAB — URINALYSIS, COMPLETE (UACMP) WITH MICROSCOPIC
Bilirubin Urine: NEGATIVE
GLUCOSE, UA: NEGATIVE mg/dL
Ketones, ur: 5 mg/dL — AB
NITRITE: POSITIVE — AB
PH: 5 (ref 5.0–8.0)
Protein, ur: >=300 mg/dL — AB
Specific Gravity, Urine: 1.012 (ref 1.005–1.030)

## 2016-02-25 LAB — LIPASE, BLOOD

## 2016-02-25 LAB — TROPONIN I
TROPONIN I: 0.44 ng/mL — AB (ref <0.03)
Troponin I: 0.49 ng/mL (ref <0.03)
Troponin I: 0.27 ng/mL (ref <0.03)

## 2016-02-25 LAB — ACETAMINOPHEN LEVEL: Acetaminophen (Tylenol), Serum: <10 ug/mL — ABNORMAL LOW (ref 10–30)

## 2016-02-25 MED ORDER — SENNOSIDES-DOCUSATE SODIUM 8.6-50 MG PO TABS: 1.0000 | ORAL_TABLET | Freq: Every evening | ORAL | Status: DC | PRN

## 2016-02-25 MED ORDER — ASPIRIN EC 81 MG PO TBEC
81.0000 mg | DELAYED_RELEASE_TABLET | Freq: Every day | ORAL | Status: DC
  Administered 2016-02-25 – 2016-02-28 (×4): 81 mg via ORAL
  Filled 2016-02-25 (×4): qty 1

## 2016-02-25 MED ORDER — METOPROLOL TARTRATE 25 MG PO TABS
25.0000 mg | ORAL_TABLET | Freq: Two times a day (BID) | ORAL | Status: DC
  Administered 2016-02-25 – 2016-02-28 (×6): 25 mg via ORAL
  Filled 2016-02-25 (×6): qty 1

## 2016-02-25 MED ORDER — ISOSORBIDE MONONITRATE ER 60 MG PO TB24
30.0000 mg | ORAL_TABLET | Freq: Every day | ORAL | Status: DC
  Administered 2016-02-25 – 2016-02-28 (×4): 30 mg via ORAL
  Filled 2016-02-25 (×4): qty 1

## 2016-02-25 MED ORDER — OLANZAPINE 5 MG PO TBDP
5.0000 mg | ORAL_TABLET | Freq: Every day | ORAL | Status: DC
  Administered 2016-02-25 – 2016-02-27 (×3): 5 mg via ORAL
  Filled 2016-02-25 (×3): qty 1

## 2016-02-25 MED ORDER — ENOXAPARIN SODIUM 30 MG/0.3ML ~~LOC~~ SOLN
30.0000 mg | SUBCUTANEOUS | Status: DC
Start: 2016-02-25 — End: 2016-02-28
  Administered 2016-02-25 – 2016-02-27 (×3): 30 mg via SUBCUTANEOUS
  Filled 2016-02-25 (×3): qty 0.3

## 2016-02-25 MED ORDER — DEXTROSE 5 % IV SOLN
500.0000 mg | Freq: Once | INTRAVENOUS | Status: AC
  Administered 2016-02-25: 500 mg via INTRAVENOUS
  Filled 2016-02-25: qty 500

## 2016-02-25 MED ORDER — PRAVASTATIN SODIUM 20 MG PO TABS
20.0000 mg | ORAL_TABLET | Freq: Every day | ORAL | Status: DC
  Administered 2016-02-25 – 2016-02-28 (×4): 20 mg via ORAL
  Filled 2016-02-25 (×4): qty 1

## 2016-02-25 MED ORDER — CEFTRIAXONE SODIUM-DEXTROSE 1-3.74 GM-% IV SOLR
1.0000 g | Freq: Once | INTRAVENOUS | Status: AC
  Administered 2016-02-25: 19:00:00 1 g via INTRAVENOUS
  Filled 2016-02-25: qty 50

## 2016-02-25 MED ORDER — ONDANSETRON HCL 4 MG PO TABS
4.0000 mg | ORAL_TABLET | Freq: Four times a day (QID) | ORAL | Status: DC | PRN
Start: 2016-02-25 — End: 2016-02-28

## 2016-02-25 MED ORDER — CEFTRIAXONE SODIUM-DEXTROSE 1-3.74 GM-% IV SOLR
1.0000 g | INTRAVENOUS | Status: DC
  Administered 2016-02-26 – 2016-02-27 (×2): 1 g via INTRAVENOUS
  Filled 2016-02-25 (×2): qty 50

## 2016-02-25 MED ORDER — GABAPENTIN 100 MG PO CAPS
100.0000 mg | ORAL_CAPSULE | Freq: Two times a day (BID) | ORAL | Status: DC
  Administered 2016-02-25 – 2016-02-28 (×6): 100 mg via ORAL
  Filled 2016-02-25 (×6): qty 1

## 2016-02-25 MED ORDER — ONDANSETRON HCL 4 MG/2ML IJ SOLN: 4.0000 mg | Freq: Four times a day (QID) | INTRAMUSCULAR | Status: DC | PRN

## 2016-02-25 MED ORDER — HYDRALAZINE HCL 20 MG/ML IJ SOLN
10.0000 mg | Freq: Four times a day (QID) | INTRAMUSCULAR | Status: DC | PRN
  Administered 2016-02-25: 17:00:00 via INTRAVENOUS
  Filled 2016-02-25: qty 1

## 2016-02-25 MED ORDER — DEXTROSE 5 % IV SOLN: 1.0000 g | Freq: Once | INTRAVENOUS | Status: DC

## 2016-02-25 MED ORDER — SODIUM CHLORIDE 0.9% FLUSH
3.0000 mL | Freq: Two times a day (BID) | INTRAVENOUS | Status: DC
  Administered 2016-02-25 – 2016-02-28 (×6): 3 mL via INTRAVENOUS

## 2016-02-25 MED ORDER — ENOXAPARIN SODIUM 40 MG/0.4ML ~~LOC~~ SOLN: 40.0000 mg | SUBCUTANEOUS | Status: DC

## 2016-02-25 MED ORDER — ACETAMINOPHEN 650 MG RE SUPP: 650.0000 mg | Freq: Four times a day (QID) | RECTAL | Status: DC | PRN

## 2016-02-25 MED ORDER — SODIUM CHLORIDE 0.9 % IV SOLN
Freq: Once | INTRAVENOUS | Status: AC
  Administered 2016-02-25: 15:00:00 via INTRAVENOUS

## 2016-02-25 MED ORDER — ACETAMINOPHEN 325 MG PO TABS
650.0000 mg | ORAL_TABLET | Freq: Four times a day (QID) | ORAL | Status: DC | PRN
  Administered 2016-02-28: 650 mg via ORAL
  Filled 2016-02-25: qty 2

## 2016-02-25 MED ORDER — AMLODIPINE BESYLATE 5 MG PO TABS
5.0000 mg | ORAL_TABLET | Freq: Every day | ORAL | Status: DC
  Administered 2016-02-25 – 2016-02-26 (×2): 5 mg via ORAL
  Filled 2016-02-25 (×3): qty 1

## 2016-02-25 MED ORDER — SODIUM CHLORIDE 0.9 % IV SOLN
INTRAVENOUS | Status: DC
  Administered 2016-02-25 – 2016-02-27 (×5): via INTRAVENOUS

## 2016-02-25 MED ORDER — HYDROCODONE-ACETAMINOPHEN 5-325 MG PO TABS: 1.0000 | ORAL_TABLET | ORAL | Status: DC | PRN

## 2016-02-25 MED ORDER — CEFTRIAXONE SODIUM-DEXTROSE 1-3.74 GM-% IV SOLR
1.0000 g | Freq: Once | INTRAVENOUS | Status: DC
  Filled 2016-02-25: qty 50

## 2016-02-25 NOTE — Progress Notes (Signed)
Pharmacy Antibiotic Note  Jamie FilesBernice Glass is a 82 y.o. female admitted on 02/25/2016 with UTI.  Pharmacy has been consulted for ceftriaxone dosing.  Plan: Ceftriaxone 1 g IV dose ordered today while in ED. Will continue ceftriaxone 1 g IV daily beginning tomorrow.  Weight: 135 lb 6.4 oz (61.4 kg)  Temp (24hrs), Avg:95.6 F (35.3 C), Min:93.9 F (34.4 C), Max:97.2 F (36.2 C)   Recent Labs Lab 02/25/16 1227  WBC 5.8  CREATININE 1.28*  LATICACIDVEN 3.0*    CrCl cannot be calculated (Unknown ideal weight.).    No Known Allergies  Antimicrobials this admission: ceftriaxone 12/20 >>  azithromycin 12/20 in ED  Dose adjustments this admission:  Microbiology results: 12/20 BCx: Sent 12/20 UCx: Sent   Thank you for allowing pharmacy to be a part of this patient's care.  Jamie Glass, PharmD, BCPS Clinical Pharmacist 02/25/2016 3:02 PM

## 2016-02-25 NOTE — Progress Notes (Signed)
Pt arrived to floor from ED with foley in place.

## 2016-02-25 NOTE — ED Notes (Signed)
Pt taken to CT via stretcher.

## 2016-02-25 NOTE — ED Provider Notes (Signed)
[facility] Emergency Department Provider Note   ____________________________________________   First MD Initiated Contact with Patient 02/25/16 1216     (approximate)  I have reviewed the triage vital signs and the nursing notes.   HISTORY  Chief Complaint Urinary Tract Infection and Code Sepsis  Chief complaint is found with altered mental status  HPI Jamie Glass is a 82 y.o. female who has a history of dementia who was found laying in bed with minimal responsiveness. The DSS worker had come by the house to give out apparently Christmas presents and found the daughter down on the floor. Apparently the daughter had gone outside fall and hit her head made it back inside and apparently passed out. This is the history that EMS provided to the nurse who provided to me. [facility]. This patient has a strong smell of urine says she feels okay but is laying in bed staring up into the left.   Past Medical History:  Diagnosis Date  . Anxiety   . Arthritis   . CHF (congestive heart failure) (HCC)   . Chronic kidney disease   . Dementia   . Diabetes mellitus without complication (HCC)   . Hypertension   . Stress headaches 08/21/2014    Patient Active Problem List   Diagnosis Date Noted  . Pressure ulcer 11/03/2014  . Protein-calorie malnutrition, severe (HCC) 11/02/2014  . Acute renal failure (HCC) 10/31/2014  . Hyperkalemia 10/31/2014    History reviewed. No pertinent surgical history.  Prior to Admission medications   Medication Sig Start Date End Date Taking? Authorizing Provider  amLODipine (NORVASC) 5 MG tablet Take 5 mg by mouth daily.    Historical Provider, MD  aspirin EC 81 MG tablet Take 81 mg by mouth daily.    Historical Provider, MD  furosemide (LASIX) 20 MG tablet Take 20 mg by mouth daily.    Historical Provider, MD  gabapentin (NEURONTIN) 100 MG capsule Take 100 mg by mouth 2 (two) times daily.      Historical Provider, MD  isosorbide mononitrate (IMDUR) 30 MG 24 hr tablet Take 30 mg by mouth daily.    Historical Provider, MD  metoprolol tartrate (LOPRESSOR) 25 MG tablet Take 25 mg by mouth 2 (two) times daily.    Historical Provider, MD  OLANZapine zydis (ZYPREXA) 5 MG disintegrating tablet Take 5 mg by mouth at bedtime.    Historical Provider, MD  pravastatin (PRAVACHOL) 20 MG tablet Take 20 mg by mouth daily.    Historical Provider, MD    Allergies Patient has no known allergies.  Family History  Problem Relation Age of Onset  . Hypertension Other     Social History Social History  Substance Use Topics  . Smoking status: Current Every Day Smoker    Packs/day: 0.50    Years: 84.00    Types: Cigarettes  . Smokeless tobacco: Never Used  . Alcohol use No    Review of Systems Constitutional: No fever/chills Eyes: No visual changes. ENT: No sore throat. Cardiovascular: Denies chest pain. Respiratory: Denies shortness of breath. Gastrointestinal: No abdominal pain.  No nausea, no vomiting.  No diarrhea.  No constipation. Genitourinary: Negative for dysuria. Musculoskeletal: Negative for back pain. Skin: Negative for rash. Neurological: Negative for headaches, focal weakness or numbness.  10-point ROS otherwise negative.  ____________________________________________   PHYSICAL EXAM:  VITAL SIGNS: ED Triage Vitals  Enc Vitals Group     BP  Pulse      Resp      Temp      Temp src      SpO2      Weight      Height      Head Circumference      Peak Flow      Pain Score      Pain Loc      Pain Edu?      Excl. in GC?     Constitutional: Awake as she feels okay Eyes: Conjunctivae are normal. PERRL. she is looking up into the left I'm not able to get her to look in any other direction at the present time Head: Atraumatic. Nose: No congestion/rhinnorhea. Mouth/Throat: Mucous membranes are moist.  Oropharynx non-erythematous. Neck: No stridor.     Cardiovascular: Normal rate, regular rhythm. Grossly normal heart sounds.  Good peripheral circulation. Respiratory: Normal respiratory effort.  No retractions. Lungs CTAB. Gastrointestinal: Soft and nontender. No distention. No abdominal bruits. No CVA tenderness. }Musculoskeletal: No lower extremity tenderness nor edema.  No joint effusions. Neurologic:  Normal speech and language. Patient's really not following commands well enough for me to test her strength Skin:  Skin is warm, dry and intact. Patient has small scabs on both heels are not sure if this is the beginning of decubitus.   ____________________________________________   LABS (all labs ordered are listed, but only abnormal results are displayed)  Labs Reviewed  COMPREHENSIVE METABOLIC PANEL - Abnormal; Notable for the following:       Result Value   Glucose, Bld 149 (*)    Creatinine, Ser 1.28 (*)    Total Protein 8.7 (*)    AST 52 (*)    GFR calc non Af Amer 37 (*)    GFR calc Af Amer 43 (*)    All other components within normal limits  ACETAMINOPHEN LEVEL - Abnormal; Notable for the following:    Acetaminophen (Tylenol), Serum <10 (*)    All other components within normal limits  TROPONIN I - Abnormal; Notable for the following:    Troponin I 0.49 (*)    All other components within normal limits  LACTIC ACID, PLASMA - Abnormal; Notable for the following:    Lactic Acid, Venous 3.0 (*)    All other components within normal limits  LIPASE, BLOOD - Abnormal; Notable for the following:    Lipase <10 (*)    All other components within normal limits  CBC WITH DIFFERENTIAL/PLATELET - Abnormal; Notable for the following:    RBC 5.56 (*)    MCH 25.9 (*)    RDW 16.2 (*)    Lymphs Abs 0.6 (*)    All other components within normal limits  URINALYSIS, COMPLETE (UACMP) WITH MICROSCOPIC - Abnormal; Notable for the following:    Color, Urine YELLOW (*)    APPearance CLOUDY (*)    Hgb urine dipstick MODERATE (*)    Ketones,  ur 5 (*)    Protein, ur >=300 (*)    Nitrite POSITIVE (*)    Leukocytes, UA TRACE (*)    Bacteria, UA MANY (*)    Squamous Epithelial / LPF 0-5 (*)    All other components within normal limits  COOXEMETRY PANEL - Abnormal; Notable for the following:    Carboxyhemoglobin 2.3 (*)    All other components within normal limits  URINE CULTURE  LACTIC ACID, PLASMA  BLOOD GAS, ARTERIAL   ____________________________________________  EKG  KG read and interpreted by me as  normal sinus rhythm rate of 86 normal axis no acute changes. ____________________________________________  RADIOLOGY  Study Result   CLINICAL DATA:  Unresponsive  EXAM: CT HEAD WITHOUT CONTRAST  TECHNIQUE: Contiguous axial images were obtained from the base of the skull through the vertex without intravenous contrast.  COMPARISON:  01/09/2016  FINDINGS: Brain: Diffuse atrophic changes and chronic white matter ischemic changes are seen. These changes are stable from the prior exam. No findings to suggest acute hemorrhage or acute infarction are seen.  Vascular: No hyperdense vessel or unexpected calcification.  Skull: Normal. Negative for fracture or focal lesion.  Sinuses/Orbits: No acute finding.  Other: None.  IMPRESSION: Chronic atrophic and ischemic changes without acute abnormality.   Electronically Signed   By: Alcide CleverMark  Lukens M.D.   On: 02/25/2016 13:12     ____________________________________________   PROCEDURES  Procedure(s) performed:   Procedures  Critical Care performed:  ____________________________________________   INITIAL IMPRESSION / ASSESSMENT AND PLAN / ED COURSE  Pertinent labs & imaging results that were available during my care of the patient were reviewed by me and considered in my medical decision making (see chart for details).    Clinical Course      ____________________________________________   FINAL CLINICAL IMPRESSION(S) / ED  DIAGNOSES  Final diagnoses:  Hypothermia, initial encounter  Lactic acidosis  Elevated troponin      NEW MEDICATIONS STARTED DURING THIS VISIT:  New Prescriptions   No medications on file     Note:  This document was prepared using Dragon voice recognition software and may include unintentional dictation errors.    Arnaldo NatalPaul F Malinda, MD 02/25/16 2028

## 2016-02-25 NOTE — ED Notes (Signed)
Pt in and out cathed by this RN and Lennice SitesJill Rn. Pt cleaned prior to in and out. Had diaper residue on bottom. Small red pressure ulcer at top of sacrum. Not very deep. Chuck and brief placed after cath done.   Warm blankets and bear hugger placed on pt.

## 2016-02-25 NOTE — ED Triage Notes (Signed)
Pt presents to ED via [facility] from home. DSS worker went out to house, was not able to get in, looked through a window and found daughter sitting on floor with pt lying on floor around corner. EMS states daughter had fallen and hit head and [facility]. Unknown how long pt was on floor or if she fell. Pt has dementia, DM, HTN, depression. Per EMS pt did not make sense to them while talking. Pt answers questions that Dr. Darnelle CatalanMalinda asked her. Pt not talking to staff otherwise. Pt responds to pain from IV sticks. Pt has a strong urine smell on her.   EMS VS 194/112, CBG 196, HR 100-105's. Oxygen dropped when HR dropped.

## 2016-02-25 NOTE — ED Notes (Signed)
Cousin at bedside. Pt more interactive and talking. Alert and oriented to person only.

## 2016-02-25 NOTE — Progress Notes (Signed)
Anticoagulation monitoring(Lovenox):  82 yo  female ordered Lovenox 40 mg Q24h  Filed Weights   02/25/16 1239  Weight: 135 lb 6.4 oz (61.4 kg)   BMI    Lab Results  Component Value Date   CREATININE 1.28 (H) 02/25/2016   CREATININE 0.94 01/08/2016   CREATININE 1.76 (H) 11/03/2014   Estimated Creatinine Clearance: 26.1 mL/min (by C-G formula based on SCr of 1.28 mg/dL (H)). Hemoglobin & Hematocrit     Component Value Date/Time   HGB 14.4 02/25/2016 1227   HGB 11.9 (L) 11/26/2013 1026   HCT 44.5 02/25/2016 1227   HCT 38.0 11/26/2013 1026     Per Protocol for Patient with estCrcl < 30 ml/min and BMI < 40, will transition to Lovenox 30 mg Q24h.

## 2016-02-25 NOTE — ED Notes (Signed)
Pt taken off bear hugger when temp reached 99

## 2016-02-25 NOTE — H&P (Signed)
[facility] [place] [facility]   PATIENT NAME: Jamie FilesBernice Bueso    MR#:  [id number]  DATE OF BIRTH:  09/14/1927  DATE OF ADMISSION:  02/25/2016  PRIMARY CARE PHYSICIAN: Rafael BihariWALKER III, JOHN B, MD   REQUESTING/REFERRING PHYSICIAN: Dr. Darnelle CatalanMalinda  CHIEF COMPLAINT:   Altered mental status HISTORY OF PRESENT ILLNESS:  Jamie Glass  is a 82 y.o. female with a known history of Dementia, chronic systolic heart failure ejection fraction of 40-45% and chronic kidney disease stage III who presents to the emergency room for above complaint. Patient has dementia and no family is at bedside. [facility] after suffering a head injury  DSS worker had come by the house to give out Christmas present and found patient's daughter on the floor. Patient herself had a strong smell of urine and so EMS was called. Emergency room labs are consistent with UTI.  PAST MEDICAL HISTORY:   Past Medical History:  Diagnosis Date  . Anxiety   . Arthritis   . CHF (congestive heart failure) (HCC)   . Chronic kidney disease   . Dementia   . Diabetes mellitus without complication (HCC)   . Hypertension   . Stress headaches 08/21/2014    PAST SURGICAL HISTORY:  History reviewed. No pertinent surgical history.  SOCIAL HISTORY:   Social History  Substance Use Topics  . Smoking status: Current Every Day Smoker    Packs/day: 0.50    Years: 84.00    Types: Cigarettes  . Smokeless tobacco: Never Used  . Alcohol use No    FAMILY HISTORY:   Family History  Problem Relation Age of Onset  . Hypertension Other     DRUG ALLERGIES:  No Known Allergies  REVIEW OF SYSTEMS:   Review of Systems  Unable to perform ROS: Dementia    MEDICATIONS AT HOME:   Prior to Admission medications   Medication Sig Start Date End Date Taking? Authorizing Provider  amLODipine (NORVASC) 5 MG tablet Take 5 mg by mouth daily.    Historical Provider, MD  aspirin EC 81 MG tablet Take 81  mg by mouth daily.    Historical Provider, MD  furosemide (LASIX) 20 MG tablet Take 20 mg by mouth daily.    Historical Provider, MD  gabapentin (NEURONTIN) 100 MG capsule Take 100 mg by mouth 2 (two) times daily.     Historical Provider, MD  isosorbide mononitrate (IMDUR) 30 MG 24 hr tablet Take 30 mg by mouth daily.    Historical Provider, MD  metoprolol tartrate (LOPRESSOR) 25 MG tablet Take 25 mg by mouth 2 (two) times daily.    Historical Provider, MD  OLANZapine zydis (ZYPREXA) 5 MG disintegrating tablet Take 5 mg by mouth at bedtime.    Historical Provider, MD  pravastatin (PRAVACHOL) 20 MG tablet Take 20 mg by mouth daily.    Historical Provider, MD      VITAL SIGNS:  Blood pressure (!) 173/89, pulse (!) 101, temperature 97.2 F (36.2 C), resp. rate (!) 22, weight 61.4 kg (135 lb 6.4 oz), SpO2 100 %.  PHYSICAL EXAMINATION:   Physical Exam  Constitutional: She is well-developed, well-nourished, and in no distress. No distress.  Patient has bare hugger on  HENT:  Head: Normocephalic.  Eyes: No scleral icterus.  Neck: Normal range of motion. Neck supple. No JVD present. No tracheal deviation present.  Cardiovascular: Normal rate, regular rhythm and normal heart sounds.  Exam reveals no gallop and no friction rub.  No murmur heard. Pulmonary/Chest: Effort normal and breath sounds normal. No respiratory distress. She has no wheezes. She has no rales. She exhibits no tenderness.  Abdominal: Soft. Bowel sounds are normal. She exhibits no distension and no mass. There is no tenderness. There is no rebound and no guarding.  Musculoskeletal: Normal range of motion. She exhibits no edema.  Able to move all extremities  Neurological: She is alert.  Skin: Skin is warm. No rash noted. No erythema.      LABORATORY PANEL:   CBC  Recent Labs Lab 02/25/16 1227  WBC 5.8  HGB 14.4  HCT 44.5  PLT 218    ------------------------------------------------------------------------------------------------------------------  Chemistries   Recent Labs Lab 02/25/16 1227  NA 138  K 4.0  CL 101  CO2 24  GLUCOSE 149*  BUN 17  CREATININE 1.28*  CALCIUM 9.9  AST 52*  ALT 16  ALKPHOS 82  BILITOT 1.1   ------------------------------------------------------------------------------------------------------------------  Cardiac Enzymes  Recent Labs Lab 02/25/16 1227  TROPONINI 0.49*   ------------------------------------------------------------------------------------------------------------------  RADIOLOGY:  Ct Head Wo Contrast  Result Date: 02/25/2016 CLINICAL DATA:  Unresponsive EXAM: CT HEAD WITHOUT CONTRAST TECHNIQUE: Contiguous axial images were obtained from the base of the skull through the vertex without intravenous contrast. COMPARISON:  01/09/2016 FINDINGS: Brain: Diffuse atrophic changes and chronic white matter ischemic changes are seen. These changes are stable from the prior exam. No findings to suggest acute hemorrhage or acute infarction are seen. Vascular: No hyperdense vessel or unexpected calcification. Skull: Normal. Negative for fracture or focal lesion. Sinuses/Orbits: No acute finding. Other: None. IMPRESSION: Chronic atrophic and ischemic changes without acute abnormality. Electronically Signed   By: Alcide CleverMark  Lukens M.D.   On: 02/25/2016 13:12   Dg Chest Portable 1 View  Result Date: 02/25/2016 CLINICAL DATA:  History of dementia and diabetes. Patient found down. EXAM: PORTABLE CHEST 1 VIEW COMPARISON:  Chest x-ray of January 01, 2016 FINDINGS: The lungs are well-expanded and clear. The heart and pulmonary vascularity are normal. The mediastinum is normal in width. There is no pleural effusion. There is calcification in the wall of the aortic arch. The bony thorax exhibits no acute abnormality. IMPRESSION: There is no acute cardiopulmonary abnormality. Thoracic aortic  atherosclerosis. Electronically Signed   By: David  [place]azil[place]Jordan M.D.   On: 02/25/2016 14:35    EKG:   Sinus rhythm no ST elevation or depression old Q waves in anterior leads  IMPRESSION AND PLAN:    82 year old female with history of dementia who presents via EMS with code sepsis and UTI.    1. Sepsis: Patient presents with hypothermia and tachycardia/lactic acidosis due to urinary tract infection Continue IV fluids Start Rocephin for UTI Follow-up and urine culture Patient was initially hypothermic. Will try to discontinue bare hugger now that temperature has normalized 2. UTI: Continue Rocephin 3. Elevated troponin: Patient will be evaluated for ACS with serial troponins. Cardiology consult needed if troponins continue to increase 4. Acute kidney injury due to sepsis and dehydration IV fluids and repeat BMP in a.m.  5. Chronic systolic heart failure ejection fraction 40-45% by echo in 2015: Due to problem #4 we will hold Lasix for today Continue isosorbide and metoprolol  6. Hyperlipidemia on statin  7. Dementia on olanzapine  8. Hypertension: Continue Norvasc, isosorbide and metoprolol with when necessary hydralazine  Patient will need physical therapy and social work/case management consult for disposition All the records are reviewed and case discussed with ED provider    CODE STATUS: FULL  TOTAL  TIME TAKING CARE OF THIS PATIENT: 45 minutes.    Shannon Balthazar M.D on 02/25/2016 at 2:47 PM  Between 7am to 6pm - Pager - [phone]  After 6pm go to [email or web address] - Social research officer, government  [facility]  Office  [phone]  CC: Primary care physician; Rafael Bihari, MD

## 2016-02-26 LAB — BASIC METABOLIC PANEL
ANION GAP: 10 (ref 5–15)
BUN: 18 mg/dL (ref 6–20)
CHLORIDE: 105 mmol/L (ref 101–111)
CO2: 23 mmol/L (ref 22–32)
Calcium: 9.2 mg/dL (ref 8.9–10.3)
Creatinine, Ser: 1.28 mg/dL — ABNORMAL HIGH (ref 0.44–1.00)
GFR calc Af Amer: 43 mL/min — ABNORMAL LOW (ref >60)
GFR, EST NON AFRICAN AMERICAN: 37 mL/min — AB (ref >60)
Glucose, Bld: 120 mg/dL — ABNORMAL HIGH (ref 65–99)
POTASSIUM: 3.9 mmol/L (ref 3.5–5.1)
SODIUM: 138 mmol/L (ref 135–145)

## 2016-02-26 LAB — CBC
HEMATOCRIT: 38.2 % (ref 35.0–47.0)
Hemoglobin: 12.6 g/dL (ref 12.0–16.0)
MCH: 26.3 pg (ref 26.0–34.0)
MCHC: 33.1 g/dL (ref 32.0–36.0)
MCV: 79.5 fL — ABNORMAL LOW (ref 80.0–100.0)
Platelets: 185 10*3/uL (ref 150–440)
RBC: 4.8 MIL/uL (ref 3.80–5.20)
RDW: 15.8 % — AB (ref 11.5–14.5)
WBC: 5.8 10*3/uL (ref 3.6–11.0)

## 2016-02-26 LAB — BLOOD GAS, ARTERIAL
ACID-BASE DEFICIT: 2.9 mmol/L — AB (ref 0.0–2.0)
Bicarbonate: 20.9 mmol/L (ref 20.0–28.0)
FIO2: 0.21
O2 SAT: 93.1 %
PCO2 ART: 33 mmHg (ref 32.0–48.0)
pH, Arterial: 7.41 (ref 7.350–7.450)
pO2, Arterial: 61 mmHg — ABNORMAL LOW (ref 83.0–108.0)

## 2016-02-26 LAB — CK: CK TOTAL: 747 U/L — AB (ref 38–234)

## 2016-02-26 LAB — TROPONIN I: TROPONIN I: 0.27 ng/mL — AB (ref <0.03)

## 2016-02-26 NOTE — Plan of Care (Signed)
Problem: Safety: Goal: Ability to remain free from injury will improve Outcome: Progressing Pt with dementia, bed alarm set. Call bell within reach.  Problem: Physical Regulation: Goal: Ability to maintain clinical measurements within normal limits will improve Outcome: Not Progressing Pt with bathroom privileges.   Problem: Skin Integrity: Goal: Risk for impaired skin integrity will decrease Outcome: Not Progressing PU present upon admission to hospital, pt continues to scratch arms and legs.   Problem: Fluid Volume: Goal: Ability to maintain a balanced intake and output will improve Outcome: Progressing Enc PO intake.

## 2016-02-26 NOTE — Progress Notes (Signed)
[facility] [place] [facility]   PATIENT NAME: Jamie FilesBernice Glass    MRN#:  [id number]  DATE OF BIRTH:  08/21/1927  SUBJECTIVE:  Hospital Day: 1 day Jamie Glass is a 82 y.o. female presenting with Urinary Tract Infection and Code Sepsis .   Overnight events: no acute overnight events Interval Events: patient unable to provide meaningful information   REVIEW OF SYSTEMS:  Unable to obtain given patient mental status  DRUG ALLERGIES:  No Known Allergies  VITALS:  Blood pressure (!) 112/48, pulse 78, temperature 98 F (36.7 C), temperature source Oral, resp. rate 20, height 5\' 3"  (1.6 m), weight 61.4 kg (135 lb 6.4 oz), SpO2 96 %.  PHYSICAL EXAMINATION:   VITAL SIGNS: Vitals:   02/25/16 1944 02/26/16 1401  BP: (!) 169/83 (!) 112/48  Pulse: (!) 102 78  Resp: 18 20  Temp: 97.6 F (36.4 C) 98 F (36.7 C)   GENERAL:82 y.o.female moderate distress given mental status.  HEAD: Normocephalic, atraumatic.  EYES: Pupils equal, round, reactive to light. Unable to assess extraocular muscles given mental status/medical condition. No scleral icterus.  MOUTH: dry mucosal membrane. Dentition intact. No abscess noted.  EAR, NOSE, THROAT: Clear without exudates. No external lesions.  NECK: Supple. No thyromegaly. No nodules. No JVD.  PULMONARY: Clear to ascultation, without wheeze rails or rhonci. No use of accessory muscles, Good respiratory effort. good air entry bilaterally CHEST: Nontender to palpation.  CARDIOVASCULAR: S1 and S2. Regular rate and rhythm. No murmurs, rubs, or gallops. No edema. Pedal pulses 2+ bilaterally.  GASTROINTESTINAL: Soft, nontender, nondistended. No masses. Positive bowel sounds. No hepatosplenomegaly.  MUSCULOSKELETAL: No swelling, clubbing, or edema. Range of motion full in all extremities.  NEUROLOGIC: Unable to assess given mental status/medical condition SKIN: No ulceration, lesions, rashes, or cyanosis. Skin warm and dry.  Turgor intact.  PSYCHIATRIC: Unable to assess given mental status/medical condition - oriented x0 this morning      LABORATORY PANEL:   CBC  Recent Labs Lab 02/26/16 0515  WBC 5.8  HGB 12.6  HCT 38.2  PLT 185   ------------------------------------------------------------------------------------------------------------------  Chemistries   Recent Labs Lab 02/25/16 1227 02/26/16 0515  NA 138 138  K 4.0 3.9  CL 101 105  CO2 24 23  GLUCOSE 149* 120*  BUN 17 18  CREATININE 1.28* 1.28*  CALCIUM 9.9 9.2  AST 52*  --   ALT 16  --   ALKPHOS 82  --   BILITOT 1.1  --    ------------------------------------------------------------------------------------------------------------------  Cardiac Enzymes  Recent Labs Lab 02/26/16 0515  TROPONINI 0.27*   ------------------------------------------------------------------------------------------------------------------  RADIOLOGY:  Ct Head Wo Contrast  Result Date: 02/25/2016 CLINICAL DATA:  Unresponsive EXAM: CT HEAD WITHOUT CONTRAST TECHNIQUE: Contiguous axial images were obtained from the base of the skull through the vertex without intravenous contrast. COMPARISON:  01/09/2016 FINDINGS: Brain: Diffuse atrophic changes and chronic white matter ischemic changes are seen. These changes are stable from the prior exam. No findings to suggest acute hemorrhage or acute infarction are seen. Vascular: No hyperdense vessel or unexpected calcification. Skull: Normal. Negative for fracture or focal lesion. Sinuses/Orbits: No acute finding. Other: None. IMPRESSION: Chronic atrophic and ischemic changes without acute abnormality. Electronically Signed   By: Alcide CleverMark  Lukens M.D.   On: 02/25/2016 13:12   Dg Chest Portable 1 View  Result Date: 02/25/2016 CLINICAL DATA:  History of dementia and diabetes. Patient found down. EXAM: PORTABLE CHEST 1 VIEW COMPARISON:  Chest x-ray of January 01, 2016 FINDINGS:  The lungs are well-expanded and  clear. The heart and pulmonary vascularity are normal. The mediastinum is normal in width. There is no pleural effusion. There is calcification in the wall of the aortic arch. The bony thorax exhibits no acute abnormality. IMPRESSION: There is no acute cardiopulmonary abnormality. Thoracic aortic atherosclerosis. Electronically Signed   By: Analeese Andreatta  [place]azil[place]Jordan M.D.   On: 02/25/2016 14:35    EKG:   Orders placed or performed during the hospital encounter of 02/25/16  . ED EKG  . ED EKG    ASSESSMENT AND PLAN:   Jamie Glass is a 82 y.o. female presenting with Urinary Tract Infection and Code Sepsis . Admitted 02/25/2016 : Day #: 1 day 1. Sepsis: present on admission: UTI - continue ceftriaxone day #2, follow culture 2. Elevated troponin: in setting of unknown downtime - suspect rhabdo - check CK 3. Chronic systolic CHF: continue imdur, bb, lasix on hold 4. Hyperlipidemia unspecified: statin 5. htn essential: continue home meds  [facility], patient has had DSS involved  All the records are reviewed and case discussed with Care Management/Social Workerr. Management plans discussed with the patient, family and they are in agreement.  CODE STATUS: full TOTAL TIME TAKING CARE OF THIS PATIENT: 33 minutes.   POSSIBLE D/C IN 2-3DAYS, DEPENDING ON CLINICAL CONDITION.   Kamdin Follett,  Mardi MainlandDavid K M.D on 02/26/2016 at 3:08 PM  Between 7am to 6pm - Pager - [phone]  After 6pm: House Pager: - [phone]  [facility]  Office  [phone]  CC: Primary care physician; Jamie BihariWALKER Glass, Jamie B, MD

## 2016-02-26 NOTE — Progress Notes (Signed)
This nurse contacted by Jamie Glass.  [name] is the DSS Legal Guardian of the pt.  Paperwork was faxed from Ms. Jamie Dragonarr and received by myself stating that [name] is in fact legal guardian.  Paperwork has been added to pt's chart and emergency contact information updated.  [name], RN

## 2016-02-26 NOTE — Consult Note (Signed)
WOC Nurse wound consult note Reason for Consult: Consult requested for sacrum and bilat heels.  Pt is very  emaciated and immobile Wound type: Sacum with stage 2 pressure injury; .4X.2X.1cm, pink and dry, no odor or drainage Left outer heel with dry scab which peels easily, revealing pink dry skin, .5X.5cm.  Appears to be a previous blister; stage 2 pressure injury which is healing. Left  heel with dry scab which peels easily, revealing pink dry skin, 2.5X2.5cm.  Appears to be a previous blister; stage 2 pressure injury which is healing. Pressure Ulcer POA: Yes Dressing procedure/placement/frequency: Foam dressings to protect from further injury.  Float heels to reduce pressure. No family members present to discuss plan of care. Please re-consult if further assistance is needed.  Thank-you,  Cammie Mcgeeawn Rumor Sun MSN, RN, CWOCN, [place]-AP, CNS [phone]

## 2016-02-26 NOTE — Evaluation (Signed)
Physical Therapy Evaluation Patient Details Name: Jamie Glass MRN: [id number] DOB: 01/20/1928 Today's Date: 02/26/2016   History of Present Illness  presented to ER status post fall in home environment, found on floor; admitted wiht sepsis related to UTI.  Clinical Impression  Patient generally confused, oriented to self only; poor awareness of deficits and need for assist with all mobility. Bilat UEs generally restless, constantly grabbing/rolling things with hands.  Currently requiring min/mod assist with RW for all mobility.  Increased sway in all planes with standing activities/gait, requiring constant assist from therapist for walker management and balance correction.  Unsafe to attempt without RW and +1 assist at this time. Would benefit from skilled PT to address above deficits and promote optimal return to PLOF; recommend transition to STR upon discharge from acute hospitalization.     Follow Up Recommendations SNF    Equipment Recommendations       Recommendations for Other Services       Precautions / Restrictions Precautions Precautions: Fall Restrictions Weight Bearing Restrictions: No      Mobility  Bed Mobility Overal bed mobility: Needs Assistance Bed Mobility: Supine to Sit     Supine to sit: Min assist;Mod assist     General bed mobility comments: hand-over-hand to initiate movement  Transfers Overall transfer level: Needs assistance Equipment used: Rolling walker (2 wheeled) Transfers: Sit to/from Stand Sit to Stand: Min assist;Mod assist            Ambulation/Gait Ambulation/Gait assistance: Min assist Ambulation Distance (Feet): 5 Feet Assistive device: Rolling walker (2 wheeled)       General Gait Details: forward flexed posture, short choppy steps; poor balance  Stairs            Wheelchair Mobility    Modified Rankin (Stroke Patients Only)       Balance Overall balance assessment: Needs assistance Sitting-balance  support: No upper extremity supported;Feet supported Sitting balance-Leahy Scale: Good     Standing balance support: Bilateral upper extremity supported Standing balance-Leahy Scale: Poor                               Pertinent Vitals/Pain Pain Assessment: No/denies pain    Home Living Family/patient expects to be discharged to:: Private residence Living Arrangements: Children Available Help at Discharge: Family Type of Home: House Home Access: Stairs to enter Entrance Stairs-Rails: Right Entrance Stairs-Number of Steps: 4 Home Layout: One level   Additional Comments: Per RN, patient[facility] (was also found on floor at time patient was found)    Prior Function Level of Independence: Needs assistance         Comments: Assist for ADLs as needed, sup with [facility] for household mobility; frequent falls per RN     Hand Dominance        Extremity/Trunk Assessment   Upper Extremity Assessment Upper Extremity Assessment: Generalized weakness (grossly at least 3+/5)    Lower Extremity Assessment Lower Extremity Assessment: Generalized weakness (grossly at least 3+/5)       Communication   Communication: HOH  Cognition Arousal/Alertness: Awake/alert Behavior During Therapy: WFL for tasks assessed/performed Overall Cognitive Status: History of cognitive impairments - at baseline (poor initiation/problem solving, poor attention to task, poor STM)                      General Comments      Exercises Other Exercises  Other Exercises: 3930' with RW, min assist-maintains forward flexed posture; inconsistent step height/length (improved with manual advancement of RW and open pathways); walker frequently veers to R, constant assist from therapist for management   Assessment/Plan    PT Assessment Patient needs continued PT services  PT Problem List Decreased strength;Decreased activity tolerance;Decreased  balance;Decreased mobility;Decreased coordination;Decreased cognition;Decreased knowledge of precautions;Decreased safety awareness;Decreased knowledge of use of DME          PT Treatment Interventions DME instruction;Gait training;Stair training;Functional mobility training;Therapeutic activities;Therapeutic exercise;Balance training;Patient/family education    PT Goals (Current goals can be found in the Care Plan section)  Acute Rehab PT Goals PT Goal Formulation: Patient unable to participate in goal setting Time For Goal Achievement: 03/11/16 Potential to Achieve Goals: Fair    Frequency Min 2X/week   Barriers to discharge Decreased caregiver support      Co-evaluation               End of Session Equipment Utilized During Treatment: Gait belt Activity Tolerance: Patient tolerated treatment well Patient left: in chair;with chair alarm set;with family/visitor present;with call bell/phone within reach Nurse Communication: Mobility status         Time: 1131-1153 PT Time Calculation (min) (ACUTE ONLY): 22 min   Charges:   PT Evaluation $PT Eval Low Complexity: 1 Procedure PT Treatments $Gait Training: 8-22 mins   PT G Codes:        Zollie Clemence H. Manson PasseyBrown, PT, DPT, NCS 02/26/16, 1:49 PM [phone]

## 2016-02-26 NOTE — Care Management Important Message (Signed)
Important Message  Patient Details  Name: Jamie Glass MRN: [id number] Date of Birth: 03/15/1927   Medicare Important Message Given:  Yes    Gwenette GreetBrenda S Jaeger Trueheart, RN 02/26/2016, 8:45 AM

## 2016-02-27 LAB — BASIC METABOLIC PANEL
ANION GAP: 7 (ref 5–15)
BUN: 22 mg/dL — ABNORMAL HIGH (ref 6–20)
CHLORIDE: 110 mmol/L (ref 101–111)
CO2: 22 mmol/L (ref 22–32)
Calcium: 8.7 mg/dL — ABNORMAL LOW (ref 8.9–10.3)
Creatinine, Ser: 1.33 mg/dL — ABNORMAL HIGH (ref 0.44–1.00)
GFR calc Af Amer: 41 mL/min — ABNORMAL LOW (ref >60)
GFR, EST NON AFRICAN AMERICAN: 35 mL/min — AB (ref >60)
Glucose, Bld: 92 mg/dL (ref 65–99)
POTASSIUM: 3.7 mmol/L (ref 3.5–5.1)
SODIUM: 139 mmol/L (ref 135–145)

## 2016-02-27 LAB — CK: CK TOTAL: 399 U/L — AB (ref 38–234)

## 2016-02-27 MED ORDER — AMLODIPINE BESYLATE 10 MG PO TABS
10.0000 mg | ORAL_TABLET | Freq: Every day | ORAL | Status: DC
  Administered 2016-02-27 – 2016-02-28 (×2): 10 mg via ORAL
  Filled 2016-02-27 (×2): qty 1

## 2016-02-27 NOTE — [place] FL2 (Signed)
[place] MEDICAID FL2 LEVEL OF CARE SCREENING TOOL     IDENTIFICATION  Patient Name: Jamie Glass Birthdate: 05/05/1927 Sex: female Admission Date (Current Location): 02/25/2016  [place]   Facility and Address:  [facility], [address], [place], [place] [address]      Provider Number: [id number]  Attending Physician Name and Address:  Wyatt Hasteavid K Hower, MD  Relative Name and Phone Number:     Saintclair HalstedCarr,Rhesha Scientist, forensic(Dss Legal Guardian)  [phone]       Current Level of Care: Hospital Recommended Level of Care: Skilled Nursing Facility Prior Approval Number:    Date Approved/Denied:   PASRR Number: [id number]  Discharge Plan: SNF    Current Diagnoses: Patient Active Problem List   Diagnosis Date Noted  . Sepsis (HCC) 02/25/2016  . Pressure injury of skin 02/25/2016  . Pressure ulcer 11/03/2014  . Protein-calorie malnutrition, severe (HCC) 11/02/2014  . Acute renal failure (HCC) 10/31/2014  . Hyperkalemia 10/31/2014    Orientation RESPIRATION BLADDER Height & Weight     Self  Normal Incontinent Weight: 135 lb 6.4 oz (61.4 kg) Height:  5\' 3"  (160 cm)  BEHAVIORAL SYMPTOMS/MOOD NEUROLOGICAL BOWEL NUTRITION STATUS      Incontinent Diet (Mechanical Soft)  AMBULATORY STATUS COMMUNICATION OF NEEDS Skin   Limited Assist Verbally PU Stage and Appropriate Care   PU Stage 2 Dressing:  (PRN)                   Personal Care Assistance Level of Assistance  Bathing, Feeding, Dressing Bathing Assistance: Limited assistance Feeding assistance: Limited assistance Dressing Assistance: Limited assistance     Functional Limitations Info  Sight, Hearing, Speech Sight Info: Adequate Hearing Info: Adequate Speech Info: Adequate    SPECIAL CARE FACTORS FREQUENCY  PT (By licensed PT)     PT Frequency: 5x a week              Contractures Contractures Info: Not present    Additional Factors Info  Code  Status, Allergies, Psychotropic Code Status Info: Full Code Allergies Info: NKA Psychotropic Info: OLANZapine zydis (ZYPREXA) disintegrating tablet 5 mg         Current Medications (02/27/2016):  This is the current hospital active medication list Current Facility-Administered Medications  Medication Dose Route Frequency Provider Last Rate Last Dose  . 0.9 %  sodium chloride infusion   Intravenous Continuous Adrian SaranSital Mody, MD 75 mL/hr at 02/27/16 0536    . acetaminophen (TYLENOL) tablet 650 mg  650 mg Oral Q6H PRN Adrian SaranSital Mody, MD       Or  . acetaminophen (TYLENOL) suppository 650 mg  650 mg Rectal Q6H PRN Sital Mody, MD      . amLODipine (NORVASC) tablet 10 mg  10 mg Oral Daily Wyatt Hasteavid K Hower, MD      . aspirin EC tablet 81 mg  81 mg Oral Daily Adrian SaranSital Mody, MD   81 mg at 02/26/16 1028  . cefTRIAXone (ROCEPHIN) IVPB 1 g  1 g Intravenous Q24H Jodelle RedMary M Smith RiverSwayne, RPH   1 g at 02/26/16 1749  . enoxaparin (LOVENOX) injection 30 mg  30 mg Subcutaneous Q24H Adrian SaranSital Mody, MD   30 mg at 02/26/16 2140  . gabapentin (NEURONTIN) capsule 100 mg  100 mg Oral BID Adrian SaranSital Mody, MD   100 mg at 02/26/16 2139  . hydrALAZINE (APRESOLINE) injection 10 mg  10 mg Intravenous Q6H PRN Adrian SaranSital Mody, MD      . HYDROcodone-acetaminophen (  NORCO/VICODIN) 5-325 MG per tablet 1-2 tablet  1-2 tablet Oral Q4H PRN Adrian SaranSital Mody, MD      . isosorbide mononitrate (IMDUR) 24 hr tablet 30 mg  30 mg Oral Daily Sital Mody, MD   30 mg at 02/26/16 1028  . metoprolol tartrate (LOPRESSOR) tablet 25 mg  25 mg Oral BID Adrian SaranSital Mody, MD   25 mg at 02/26/16 2139  . OLANZapine zydis (ZYPREXA) disintegrating tablet 5 mg  5 mg Oral QHS Adrian SaranSital Mody, MD   5 mg at 02/26/16 2139  . ondansetron (ZOFRAN) tablet 4 mg  4 mg Oral Q6H PRN Adrian SaranSital Mody, MD       Or  . ondansetron (ZOFRAN) injection 4 mg  4 mg Intravenous Q6H PRN Sital Mody, MD      . pravastatin (PRAVACHOL) tablet 20 mg  20 mg Oral Daily Adrian SaranSital Mody, MD   20 mg at 02/26/16 1028  . senna-docusate (Senokot-S)  tablet 1 tablet  1 tablet Oral QHS PRN Adrian SaranSital Mody, MD      . sodium chloride flush (NS) 0.9 % injection 3 mL  3 mL Intravenous Q12H Adrian SaranSital Mody, MD   3 mL at 02/26/16 2143     Discharge Medications: Please see discharge summary for a list of discharge medications.  Relevant Imaging Results:  Relevant Lab Results:   Additional Information SSN [id number]  Darleene Cleavernterhaus, Keyden Pavlov R, [place]WA

## 2016-02-27 NOTE — Progress Notes (Signed)
Physical Therapy Treatment Patient Details Name: Jamie Glass MRN: [id number] DOB: 05/27/1927 Today's Date: 02/27/2016    History of Present Illness presented to ER status post fall in home environment, found on floor; admitted wiht sepsis related to UTI.    PT Comments    [name] was sleeping upon arrival and upon awakening she was lethargic but agreeable to therapy.  She requires assist to initiate bed mobility and to stand and modA to pivot when turning to sit.  Pt will benefit from continued skilled PT services to increase functional independence and safety.  Follow Up Recommendations  SNF     Equipment Recommendations       Recommendations for Other Services       Precautions / Restrictions Precautions Precautions: Fall Restrictions Weight Bearing Restrictions: No    Mobility  Bed Mobility Overal bed mobility: Needs Assistance Bed Mobility: Supine to Sit     Supine to sit: Min assist;HOB elevated     General bed mobility comments: Hand over hand to initiate movement.  Once initiated pt only requires minA to elevate trunk.  Transfers Overall transfer level: Needs assistance Equipment used: Rolling walker (2 wheeled) Transfers: Sit to/from UGI CorporationStand;Stand Pivot Transfers Sit to Stand: Min assist Stand pivot transfers: Mod assist       General transfer comment: Hand over hand assist to initiate sit>stand but once initiated pt only requires min assist to boost to standing and to steady while doing so.  Mod A to remain steady and to manage RW when pivoting to sit in chair.  Ambulation/Gait Ambulation/Gait assistance: Min assist Ambulation Distance (Feet): 3 Feet Assistive device: Rolling walker (2 wheeled) Gait Pattern/deviations: Trunk flexed;Shuffle Gait velocity: decreased Gait velocity interpretation: Below normal speed for age/gender General Gait Details: Flexed posture and pt shuffling her feet.  MinA to steady   Risk analysttairs            Wheelchair  Mobility    Modified Rankin (Stroke Patients Only)       Balance Overall balance assessment: Needs assistance Sitting-balance support: No upper extremity supported;Feet supported Sitting balance-Leahy Scale: Good     Standing balance support: Bilateral upper extremity supported;During functional activity Standing balance-Leahy Scale: Poor Standing balance comment: Relies on UE support when standing                    Cognition Arousal/Alertness: Awake/alert Behavior During Therapy: WFL for tasks assessed/performed Overall Cognitive Status: History of cognitive impairments - at baseline                      Exercises General Exercises - Upper Extremity Shoulder Flexion: AROM;Both;5 reps;Seated General Exercises - Lower Extremity Hip ABduction/ADduction: AAROM;Both;5 reps;Seated Straight Leg Raises: AAROM;Left;5 reps;Seated    General Comments        Pertinent Vitals/Pain Pain Assessment: No/denies pain    Home Living                      Prior Function            PT Goals (current goals can now be found in the care plan section) Acute Rehab PT Goals Patient Stated Goal: none stated PT Goal Formulation: Patient unable to participate in goal setting Time For Goal Achievement: 03/11/16 Potential to Achieve Goals: Fair Progress towards PT goals: Progressing toward goals    Frequency    Min 2X/week      PT Plan Current plan remains appropriate  Co-evaluation             End of Session Equipment Utilized During Treatment: Gait belt Activity Tolerance: Patient limited by fatigue;Patient tolerated treatment well Patient left: in chair;with call bell/phone within reach;with chair alarm set;with nursing/sitter in room     Time: [id number] PT Time Calculation (min) (ACUTE ONLY): 18 min  Charges:  $Therapeutic Activity: 8-22 mins                    G Codes:       Encarnacion ChuAshley Abashian PT, DPT 02/27/2016, 1:51 PM

## 2016-02-27 NOTE — Clinical Social Work Placement (Signed)
   CLINICAL SOCIAL WORK PLACEMENT  NOTE  Date:  02/27/2016  Patient Details  Name: Jamie Glass MRN: [id number] Date of Birth: 10/21/1927  Clinical Social Work is seeking post-discharge placement for this patient at the Skilled  Nursing Facility level of care (*CSW will initial, date and re-position this form in  chart as items are completed):  Yes   Patient/family provided with [place] Clinical Social Work Department's list of facilities offering this level of care within the geographic area requested by the patient (or if unable, by the patient's family).  Yes   Patient/family informed of their freedom to choose among providers that offer the needed level of care, that participate in Medicare, Medicaid or managed care program needed by the patient, have an available bed and are willing to accept the patient.  Yes   Patient/family informed of [place]'s ownership interest in [facility] and [facility], as well as of the fact that they are under no obligation to receive care at these facilities.  PASRR submitted to EDS on 02/27/16     PASRR number received on 02/27/16     Existing PASRR number confirmed on       FL2 transmitted to all facilities in geographic area requested by pt/family on 02/27/16     FL2 transmitted to all facilities within larger geographic area on       Patient informed that his/her managed care company has contracts with or will negotiate with certain facilities, including the following:            Patient/family informed of bed offers received.  Patient chooses bed at       Physician recommends and patient chooses bed at      Patient to be transferred to   on  .  Patient to be transferred to facility by       Patient family notified on   of transfer.  Name of family member notified:        PHYSICIAN Please sign FL2     Additional Comment:    _______________________________________________ Darleene CleaverAnterhaus, Zakaria Sedor R, LCSWA 02/27/2016,  12:24 PM

## 2016-02-27 NOTE — Clinical Social Work Note (Signed)
Clinical Social Work Assessment  Patient Details  Name: Jamie FilesBernice Fetsch MRN: [id number] Date of Birth: 01/12/1928  Date of referral:  02/27/16               Reason for consult:  Facility Placement                Permission sought to share information with:  Facility Medical sales representativeContact Representative, Guardian Permission granted to share information::  Yes, Verbal Permission Granted  Name::     Regulatory affairs officerCarr,Rhesha (Dss Legal [phone]  Agency::  SNF admissions  Relationship::     Contact Information:     Housing/Transportation Living arrangements for the past 2 months:  Single Family Home Source of Information:  Guardian Patient Interpreter Needed:  None Criminal Activity/Legal Involvement Pertinent to Current Situation/Hospitalization:  No - Comment as needed Significant Relationships:  Other Family Members Lives with:  Self Do you feel safe going back to the place where you live?  No Need for family participation in patient care:  Yes (Comment)  Care giving concerns:  Patient is a ward of state and lives alone, guardian feels patient needs short term rehab.   Social Worker assessment / plan:  Patient is a 82 year old female who lives alone and has caregivers in the home.  Patient was sleeping and has some dementia, CSW completed assessment by speaking with the legal guardian Saintclair HalstedRhesha Carr [phone].  CSW was informed that patient has been living on her own and has not been in rehab before.  CSW discussed patient's progress with DSS and informed them that patient may be ready over the weekend or early next week, DSS gave CSW permission to begin bed search process.  Employment status:  Retired Database administratornsurance information:  Managed Medicare PT Recommendations:  Skilled Nursing Facility Information / Referral to community resources:  Skilled Nursing Facility  Patient/Family's Response to care:  Patient's guardian is in agreement to going to SNF for short term rehab.  Patient/Family's  Understanding of and Emotional Response to Diagnosis, Current Treatment, and Prognosis:  Patient was sleeping and did not wake up when CSW attempted to speak with her.  Guardain aware of current treatment plan.  Emotional Assessment Appearance:  Appears stated age Attitude/Demeanor/Rapport:    Affect (typically observed):  Calm, Appropriate Orientation:  Oriented to Self Alcohol / Substance use:  Not Applicable Psych involvement (Current and /or in the community):  No (Comment)  Discharge Needs  Concerns to be addressed:  Lack of Support Readmission within the last 30 days:  No Current discharge risk:  Lives alone, Lack of support system Barriers to Discharge:  No Barriers Identified   [place] Constablenterhaus, Shahed Yeoman R, LCSWA 02/27/2016, 12:16 PM

## 2016-02-27 NOTE — Care Management (Addendum)
Admitted to [facility] with the diagnosis of sepsis. Lives with daughter Harlen LabsCynthia Massett [phone]). Last seen Dr. Yates DecampJohn Walker 09/04/15. Followed by [facility] in the past. Outpatient Rehab services that this facility in the past. Uses a cane or rolling walker to aid in ambulation. In the emergency room following a mechanical fall  01/08/16.  [name] guardian is Phesha Teacher, English as a foreign languageCarr Social Worker at [place] [facility]. Gwenette GreetBrenda S Lucciano Vitali RN MSN CCM Care Management

## 2016-02-27 NOTE — Progress Notes (Signed)
[facility] [place] [facility]   PATIENT NAME: Terrilee FilesBernice Asmus    MRN#:  [id number]  DATE OF BIRTH:  09/09/1927  SUBJECTIVE:  Hospital Day: 2 days Terrilee FilesBernice Blandon is a 82 y.o. female presenting with Urinary Tract Infection and Code Sepsis .   Overnight events: no acute overnight events Interval Events: patient unable to provide meaningful information   REVIEW OF SYSTEMS:  Unable to obtain given patient mental status  DRUG ALLERGIES:  No Known Allergies  VITALS:  Blood pressure (!) 193/89, pulse 72, temperature 98.8 F (37.1 C), temperature source Oral, resp. rate 16, height 5\' 3"  (1.6 m), weight 61.4 kg (135 lb 6.4 oz), SpO2 100 %.  PHYSICAL EXAMINATION:   VITAL SIGNS: Vitals:   02/27/16 1039 02/27/16 1247  BP: (!) 173/69 (!) 193/89  Pulse: 67 72  Resp: 16   Temp: 98.4 F (36.9 C) 98.8 F (37.1 C)   GENERAL:82 y.o.female moderate distress given mental status.  HEAD: Normocephalic, atraumatic.  EYES: Pupils equal, round, reactive to light. Unable to assess extraocular muscles given mental status/medical condition. No scleral icterus.  MOUTH: dry mucosal membrane. Dentition intact. No abscess noted.  EAR, NOSE, THROAT: Clear without exudates. No external lesions.  NECK: Supple. No thyromegaly. No nodules. No JVD.  PULMONARY: Clear to ascultation, without wheeze rails or rhonci. No use of accessory muscles, Good respiratory effort. good air entry bilaterally CHEST: Nontender to palpation.  CARDIOVASCULAR: S1 and S2. Regular rate and rhythm. No murmurs, rubs, or gallops. No edema. Pedal pulses 2+ bilaterally.  GASTROINTESTINAL: Soft, nontender, nondistended. No masses. Positive bowel sounds. No hepatosplenomegaly.  MUSCULOSKELETAL: No swelling, clubbing, or edema. Range of motion full in all extremities.  NEUROLOGIC: Unable to assess given mental status/medical condition SKIN: No ulceration, lesions, rashes, or cyanosis. Skin warm and dry.  Turgor intact.  PSYCHIATRIC: Unable to assess given mental status/medical condition - oriented x0 this morning      LABORATORY PANEL:   CBC  Recent Labs Lab 02/26/16 0515  WBC 5.8  HGB 12.6  HCT 38.2  PLT 185   ------------------------------------------------------------------------------------------------------------------  Chemistries   Recent Labs Lab 02/25/16 1227  02/27/16 0518  NA 138  < > 139  K 4.0  < > 3.7  CL 101  < > 110  CO2 24  < > 22  GLUCOSE 149*  < > 92  BUN 17  < > 22*  CREATININE 1.28*  < > 1.33*  CALCIUM 9.9  < > 8.7*  AST 52*  --   --   ALT 16  --   --   ALKPHOS 82  --   --   BILITOT 1.1  --   --   < > = values in this interval not displayed. ------------------------------------------------------------------------------------------------------------------  Cardiac Enzymes  Recent Labs Lab 02/26/16 0515  TROPONINI 0.27*   ------------------------------------------------------------------------------------------------------------------  RADIOLOGY:  Dg Chest Portable 1 View  Result Date: 02/25/2016 CLINICAL DATA:  History of dementia and diabetes. Patient found down. EXAM: PORTABLE CHEST 1 VIEW COMPARISON:  Chest x-ray of January 01, 2016 FINDINGS: The lungs are well-expanded and clear. The heart and pulmonary vascularity are normal. The mediastinum is normal in width. There is no pleural effusion. There is calcification in the wall of the aortic arch. The bony thorax exhibits no acute abnormality. IMPRESSION: There is no acute cardiopulmonary abnormality. Thoracic aortic atherosclerosis. Electronically Signed   By: Masyn Fullam  [place] M.D.   On: 02/25/2016 14:35    EKG:   Orders  placed or performed during the hospital encounter of 02/25/16  . ED EKG  . ED EKG    ASSESSMENT AND PLAN:   Terrilee FilesBernice Babington is a 82 y.o. female presenting with Urinary Tract Infection and Code Sepsis . Admitted 02/25/2016 : Day #: 2 days 1. Sepsis: present on  admission: UTI - continue ceftriaxone day #3, follow culture 2. Rhabdomyolysis: Improvement, continue gentle IV fluids 3. Chronic systolic CHF: continue imdur, bb, lasix on hold 4. Hyperlipidemia unspecified: statin 5. htn essential: Increase Norvasc given uncontrolled state  Patients [facility], patient has had DSS involved  All the records are reviewed and case discussed with Care Management/Social Workerr. Management plans discussed with the patient, family and they are in agreement.  CODE STATUS: full TOTAL TIME TAKING CARE OF THIS PATIENT: 33 minutes.   POSSIBLE D/C IN 2-3DAYS, DEPENDING ON CLINICAL CONDITION.   Roan Sawch[place],  Mardi MainlandDavid K M.D on 02/27/2016 at 1:37 PM  Between 7am to 6pm - Pager - [phone]  After 6pm: House Pager: - [phone]  [facility]  Office  [phone]  CC: Primary care physician; Rafael BihariWALKER III, JOHN B, MD

## 2016-02-27 NOTE — Clinical Social Work Note (Signed)
CSW received phone call from [facility] who said they can accept patient when she is medically ready for discharge.  CSW updated DSS worker Saintclair HalstedRhesha Carr [phone] and presented bed offer, she accepted [facility].  CSW to continue to follow patient's progress throughout discharge planning.  Ervin KnackEric R. Aviel Davalos, MSW, Theresia MajorsLCSWA [phone]  Mon-Fri 8a-4:30p 02/27/2016 3:20 PM

## 2016-02-28 LAB — BASIC METABOLIC PANEL
ANION GAP: 7 (ref 5–15)
BUN: 16 mg/dL (ref 6–20)
CALCIUM: 8.6 mg/dL — AB (ref 8.9–10.3)
CHLORIDE: 111 mmol/L (ref 101–111)
CO2: 22 mmol/L (ref 22–32)
CREATININE: 1.04 mg/dL — AB (ref 0.44–1.00)
GFR calc non Af Amer: 47 mL/min — ABNORMAL LOW (ref >60)
GFR, EST AFRICAN AMERICAN: 55 mL/min — AB (ref >60)
Glucose, Bld: 82 mg/dL (ref 65–99)
Potassium: 3.6 mmol/L (ref 3.5–5.1)
SODIUM: 140 mmol/L (ref 135–145)

## 2016-02-28 LAB — URINE CULTURE
Culture: >=100000 — AB
SPECIAL REQUESTS: NORMAL

## 2016-02-28 LAB — CK: CK TOTAL: 218 U/L (ref 38–234)

## 2016-02-28 MED ORDER — CIPROFLOXACIN HCL 500 MG PO TABS: 500.0000 mg | ORAL_TABLET | Freq: Two times a day (BID) | ORAL | 0 refills | Status: AC

## 2016-02-28 MED ORDER — ISOSORBIDE MONONITRATE ER 60 MG PO TB24: 60.0000 mg | ORAL_TABLET | Freq: Every day | ORAL | 0 refills | Status: DC

## 2016-02-28 MED ORDER — CIPROFLOXACIN HCL 500 MG PO TABS
500.0000 mg | ORAL_TABLET | Freq: Two times a day (BID) | ORAL | Status: DC
  Administered 2016-02-28: 11:00:00 500 mg via ORAL
  Filled 2016-02-28: qty 1

## 2016-02-28 MED ORDER — AMLODIPINE BESYLATE 10 MG PO TABS: 10.0000 mg | ORAL_TABLET | Freq: Every day | ORAL | 0 refills | Status: DC

## 2016-02-28 NOTE — Progress Notes (Signed)
Pharmacy Antibiotic Note  Jamie Glass is Glass 82 y.o. female admitted on 02/25/2016 with UTI.  Pharmacy has been consulted for ORAL ciprofloxacin dosing.  Plan: Ciprofloxacin 500 mg PO Q12H  Height: 5\' 3"  (160 cm) Weight: 135 lb 6.4 oz (61.4 kg) IBW/kg (Calculated) : 52.4  Temp (24hrs), Avg:98.3 F (36.8 C), Min:97.7 F (36.5 C), Max:98.8 F (37.1 C)   Recent Labs Lab 02/25/16 1227 02/25/16 1634 02/26/16 0515 02/27/16 0518 02/28/16 0520  WBC 5.8  --  5.8  --   --   CREATININE 1.28*  --  1.28* 1.33* 1.04*  LATICACIDVEN 3.0* 1.5  --   --   --     Estimated Creatinine Clearance: 32.1 mL/min (by C-G formula based on SCr of 1.04 mg/dL (H)).    No Known Allergies  Thank you for allowing pharmacy to be Glass part of this patient's care.  Carola FrostNathan Glass Jamie Glass, Pharm.D., BCPS Clinical Pharmacist 02/28/2016 6:57 AM

## 2016-02-28 NOTE — Clinical Social Work Note (Signed)
Patient to discharge to [facility] today via EMS. Facility and on-call APS worker are aware. CSW will con't to follow pending any additional dc needs.  Argentina PonderKaren Martha Jadae Steinke, MSW, LCSW-A [phone]

## 2016-02-28 NOTE — Discharge Summary (Signed)
[employer] -  at [facility]   PATIENT NAME: Jamie Glass    MR#:  [id number]  DATE OF BIRTH:  10/25/1927  DATE OF ADMISSION:  02/25/2016 ADMITTING PHYSICIAN: Adrian SaranSital Emy Angevine, MD  DATE OF DISCHARGE: 02/28/2016  PRIMARY CARE PHYSICIAN: Rafael BihariWALKER III, JOHN B, MD    ADMISSION DIAGNOSIS:  Lactic acidosis [E87.2] Elevated troponin [R74.8] Hypothermia, initial encounter [T68.XXXA]  DISCHARGE DIAGNOSIS:  Active Problems:   Sepsis (HCC)   Pressure injury of skin   SECONDARY DIAGNOSIS:   Past Medical History:  Diagnosis Date  . Anxiety   . Arthritis   . CHF (congestive heart failure) (HCC)   . Chronic kidney disease   . Dementia   . Diabetes mellitus without complication (HCC)   . Hypertension   . Stress headaches 08/21/2014    HOSPITAL COURSE:   82 y.o. female with a known history of Dementia, chronic systolic heart failure ejection fraction of 40-45% and chronic kidney disease stage III who presents to the emergency room for AMS and sepsis.  1. Sepsis which was present on admission due to E coli UTI. She was initially on ceftriaxone. Urine cultures sensitive to ciprofloxacin which she will be discharged on.   2. Rhabdomyolysis: Improved with IV fluids 3. Chronic systolic CHF exacerbation: She will continue increased dose of imdur, metoprolol and Lasix   4. Hyperlipidemia unspecified: She will continue on statin 5. Essential hypertension: Due to elevated blood pressure Norvasc and isosorbide mononitrate were increased. 6. Elevated troponin: This is due to demand ischemia from sepsis and UTI and not ACS.  7. Acute kidney injury which resolved with IV fluids Patient's daughter is hospitalizationafter a fall patient has had DSS involvement during this hospital stay.   DISCHARGE CONDITIONS AND DIET:   Stable for discharge on soft diet  CONSULTS OBTAINED:    DRUG ALLERGIES:  No Known Allergies  DISCHARGE MEDICATIONS:   Current Discharge  Medication List    START taking these medications   Details  ciprofloxacin (CIPRO) 500 MG tablet Take 1 tablet (500 mg total) by mouth 2 (two) times daily. Qty: 8 tablet, Refills: 0      CONTINUE these medications which have CHANGED   Details  amLODipine (NORVASC) 10 MG tablet Take 1 tablet (10 mg total) by mouth daily. Qty: 30 tablet, Refills: 0    isosorbide mononitrate (IMDUR) 60 MG 24 hr tablet Take 1 tablet (60 mg total) by mouth daily. Qty: 30 tablet, Refills: 0      CONTINUE these medications which have NOT CHANGED   Details  aspirin EC 81 MG tablet Take 81 mg by mouth daily.    furosemide (LASIX) 20 MG tablet Take 20 mg by mouth daily.    gabapentin (NEURONTIN) 100 MG capsule Take 100 mg by mouth 2 (two) times daily.     metoprolol tartrate (LOPRESSOR) 25 MG tablet Take 25 mg by mouth 2 (two) times daily.    OLANZapine zydis (ZYPREXA) 5 MG disintegrating tablet Take 5 mg by mouth at bedtime.    pravastatin (PRAVACHOL) 20 MG tablet Take 20 mg by mouth daily.              Today   CHIEF COMPLAINT:  No acute events reported overnight   VITAL SIGNS:  Blood pressure (!) 194/90, pulse 75, temperature 97.7 F (36.5 C), temperature source Oral, resp. rate 20, height 5\' 3"  (1.6 m), weight 61.4 kg (135 lb 6.4 oz), SpO2 100 %.   REVIEW OF SYSTEMS:  Review of  Systems  Unable to perform ROS: Dementia     PHYSICAL EXAMINATION:  GENERAL:  82 y.o.-year-old patient Sitting up in the bed with no acute distress.  NECK:  Supple, no jugular venous distention. No thyroid enlargement, no tenderness.  LUNGS: Normal breath sounds bilaterally, no wheezing, rales,rhonchi  No use of accessory muscles of respiration.  CARDIOVASCULAR: S1, S2 normal. No murmurs, rubs, or gallops.  ABDOMEN: Soft, non-tender, non-distended. Bowel sounds present. No organomegaly or mass.  EXTREMITIES: No pedal edema, cyanosis, or clubbing.  PSYCHIATRIC: The patient is alert and oriented x  name SKIN: No obvious rash, lesion, or ulcer.   DATA REVIEW:   CBC  Recent Labs Lab 02/26/16 0515  WBC 5.8  HGB 12.6  HCT 38.2  PLT 185    Chemistries   Recent Labs Lab 02/25/16 1227  02/28/16 0520  NA 138  < > 140  K 4.0  < > 3.6  CL 101  < > 111  CO2 24  < > 22  GLUCOSE 149*  < > 82  BUN 17  < > 16  CREATININE 1.28*  < > 1.04*  CALCIUM 9.9  < > 8.6*  AST 52*  --   --   ALT 16  --   --   ALKPHOS 82  --   --   BILITOT 1.1  --   --   < > = values in this interval not displayed.  Cardiac Enzymes  Recent Labs Lab 02/25/16 1634 02/25/16 2217 02/26/16 0515  TROPONINI 0.44* 0.27* 0.27*    Microbiology Results  @MICRORSLT48 @  RADIOLOGY:  No results found.     Stable for discharge snf  Patient should follow up with pcp  CODE STATUS:     Code Status Orders        Start     Ordered   02/25/16 1744  Full code  Continuous     02/25/16 1743    Code Status History    Date Active Date Inactive Code Status Order ID Comments User Context   10/31/2014  8:27 PM 11/03/2014  5:17 PM Full Code [id number]  Wyatt Hasteavid K Hower, MD ED      TOTAL TIME TAKING CARE OF THIS PATIENT: 35 minutes.    Note: This dictation was prepared with Dragon dictation along with smaller phrase technology. Any transcriptional errors that result from this process are unintentional.  Miosotis Wetsel M.D on 02/28/2016 at 7:48 AM  Between 7am to 6pm - Pager - [phone] After 6pm go to [email or web address] - Social research officer, governmentpassword [email or web address]  [facility]  Office  [phone]  CC: Primary care physician; Rafael BihariWALKER III, JOHN B, MD

## 2016-02-28 NOTE — Progress Notes (Signed)
Report called to [facility] and EMS contacted for transport.  Orson Apeanielle Alizabeth Antonio, RN

## 2016-02-28 NOTE — Progress Notes (Signed)
Chaplain rounded the unit to provide a compassionate presence and support to the patient. Patient was sitting up in bed with a smile. Jefm PettyChaplain Tequlia Gonsalves [phone]

## 2016-03-01 LAB — CULTURE, BLOOD (ROUTINE X 2)
Culture: NO GROWTH
Culture: NO GROWTH

## 2016-03-16 ENCOUNTER — Other Ambulatory Visit
Admission: RE | Admit: 2016-03-16 | Discharge: 2016-03-16 | Disposition: A | Discharge status: Home / Self Care | Payer: Medicare Other | Source: Ambulatory Visit | Attending: Family Medicine | Admitting: Family Medicine

## 2016-03-16 DIAGNOSIS — R509 Fever, unspecified: Secondary | ICD-10-CM | POA: Diagnosis present

## 2016-03-16 LAB — C DIFFICILE QUICK SCREEN W PCR REFLEX
C DIFFICILE (CDIFF) INTERP: DETECTED
C DIFFICILE (CDIFF) TOXIN: POSITIVE — AB
C Diff antigen: POSITIVE — AB

## 2016-08-01 ENCOUNTER — Encounter: Payer: Self-pay | Admitting: Emergency Medicine

## 2016-08-01 ENCOUNTER — Emergency Department
Admission: EM | Admit: 2016-08-01 | Discharge: 2016-08-01 | Disposition: A | Discharge status: Home / Self Care | Payer: Medicare Other | Attending: Emergency Medicine | Admitting: Emergency Medicine

## 2016-08-01 ENCOUNTER — Emergency Department: Payer: Medicare Other

## 2016-08-01 DIAGNOSIS — Z79899 Other long term (current) drug therapy: Secondary | ICD-10-CM | POA: Insufficient documentation

## 2016-08-01 DIAGNOSIS — E1122 Type 2 diabetes mellitus with diabetic chronic kidney disease: Secondary | ICD-10-CM | POA: Diagnosis not present

## 2016-08-01 DIAGNOSIS — Y929 Unspecified place or not applicable: Secondary | ICD-10-CM | POA: Diagnosis not present

## 2016-08-01 DIAGNOSIS — S0993XA Unspecified injury of face, initial encounter: Secondary | ICD-10-CM | POA: Diagnosis present

## 2016-08-01 DIAGNOSIS — I509 Heart failure, unspecified: Secondary | ICD-10-CM | POA: Insufficient documentation

## 2016-08-01 DIAGNOSIS — W050XXA Fall from non-moving wheelchair, initial encounter: Secondary | ICD-10-CM | POA: Diagnosis not present

## 2016-08-01 DIAGNOSIS — I130 Hypertensive heart and chronic kidney disease with heart failure and stage 1 through stage 4 chronic kidney disease, or unspecified chronic kidney disease: Secondary | ICD-10-CM | POA: Insufficient documentation

## 2016-08-01 DIAGNOSIS — F0390 Unspecified dementia without behavioral disturbance: Secondary | ICD-10-CM | POA: Diagnosis not present

## 2016-08-01 DIAGNOSIS — S0181XA Laceration without foreign body of other part of head, initial encounter: Secondary | ICD-10-CM

## 2016-08-01 DIAGNOSIS — S01412A Laceration without foreign body of left cheek and temporomandibular area, initial encounter: Secondary | ICD-10-CM | POA: Insufficient documentation

## 2016-08-01 DIAGNOSIS — F17210 Nicotine dependence, cigarettes, uncomplicated: Secondary | ICD-10-CM | POA: Diagnosis not present

## 2016-08-01 DIAGNOSIS — N189 Chronic kidney disease, unspecified: Secondary | ICD-10-CM | POA: Insufficient documentation

## 2016-08-01 DIAGNOSIS — W19XXXA Unspecified fall, initial encounter: Secondary | ICD-10-CM

## 2016-08-01 DIAGNOSIS — Y939 Activity, unspecified: Secondary | ICD-10-CM | POA: Diagnosis not present

## 2016-08-01 DIAGNOSIS — Y999 Unspecified external cause status: Secondary | ICD-10-CM | POA: Insufficient documentation

## 2016-08-01 DIAGNOSIS — Z7982 Long term (current) use of aspirin: Secondary | ICD-10-CM | POA: Insufficient documentation

## 2016-08-01 MED ORDER — TETANUS-DIPHTH-ACELL PERTUSSIS 5-2.5-18.5 LF-MCG/0.5 IM SUSP
0.5000 mL | Freq: Once | INTRAMUSCULAR | Status: AC
  Administered 2016-08-01: 0.5 mL via INTRAMUSCULAR
  Filled 2016-08-01: qty 0.5

## 2016-08-01 NOTE — Discharge Instructions (Signed)
Please follow-up with your primary care physician as needed and return to the emergency department for any concerns.  It was a pleasure to take care of you today, and thank you for coming to our emergency department.  If you have any questions or concerns before leaving please ask the nurse to grab me and I'm more than happy to go through your aftercare instructions again.  If you were prescribed any opioid pain medication today such as Norco, Vicodin, Percocet, morphine, hydrocodone, or oxycodone please make sure you do not drive when you are taking this medication as it can alter your ability to drive safely.  If you have any concerns once you are home that you are not improving or are in fact getting worse before you can make it to your follow-up appointment, please do not hesitate to call 911 and come back for further evaluation.  Merrily BrittleNeil Kolina Kube MD  Results for orders placed or performed during the hospital encounter of 03/16/16  C difficile quick scan w PCR reflex  Result Value Ref Range   C Diff antigen POSITIVE (A) NEGATIVE   C Diff toxin POSITIVE (A) NEGATIVE   C Diff interpretation Toxin producing C. difficile detected.    Ct Head Wo Contrast  Result Date: 08/01/2016 CLINICAL DATA:  Larey SeatFell out of a wheelchair and struck his head, laceration LEFT cheekbone, history dementia, diabetes mellitus, hypertension, CHF EXAM: CT HEAD WITHOUT CONTRAST TECHNIQUE: Contiguous axial images were obtained from the base of the skull through the vertex without intravenous contrast. Sagittal and coronal MPR images reconstructed from axial data set. COMPARISON:  02/25/2016 FINDINGS: Brain: Generalized atrophy. Normal ventricular morphology. No midline shift or mass effect. Small vessel chronic ischemic changes of deep cerebral white matter. Small old RIGHT parietal and high RIGHT frontal infarcts. Small old LEFT cerebellar infarct. Old lacunar infarct RIGHT basal ganglia. No intracranial hemorrhage, mass  lesion, evidence of acute infarction, or extra-axial fluid collection. Vascular: Atherosclerotic calcification of internal carotid and vertebral arteries at skullbase Skull: Intact Sinuses/Orbits: Clear Other: N/A IMPRESSION: Atrophy with small vessel chronic ischemic changes of deep cerebral white matter and multiple old infarcts. No definite acute intracranial abnormalities. Electronically Signed   By: Ulyses SouthwardMark  Boles M.D.   On: 08/01/2016 18:15

## 2016-08-01 NOTE — ED Notes (Signed)
[facility] Nolberto HanlonMarilyn Williams [phone]) who states that pt is a ward of the states, [place] by [facility] DSS. Per DSS, pt is not able to return to group home with her daughter. Daughter is also unable to assist pt in transferring from bed to chair, etc. States to expect a call from DSS with further information.

## 2016-08-01 NOTE — ED Notes (Signed)
ED Provider at bedside. 

## 2016-08-01 NOTE — ED Notes (Signed)
Patient transported to CT 

## 2016-08-01 NOTE — ED Notes (Signed)
Family at bedside. 

## 2016-08-01 NOTE — ED Notes (Signed)
[facility] [place] [facility] Marthann Schillerina Reeves (cell: [phone]). DSS state that pt may not leave ED with either son or daughter and must be picked up by group home.

## 2016-08-01 NOTE — ED Triage Notes (Signed)
Pt presents to ED via [facility] c/o fall. Pt stays at group home who told EMS pt fell out of wheelchair and hit head. Takes 81MG  ASA. Small lac noted to pt's L cheekbone. Pt denies pain at this time. Has hx dementia and is confused at baseline.

## 2016-08-01 NOTE — ED Provider Notes (Signed)
[facility] Emergency Department Provider Note  ____________________________________________   First MD Initiated Contact with Patient 08/01/16 1705     (approximate)  I have reviewed the triage vital signs and the nursing notes.   HISTORY  Chief Complaint Fall  Level V exemption history Limited by the patient's dementia  HPI Jamie Glass is a 83 y.o. female who comes to the emergency department via EMS after she had a fall out of her wheelchair today sustaining an injury to the left side of her face. According to the patient's caretaker the patient leaned forward and fell forward. She is currently asymptomatic with no pain. Unknown last tetanus. She denies headache chest pain shortness of breath abdominal pain nausea or vomiting. She denies palpitations. She denies passing out.   Past Medical History:  Diagnosis Date  . Anxiety   . Arthritis   . CHF (congestive heart failure) (HCC)   . Chronic kidney disease   . Dementia   . Diabetes mellitus without complication (HCC)   . Hypertension   . Stress headaches 08/21/2014    Patient Active Problem List   Diagnosis Date Noted  . Sepsis (HCC) 02/25/2016  . Pressure injury of skin 02/25/2016  . Pressure ulcer 11/03/2014  . Protein-calorie malnutrition, severe (HCC) 11/02/2014  . Acute renal failure (HCC) 10/31/2014  . Hyperkalemia 10/31/2014    History reviewed. No pertinent surgical history.  Prior to Admission medications   Medication Sig Start Date End Date Taking? Authorizing Provider  amLODipine (NORVASC) 10 MG tablet Take 1 tablet (10 mg total) by mouth daily. 02/28/16   Adrian Saran, MD  aspirin EC 81 MG tablet Take 81 mg by mouth daily.    [provider]  furosemide (LASIX) 20 MG tablet Take 20 mg by mouth daily.    [provider]  gabapentin (NEURONTIN) 100 MG capsule Take 100 mg by mouth 2 (two) times daily.     [provider]  isosorbide mononitrate  (IMDUR) 60 MG 24 hr tablet Take 1 tablet (60 mg total) by mouth daily. 02/28/16   Adrian Saran, MD  metoprolol tartrate (LOPRESSOR) 25 MG tablet Take 25 mg by mouth 2 (two) times daily.    [provider]  OLANZapine zydis (ZYPREXA) 5 MG disintegrating tablet Take 5 mg by mouth at bedtime.    [provider]  pravastatin (PRAVACHOL) 20 MG tablet Take 20 mg by mouth daily.    [provider]    Allergies Patient has no known allergies.  Family History  Problem Relation Age of Onset  . Hypertension Other     Social History Social History  Substance Use Topics  . Smoking status: Current Every Day Smoker    Packs/day: 0.50    Years: 84.00    Types: Cigarettes  . Smokeless tobacco: Never Used  . Alcohol use No    Review of Systems Level V exemption history Limited by the patient's dementia ____________________________________________   PHYSICAL EXAM:  VITAL SIGNS: ED Triage Vitals  Enc Vitals Group     BP      Pulse      Resp      Temp      Temp src      SpO2      Weight      Height      Head Circumference      [facility] Flow      Pain Score      Pain Loc  Pain Edu?      Excl. in GC?     Constitutional:Pleasant cooperative no acute distress Eyes: PERRL EOMI. Head: Minuscule abrasion to the left cheek no runny tenderness. Nose: No congestion/rhinnorhea. Mouth/Throat: No trismus Neck: No stridor.  No midline tenderness Cardiovascular: Normal rate, regular rhythm. Grossly normal heart sounds.  Good peripheral circulation. Respiratory: Normal respiratory effort.  No retractions. Lungs CTAB and moving good air Gastrointestinal: Soft nontender Musculoskeletal: No lower extremity edema   Neurologic:  Normal speech and language. No gross focal neurologic deficits are appreciated. Skin:  Skin is warm, dry and intact. No rash noted. Psychiatric: Pleasant significant dementia    ____________________________________________     ________________________________________   LABS (all labs ordered are listed, but only abnormal results are displayed)  Labs Reviewed - No data to display   __________________________________________  EKG  ED ECG REPORT I, Merrily BrittleNeil Alva Broxson, the attending physician, personally viewed and interpreted this ECG.  Date: 08/01/2016 Rate: 72 Rhythm: normal sinus rhythm QRS Axis: normal Intervals: normal ST/T Wave abnormalities: normal Conduction Disturbances: none Narrative Interpretation: unremarkable  ____________________________________________  RADIOLOGY  Head CT with no acute disease ____________________________________________   PROCEDURES  Procedure(s) performed: no  Procedures  Critical Care performed: no  Observation: no ____________________________________________   INITIAL IMPRESSION / ASSESSMENT AND PLAN / ED COURSE  Pertinent labs & imaging results that were available during my care of the patient were reviewed by me and considered in my medical decision making (see chart for details).  The patient has minimal trauma after a fall out of a wheelchair. Doubt syncope. EKG unremarkable head CT is normal. Tetanus is updated and she is discharged home in the custody of her caretaker.      ____________________________________________   FINAL CLINICAL IMPRESSION(S) / ED DIAGNOSES  Final diagnoses:  Fall, initial encounter  Facial laceration, initial encounter      NEW MEDICATIONS STARTED DURING THIS VISIT:  New Prescriptions   No medications on file     Note:  This document was prepared using Dragon voice recognition software and may include unintentional dictation errors.     Merrily Brittleifenbark, Yulonda Wheeling, MD 08/01/16 [phone]

## 2017-01-05 ENCOUNTER — Emergency Department: Payer: Medicare Other

## 2017-01-05 ENCOUNTER — Emergency Department
Admission: EM | Admit: 2017-01-05 | Discharge: 2017-01-05 | Disposition: A | Discharge status: Home / Self Care | Payer: Medicare Other | Attending: Emergency Medicine | Admitting: Emergency Medicine

## 2017-01-05 DIAGNOSIS — E1122 Type 2 diabetes mellitus with diabetic chronic kidney disease: Secondary | ICD-10-CM | POA: Diagnosis not present

## 2017-01-05 DIAGNOSIS — E860 Dehydration: Secondary | ICD-10-CM | POA: Insufficient documentation

## 2017-01-05 DIAGNOSIS — R4182 Altered mental status, unspecified: Secondary | ICD-10-CM | POA: Insufficient documentation

## 2017-01-05 DIAGNOSIS — I130 Hypertensive heart and chronic kidney disease with heart failure and stage 1 through stage 4 chronic kidney disease, or unspecified chronic kidney disease: Secondary | ICD-10-CM | POA: Insufficient documentation

## 2017-01-05 DIAGNOSIS — F0390 Unspecified dementia without behavioral disturbance: Secondary | ICD-10-CM | POA: Diagnosis not present

## 2017-01-05 DIAGNOSIS — F17210 Nicotine dependence, cigarettes, uncomplicated: Secondary | ICD-10-CM | POA: Diagnosis not present

## 2017-01-05 DIAGNOSIS — N189 Chronic kidney disease, unspecified: Secondary | ICD-10-CM | POA: Insufficient documentation

## 2017-01-05 DIAGNOSIS — I509 Heart failure, unspecified: Secondary | ICD-10-CM | POA: Insufficient documentation

## 2017-01-05 LAB — COMPREHENSIVE METABOLIC PANEL WITH GFR
ALT: 12 U/L — ABNORMAL LOW (ref 14–54)
AST: 23 U/L (ref 15–41)
Albumin: 4.1 g/dL (ref 3.5–5.0)
Alkaline Phosphatase: 66 U/L (ref 38–126)
Anion gap: 8 (ref 5–15)
BUN: 28 mg/dL — ABNORMAL HIGH (ref 6–20)
CO2: 27 mmol/L (ref 22–32)
Calcium: 9.5 mg/dL (ref 8.9–10.3)
Chloride: 113 mmol/L — ABNORMAL HIGH (ref 101–111)
Creatinine, Ser: 1.08 mg/dL — ABNORMAL HIGH (ref 0.44–1.00)
GFR calc Af Amer: 52 mL/min — ABNORMAL LOW
GFR calc non Af Amer: 45 mL/min — ABNORMAL LOW
Glucose, Bld: 121 mg/dL — ABNORMAL HIGH (ref 65–99)
Potassium: 4.1 mmol/L (ref 3.5–5.1)
Sodium: 148 mmol/L — ABNORMAL HIGH (ref 135–145)
Total Bilirubin: 0.7 mg/dL (ref 0.3–1.2)
Total Protein: 8.1 g/dL (ref 6.5–8.1)

## 2017-01-05 LAB — URINALYSIS, COMPLETE (UACMP) WITH MICROSCOPIC
Bacteria, UA: NONE SEEN
Bilirubin Urine: NEGATIVE
Glucose, UA: NEGATIVE mg/dL
Hgb urine dipstick: NEGATIVE
Ketones, ur: NEGATIVE mg/dL
Leukocytes, UA: NEGATIVE
Nitrite: NEGATIVE
Protein, ur: 30 mg/dL — AB
Specific Gravity, Urine: 1.016 (ref 1.005–1.030)
pH: 5 (ref 5.0–8.0)

## 2017-01-05 LAB — CBC WITH DIFFERENTIAL/PLATELET
Basophils Absolute: 0 10*3/uL (ref 0–0.1)
Basophils Relative: 1 %
Eosinophils Absolute: 0 10*3/uL (ref 0–0.7)
Eosinophils Relative: 1 %
HCT: 38.2 % (ref 35.0–47.0)
Hemoglobin: 12.4 g/dL (ref 12.0–16.0)
Lymphocytes Relative: 28 %
Lymphs Abs: 1.3 10*3/uL (ref 1.0–3.6)
MCH: 26.6 pg (ref 26.0–34.0)
MCHC: 32.3 g/dL (ref 32.0–36.0)
MCV: 82.3 fL (ref 80.0–100.0)
Monocytes Absolute: 0.4 10*3/uL (ref 0.2–0.9)
Monocytes Relative: 9 %
Neutro Abs: 2.9 10*3/uL (ref 1.4–6.5)
Neutrophils Relative %: 61 %
Platelets: 204 10*3/uL (ref 150–440)
RBC: 4.64 MIL/uL (ref 3.80–5.20)
RDW: 16 % — ABNORMAL HIGH (ref 11.5–14.5)
WBC: 4.7 10*3/uL (ref 3.6–11.0)

## 2017-01-05 LAB — TROPONIN I: Troponin I: <0.03 ng/mL

## 2017-01-05 MED ORDER — KCL IN DEXTROSE-NACL 20-5-0.45 MEQ/L-%-% IV SOLN
Freq: Once | INTRAVENOUS | Status: DC
Start: 2017-01-05 — End: 2017-01-05

## 2017-01-05 MED ORDER — DEXTROSE-NACL 5-0.45 % IV SOLN
INTRAVENOUS | Status: DC
  Administered 2017-01-05: 20:00:00 via INTRAVENOUS

## 2017-01-05 NOTE — ED Notes (Signed)
Infusion stopped at 500ml per Dr. Mayford KnifeWilliams for discharge.

## 2017-01-05 NOTE — ED Notes (Signed)
EMS called for transport.

## 2017-01-05 NOTE — ED Notes (Signed)
Patient was incontinent of urine and cleaned and dry diaper placed. Wet clothes were sent home with the patient. Patient left via [facility].

## 2017-01-05 NOTE — ED Provider Notes (Signed)
[facility] Emergency Department Provider Note       Time seen: ----------------------------------------- 5:33 PM on 01/05/2017 -----------------------------------------  Level V caveat: History/ROS limited by dementia   I have reviewed the triage vital signs and the nursing notes.   HISTORY   Chief Complaint Altered Mental Status    HPI Jamie Glass is a 83 y.o. female with a history of dementia who presents to the ED for possible left arm pain.  Patient denies any arm pain but was reportedly complaining of it earlier.  She has a history of dementia.  There was some concerns about facial drooping but is unclear if this is your old.  Again patient denies any complaints.  Past Medical History:  Diagnosis Date  . Anxiety   . Arthritis   . CHF (congestive heart failure) (HCC)   . Chronic kidney disease   . Dementia   . Diabetes mellitus without complication (HCC)   . Hypertension   . Stress headaches 08/21/2014    Patient Active Problem List   Diagnosis Date Noted  . Sepsis (HCC) 02/25/2016  . Pressure injury of skin 02/25/2016  . Pressure ulcer 11/03/2014  . Protein-calorie malnutrition, severe (HCC) 11/02/2014  . Acute renal failure (HCC) 10/31/2014  . Hyperkalemia 10/31/2014    No past surgical history on file.  Allergies Patient has no known allergies.  Social History Social History  Substance Use Topics  . Smoking status: Current Every Day Smoker    Packs/day: 0.50    Years: 84.00    Types: Cigarettes  . Smokeless tobacco: Never Used  . Alcohol use No    Review of Systems Unknown, patient denies complaints All systems negative/normal/unremarkable except as stated in the HPI  ____________________________________________   PHYSICAL EXAM:  VITAL SIGNS: ED Triage Vitals [01/05/17 1721]  Enc Vitals Group     BP      Pulse      Resp      Temp      Temp src      SpO2 97 %     Weight      Height      Head  Circumference      [facility] Flow      Pain Score      Pain Loc      Pain Edu?      Excl. in GC?     Constitutional: Alert but disoriented, well-appearing, no distress Eyes: Conjunctivae are normal. Normal extraocular movements. ENT   Head: Normocephalic and atraumatic.   Nose: No congestion/rhinnorhea.   Mouth/Throat: Mucous membranes are moist.   Neck: No stridor. Cardiovascular: Normal rate, regular rhythm. No murmurs, rubs, or gallops. Respiratory: Normal respiratory effort without tachypnea nor retractions. Breath sounds are clear and equal bilaterally. No wheezes/rales/rhonchi. Gastrointestinal: Soft and nontender. Normal bowel sounds Musculoskeletal: Nontender with normal range of motion in extremities. No lower extremity tenderness nor edema. Neurologic:  Normal speech and language. No gross focal neurologic deficits are appreciated.  No identifiable facial droop is noted Skin:  Skin is warm, dry and intact. No rash noted. Psychiatric: Mood and affect are normal.  ____________________________________________  EKG: Interpreted by me.  Sinus rhythm rate 80 bpm, normal PR interval, normal QRS, normal QT  ____________________________________________  ED COURSE:  Pertinent labs & imaging results that were available during my care of the patient were reviewed by me and considered in my medical decision making (see chart for details). Patient presents for possible change in mental status or arm  pain, we will assess with labs and imaging as indicated. Clinical Course as of Jan 06 2000  Wed Jan 05, 2017  2130 Patient has around a 1 L free water deficit  [JW]    Clinical Course User Index [JW] Emily Filbert, MD   Procedures ____________________________________________   LABS (pertinent positives/negatives)  Labs Reviewed  CBC WITH DIFFERENTIAL/PLATELET - Abnormal; Notable for the following:       Result Value   RDW 16.0 (*)    All other components within  normal limits  COMPREHENSIVE METABOLIC PANEL - Abnormal; Notable for the following:    Sodium 148 (*)    Chloride 113 (*)    Glucose, Bld 121 (*)    BUN 28 (*)    Creatinine, Ser 1.08 (*)    ALT 12 (*)    GFR calc non Af Amer 45 (*)    GFR calc Af Amer 52 (*)    All other components within normal limits  URINALYSIS, COMPLETE (UACMP) WITH MICROSCOPIC - Abnormal; Notable for the following:    Color, Urine YELLOW (*)    APPearance CLEAR (*)    Protein, ur 30 (*)    Squamous Epithelial / LPF 0-5 (*)    All other components within normal limits  TROPONIN I    RADIOLOGY Images were viewed by me  CT head   IMPRESSION: 1. No acute intracranial findings. 2. Scattered small lacunar infarcts along with some right sided watershed infarcts, chronic and stable. 3. Atherosclerosis. 4. Periventricular white matter and corona radiata hypodensities favor chronic ischemic microvascular white matter disease. ____________________________________________  DIFFERENTIAL DIAGNOSIS   Dementia, electrolyte abnormality, dehydration, CVA, MI  FINAL ASSESSMENT AND PLAN  Dementia, dehydration   Plan: Patient had presented for possible altered mental status. Patients labs did indicate some dehydration with her free water deficit. Patients imaging revealed old lacunar infarcts but nothing acute.  She was given fluids here while in the ER and overall appears stable for outpatient follow-up with her doctor.   Emily Filbert, MD   Note: This note was generated in part or whole with voice recognition software. Voice recognition is usually quite accurate but there are transcription errors that can and very often do occur. I apologize for any typographical errors that were not detected and corrected.     Emily Filbert, MD 01/05/17 2002

## 2017-01-05 NOTE — ED Notes (Signed)
[facility] given to [facility].

## 2017-01-05 NOTE — ED Triage Notes (Signed)
Pt arrived via [facility]. EMS reports they were called out for left arm pain, but pt has stated no arm pain. Ems reports pt has some hx of dementia. Upon assessment pr states no pain, but is leaning to the right and has some right sided facial drooping. No other obvious distress noted.

## 2017-01-05 NOTE — ED Notes (Signed)
[facility] was called to transport patient back home.

## 2017-01-05 NOTE — ED Notes (Signed)
This RN called pt social worker Marcelle OverlieRhesa Carr at [phone] who stated that [facility] informing her that pt was coming to ED. She stated that we could treat pt at this time.

## 2017-01-05 NOTE — ED Notes (Signed)
Daughter at bedside states that when she went to visit pt today at 2:30 she noticed pt was leaning toward R, with R facial droop, and c/o of L arm pain. Pt denies pain at this time. Pt remains leaning toward R with R facial droop. Able to follow most commands. Alert to person. Answers yes or no questions.

## 2017-06-18 IMAGING — CR DG CHEST 2V
1 series · 2 of 2 positions shown · non-contrast
Comparison: None.

CLINICAL DATA: Evaluate for pneumonia.  Smoker.

EXAM:
CHEST  2 VIEW

[Series 1: x chest ap · 0.14mm/px · 2 of 2 slices shown]
[im 1/2]
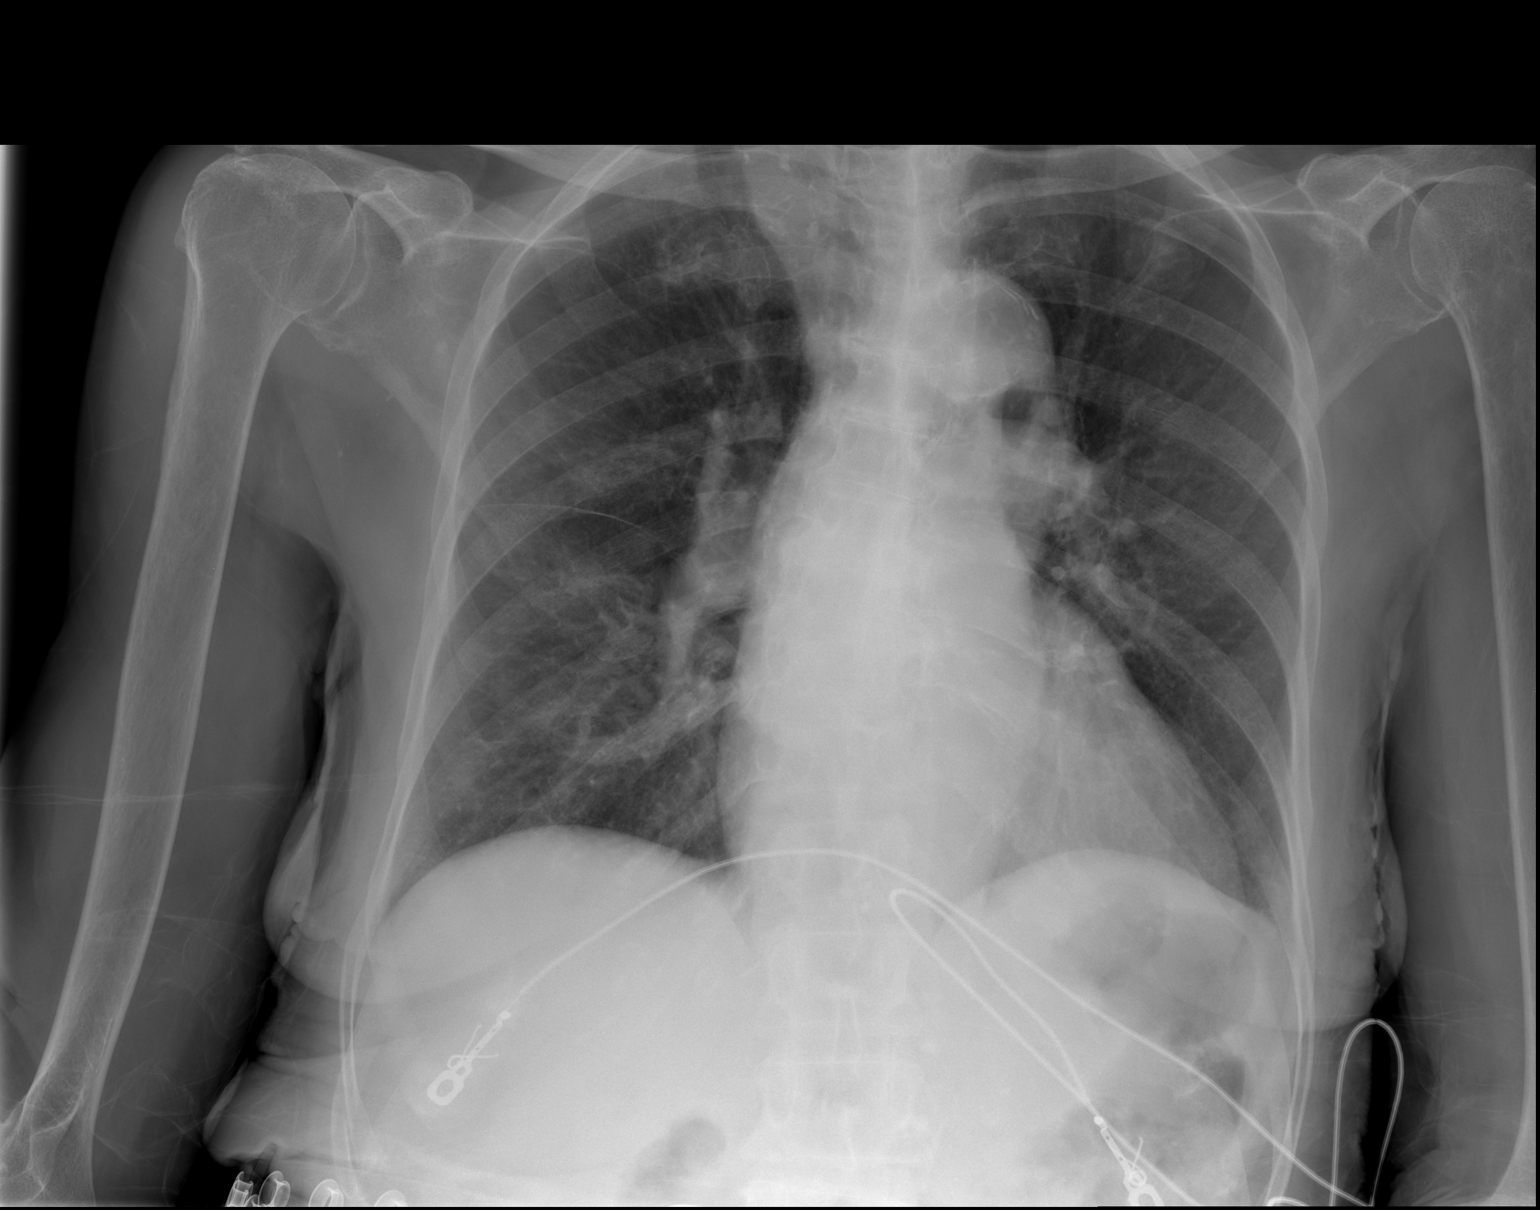
[im 2/2]
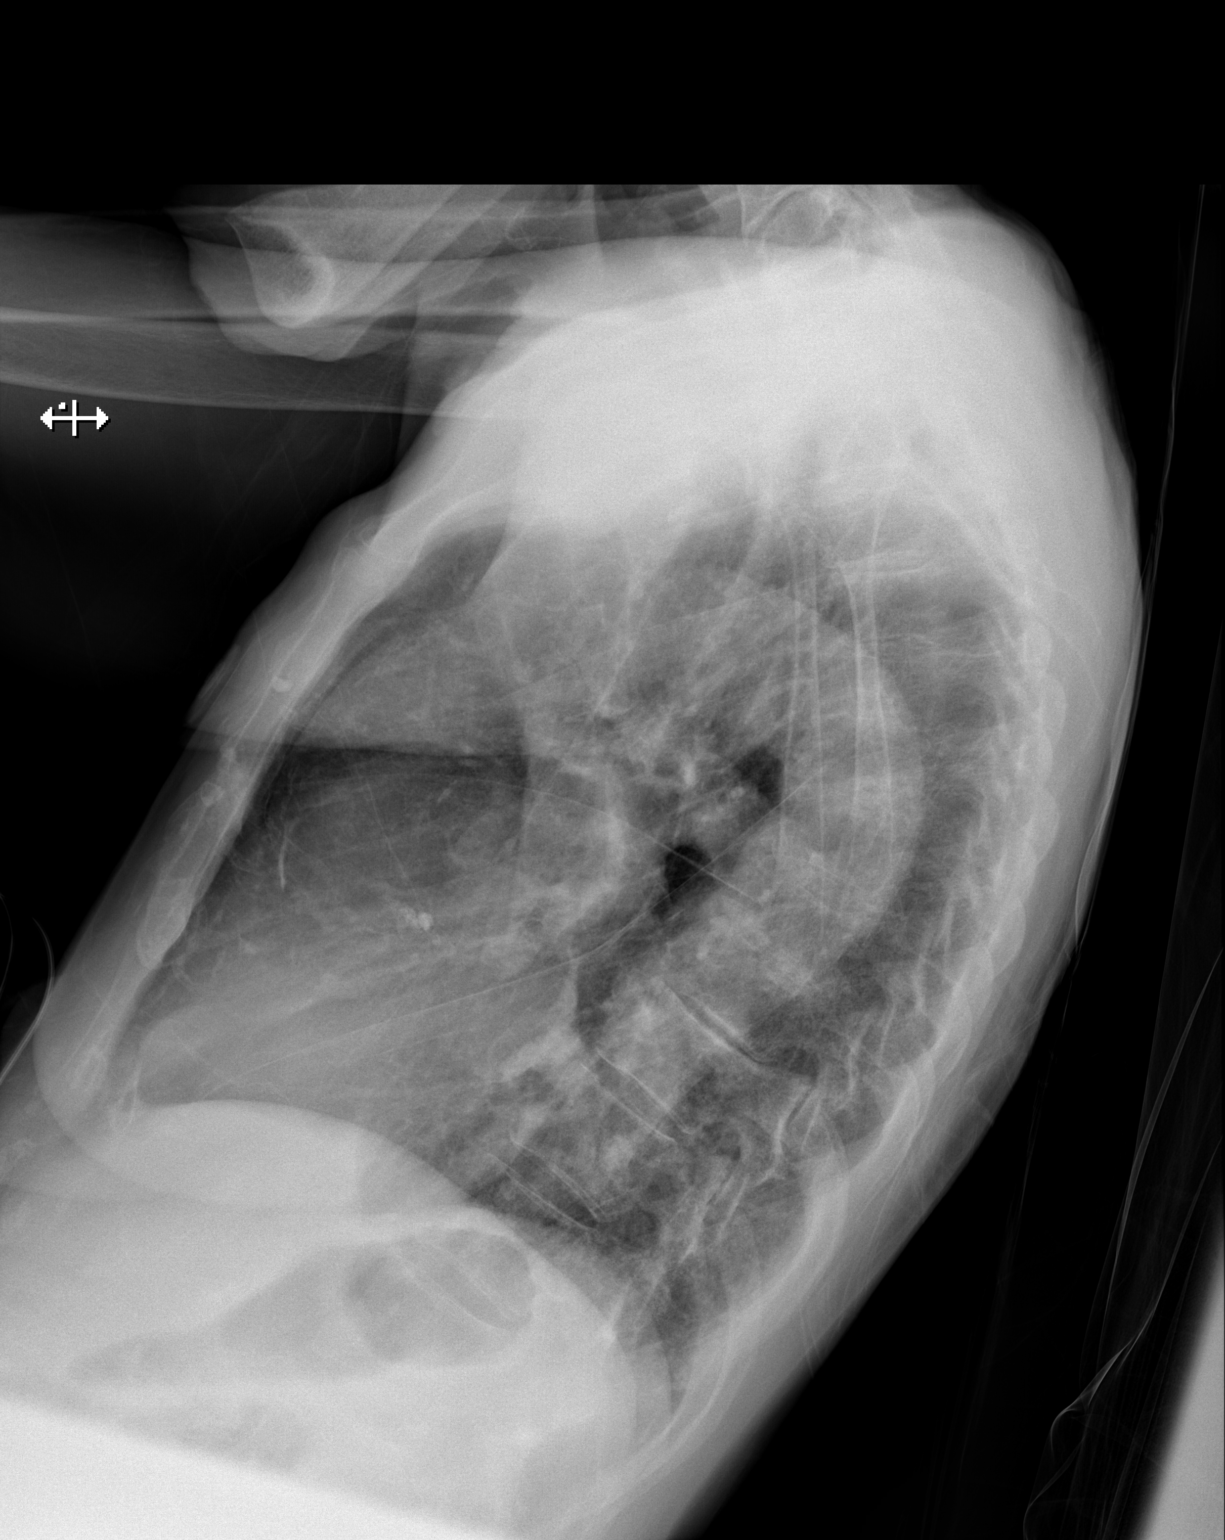

[2 of 2 positions shown; findings below may reference images not displayed]

FINDINGS: The heart size and mediastinal contours are within normal limits.
Aortic atherosclerosis identified. Both lungs are clear. The
visualized skeletal structures are unremarkable.
IMPRESSION: No evidence for pneumonia.

## 2017-06-20 ENCOUNTER — Emergency Department
Admission: EM | Admit: 2017-06-20 | Discharge: 2017-06-20 | Disposition: A | Discharge status: Home / Self Care | Payer: Medicare Other | Attending: Emergency Medicine | Admitting: Emergency Medicine

## 2017-06-20 ENCOUNTER — Emergency Department: Payer: Medicare Other

## 2017-06-20 ENCOUNTER — Other Ambulatory Visit: Payer: Self-pay

## 2017-06-20 DIAGNOSIS — L89309 Pressure ulcer of unspecified buttock, unspecified stage: Secondary | ICD-10-CM | POA: Diagnosis present

## 2017-06-20 DIAGNOSIS — Z7982 Long term (current) use of aspirin: Secondary | ICD-10-CM | POA: Diagnosis not present

## 2017-06-20 DIAGNOSIS — Z79899 Other long term (current) drug therapy: Secondary | ICD-10-CM | POA: Diagnosis not present

## 2017-06-20 DIAGNOSIS — N189 Chronic kidney disease, unspecified: Secondary | ICD-10-CM | POA: Insufficient documentation

## 2017-06-20 DIAGNOSIS — F0390 Unspecified dementia without behavioral disturbance: Secondary | ICD-10-CM | POA: Insufficient documentation

## 2017-06-20 DIAGNOSIS — L8940 Pressure ulcer of contiguous site of back, buttock and hip, unspecified stage: Secondary | ICD-10-CM | POA: Diagnosis not present

## 2017-06-20 DIAGNOSIS — R509 Fever, unspecified: Secondary | ICD-10-CM | POA: Diagnosis not present

## 2017-06-20 DIAGNOSIS — I130 Hypertensive heart and chronic kidney disease with heart failure and stage 1 through stage 4 chronic kidney disease, or unspecified chronic kidney disease: Secondary | ICD-10-CM | POA: Diagnosis not present

## 2017-06-20 DIAGNOSIS — I509 Heart failure, unspecified: Secondary | ICD-10-CM | POA: Insufficient documentation

## 2017-06-20 DIAGNOSIS — E1122 Type 2 diabetes mellitus with diabetic chronic kidney disease: Secondary | ICD-10-CM | POA: Diagnosis not present

## 2017-06-20 DIAGNOSIS — F17210 Nicotine dependence, cigarettes, uncomplicated: Secondary | ICD-10-CM | POA: Insufficient documentation

## 2017-06-20 LAB — BASIC METABOLIC PANEL
Anion gap: 9 (ref 5–15)
BUN: 22 mg/dL — ABNORMAL HIGH (ref 6–20)
CHLORIDE: 103 mmol/L (ref 101–111)
CO2: 25 mmol/L (ref 22–32)
CREATININE: 0.74 mg/dL (ref 0.44–1.00)
Calcium: 9.1 mg/dL (ref 8.9–10.3)
Glucose, Bld: 87 mg/dL (ref 65–99)
Potassium: 4.9 mmol/L (ref 3.5–5.1)
SODIUM: 137 mmol/L (ref 135–145)

## 2017-06-20 LAB — CBC WITH DIFFERENTIAL/PLATELET
BASOS ABS: 0 10*3/uL (ref 0–0.1)
BASOS PCT: 1 %
EOS ABS: 0 10*3/uL (ref 0–0.7)
Eosinophils Relative: 1 %
HCT: 42.8 % (ref 35.0–47.0)
HEMOGLOBIN: 13.9 g/dL (ref 12.0–16.0)
Lymphocytes Relative: 23 %
Lymphs Abs: 1.1 10*3/uL (ref 1.0–3.6)
MCH: 25.7 pg — ABNORMAL LOW (ref 26.0–34.0)
MCHC: 32.4 g/dL (ref 32.0–36.0)
MCV: 79.4 fL — ABNORMAL LOW (ref 80.0–100.0)
Monocytes Absolute: 0.4 10*3/uL (ref 0.2–0.9)
Monocytes Relative: 8 %
NEUTROS PCT: 67 %
Neutro Abs: 3.2 10*3/uL (ref 1.4–6.5)
Platelets: 246 10*3/uL (ref 150–440)
RBC: 5.39 MIL/uL — ABNORMAL HIGH (ref 3.80–5.20)
RDW: 16.8 % — ABNORMAL HIGH (ref 11.5–14.5)
WBC: 4.7 10*3/uL (ref 3.6–11.0)

## 2017-06-20 LAB — URINALYSIS, COMPLETE (UACMP) WITH MICROSCOPIC
BILIRUBIN URINE: NEGATIVE
GLUCOSE, UA: NEGATIVE mg/dL
Hgb urine dipstick: NEGATIVE
KETONES UR: NEGATIVE mg/dL
Leukocytes, UA: NEGATIVE
NITRITE: NEGATIVE
PH: 7 (ref 5.0–8.0)
Protein, ur: 30 mg/dL — AB
Specific Gravity, Urine: 1.012 (ref 1.005–1.030)

## 2017-06-20 NOTE — ED Notes (Signed)
Ulcers viewed by myself and MD Siadecki. Bandages intact, but no drainage noted.

## 2017-06-20 NOTE — ED Notes (Addendum)
Spoke with N[facility]Marilyn [facility] and updated her on pt's plan of care and status to be d/c and brought back to facility via [facility]. [phone]. Also notified Jola BabinskiMarilyn of attempt to contact legal guardian and unsuccessful. Per Jola BabinskiMarilyn she would also try contact.

## 2017-06-20 NOTE — ED Notes (Addendum)
Called legal guardian number in chart. Left message on voicemail and notified of pt's plan of care and status currently at the ED and plans for d/c.

## 2017-06-20 NOTE — Discharge Instructions (Addendum)
[name] was evaluated in the emergency department.  Her decubitus ulcers were evaluated, and there is no evidence of infection or other acute complication.  She should continue to get routine wound care to promote healing.  We also performed a workup to evaluate for any possible sources of fever.  [name] had labs which were unremarkable, a chest x-ray with no evidence of pneumonia, and a urinalysis with no signs of infection.  She is medically cleared for discharge back to her facility.  [name] should be returned to the emergency department for any new or worsening symptoms that are concerning to facility staff.

## 2017-06-20 NOTE — ED Provider Notes (Signed)
[facility] Emergency Department Provider Note ____________________________________________   First MD Initiated Contact with Patient 06/20/17 1834     (approximate)  I have reviewed the triage vital signs and the nursing notes.   HISTORY  Chief Complaint decubitus ulcer  Level 5 caveat: History of present illness limited due to dementia  HPI Jamie Glass is a 84 y.o. female with PMH as noted below including dementia who presents for evaluation of decubitus ulcers as well as apparent resolved fever earlier today.   Past Medical History:  Diagnosis Date  . Anxiety   . Arthritis   . CHF (congestive heart failure) (HCC)   . Chronic kidney disease   . Dementia   . Diabetes mellitus without complication (HCC)   . Hypertension   . Stress headaches 08/21/2014    Patient Active Problem List   Diagnosis Date Noted  . Sepsis (HCC) 02/25/2016  . Pressure injury of skin 02/25/2016  . Pressure ulcer 11/03/2014  . Protein-calorie malnutrition, severe (HCC) 11/02/2014  . Acute renal failure (HCC) 10/31/2014  . Hyperkalemia 10/31/2014    No past surgical history on file.  Prior to Admission medications   Medication Sig Start Date End Date Taking? Authorizing Provider  amLODipine (NORVASC) 10 MG tablet Take 1 tablet (10 mg total) by mouth daily. 02/28/16  Yes Adrian Saran, MD  aspirin EC 81 MG tablet Take 81 mg by mouth daily.   Yes [provider]  gabapentin (NEURONTIN) 100 MG capsule Take 100 mg by mouth at bedtime.    Yes [provider]  isosorbide mononitrate (IMDUR) 60 MG 24 hr tablet Take 1 tablet (60 mg total) by mouth daily. 02/28/16  Yes Mody, Patricia Pesa, MD  metoprolol tartrate (LOPRESSOR) 25 MG tablet Take 25 mg by mouth 2 (two) times daily.   Yes [provider]  OLANZapine (ZYPREXA) 5 MG tablet Take 5 mg by mouth at bedtime.   Yes [provider]  pravastatin (PRAVACHOL) 20 MG tablet Take 20 mg by mouth daily.    Yes [provider]    Allergies Sulfa antibiotics  Family History  Problem Relation Age of Onset  . Hypertension Other     Social History Social History   Tobacco Use  . Smoking status: Current Every Day Smoker    Packs/day: 0.50    Years: 84.00    Pack years: 42.00    Types: Cigarettes  . Smokeless tobacco: Never Used  Substance Use Topics  . Alcohol use: No  . Drug use: No    Review of Systems Level 5 caveat: Unable to obtain review of systems due to dementia   ____________________________________________   PHYSICAL EXAM:  VITAL SIGNS: ED Triage Vitals  Enc Vitals Group     BP 06/20/17 1752 (!) 162/99     Pulse Rate 06/20/17 1752 89     Resp 06/20/17 1752 18     Temp 06/20/17 1752 98.4 F (36.9 C)     Temp Source 06/20/17 1752 Oral     SpO2 06/20/17 1748 100 %     Weight --      Height --      Head Circumference --      Peak Flow --      Pain Score 06/20/17 1753 0     Pain Loc --      Pain Edu? --      Excl. in GC? --     Constitutional: Alert, confused.  Frail appearing but in no  acute distress. Eyes: Conjunctivae are normal.  EOMI.  PERRLA. Head: Atraumatic. Nose: No congestion/rhinnorhea. Mouth/Throat: Mucous membranes are somewhat dry.   Neck: Normal range of motion.  Cardiovascular: Normal rate, regular rhythm. Grossly normal heart sounds.  Good peripheral circulation. Respiratory: Normal respiratory effort.  No retractions. Lungs CTAB. Gastrointestinal: Soft and nontender. No distention.  Genitourinary: No CVA tenderness. Musculoskeletal: No lower extremity edema.  Extremities warm and well perfused.  Neurologic:  Normal speech and language. No gross focal neurologic deficits are appreciated.  Skin:  Skin is warm and dry. No rash noted.  Decubitus ulcers to bilateral buttock/low back area are superficial with no open wounds, necrosis, or surrounding erythema or induration. Psychiatric: Mood and affect are normal. Speech and  behavior are normal.  ____________________________________________   LABS (all labs ordered are listed, but only abnormal results are displayed)  Labs Reviewed  BASIC METABOLIC PANEL - Abnormal; Notable for the following components:      Result Value   BUN 22 (*)    All other components within normal limits  CBC WITH DIFFERENTIAL/PLATELET - Abnormal; Notable for the following components:   RBC 5.39 (*)    MCV 79.4 (*)    MCH 25.7 (*)    RDW 16.8 (*)    All other components within normal limits  URINALYSIS, COMPLETE (UACMP) WITH MICROSCOPIC - Abnormal; Notable for the following components:   Color, Urine YELLOW (*)    APPearance CLEAR (*)    Protein, ur 30 (*)    Bacteria, UA RARE (*)    Squamous Epithelial / LPF 0-5 (*)    All other components within normal limits   ____________________________________________  EKG   ____________________________________________  RADIOLOGY  CXR: No focal infiltrate  ____________________________________________   PROCEDURES  Procedure(s) performed: No  Procedures  Critical Care performed: No ____________________________________________   INITIAL IMPRESSION / ASSESSMENT AND PLAN / ED COURSE  Pertinent labs & imaging results that were available during my care of the patient were reviewed by me and considered in my medical decision making (see chart for details).  84 year old female with PMH as noted above including dementia presents per EMS for evaluation of decubitus ulcers, as well as for fever earlier which has resolved.  The patient is unable to provide any history for voice any specific complaints.  On exam, she is frail but comfortable appearing, neuro exam is nonfocal, there is no evidence of trauma.  Decubitus ulcers appear to be healing well, with no evidence of cellulitis or other complication.  In terms of the decubitus ulcers, they appear uncomplicated and there is no indication for acute intervention except for  routine wound care.  The patient's history and exam also does not suggest other source of infection to explain the earlier fever, and the patient is afebrile currently.  Will obtain basic labs, chest x-ray, UA, and reassess.  Anticipate discharge back to her facility if workup is negative.  Clinical Course as of Jun 20 2053  Mon Jun 20, 2017  1944 Hemoglobin: 13.9 [SS]    Clinical Course User Index [SS] Dionne Bucy, MD   ----------------------------------------- 8:54 PM on 06/20/2017 -----------------------------------------  Lab workup is unremarkable.  Patient's chest x-ray shows no evidence of pneumonia, and her UA is not consistent with UTI.  At this time there is no indication for admission, or further ED observation or workup.  The patient is medically appropriate for discharge back to her facility.  We will contact the facility to arrange for her return.  ____________________________________________  FINAL CLINICAL IMPRESSION(S) / ED DIAGNOSES  Final diagnoses:  Pressure injury of skin of contiguous region involving back and buttock, unspecified injury stage, unspecified laterality      NEW MEDICATIONS STARTED DURING THIS VISIT:  New Prescriptions   No medications on file     Note:  This document was prepared using Dragon voice recognition software and may include unintentional dictation errors.     Dionne BucySiadecki, Dewane Timson, MD 06/20/17 2055

## 2017-06-20 NOTE — ED Notes (Signed)
Pt cleaned up and changed  

## 2017-06-20 NOTE — ED Triage Notes (Signed)
[facility]. EMS reports worsening decubitus ulcers from home health care nurse. No complaints of pain at present moment.

## 2017-06-20 NOTE — ED Notes (Signed)
Pt unable to sign discharge instructions. Spoke with Nolberto HanlonMarilyn Williams regarding discharge instructions. She verbalized discharge instructions

## 2017-07-08 ENCOUNTER — Other Ambulatory Visit
Admission: RE | Admit: 2017-07-08 | Discharge: 2017-07-08 | Disposition: A | Discharge status: Home / Self Care | Payer: Medicare Other | Source: Ambulatory Visit | Attending: Physician Assistant | Admitting: Physician Assistant

## 2017-07-08 ENCOUNTER — Encounter: Payer: Medicare Other | Attending: Physician Assistant | Admitting: Physician Assistant

## 2017-07-08 DIAGNOSIS — E1140 Type 2 diabetes mellitus with diabetic neuropathy, unspecified: Secondary | ICD-10-CM | POA: Insufficient documentation

## 2017-07-08 DIAGNOSIS — B999 Unspecified infectious disease: Secondary | ICD-10-CM | POA: Diagnosis present

## 2017-07-08 DIAGNOSIS — L89894 Pressure ulcer of other site, stage 4: Secondary | ICD-10-CM | POA: Diagnosis not present

## 2017-07-08 DIAGNOSIS — F0390 Unspecified dementia without behavioral disturbance: Secondary | ICD-10-CM | POA: Insufficient documentation

## 2017-07-08 DIAGNOSIS — I10 Essential (primary) hypertension: Secondary | ICD-10-CM | POA: Diagnosis not present

## 2017-07-11 LAB — AEROBIC CULTURE  (SUPERFICIAL SPECIMEN)

## 2017-07-11 LAB — AEROBIC CULTURE W GRAM STAIN (SUPERFICIAL SPECIMEN)

## 2017-07-12 NOTE — Progress Notes (Signed)
Jamie, Glass ([id number]) Visit Report for 07/08/2017 Abuse/Suicide Risk Screen Details Patient Name: Jamie Glass, Jamie Glass Date of Service: 07/08/2017 9:45 AM Medical Record Number: [id number] Patient Account Number: [id number] Date of Birth/Sex: [birth year] (84 y.o. Female) Treating RN: Huel Coventry Primary Care Keena Dinse: Marcelino Duster Other Clinician: Referring Eros Montour: Marcelino Duster Treating Katrisha Segall/Extender: Linwood Dibbles, HOYT Weeks in Treatment: 0 Abuse/Suicide Risk Screen Items Answer Notes Unable to answer. Electronic Signature(s) Signed: 07/08/2017 5:40:02 PM By: Elliot Gurney, BSN, RN, CWS, Kim RN, BSN Entered By: Elliot Gurney, BSN, RN, CWS, Kim on 07/08/2017 10:28:12 BELLARAE, LIZER ([id number]) -------------------------------------------------------------------------------- Activities of Daily Living Details Patient Name: Jamie, Glass Date of Service: 07/08/2017 9:45 AM Medical Record Number: [id number] Patient Account Number: [id number] Date of Birth/Sex: 03-18-1927 (84 y.o. Female) Treating RN: Huel Coventry Primary Care Adrieana Fennelly: Marcelino Duster Other Clinician: Referring Brien Lowe: Marcelino Duster Treating Jemiah Ellenburg/Extender: Linwood Dibbles, HOYT Weeks in Treatment: 0 Activities of Daily Living Items Answer Activities of Daily Living (Please select one for each item) Drive Automobile Not Able Take Medications Not Able Use Telephone Not Able Care for Appearance Not Able Use Toilet Not Able Mady Haagensen / Shower Not Able Dress Self Not Able Feed Self Not Able Walk Not Able Get In / Out Bed Not Able Housework Not Able Prepare Meals Not Able Handle Money Not Able Shop for Self Not Able Notes Per Recruitment consultant) Signed: 07/08/2017 5:40:02 PM By: Elliot Gurney, BSN, RN, CWS, Kim RN, BSN Entered By: Elliot Gurney, BSN, RN, CWS, Kim on 07/08/2017 10:40:34 Ky, Moskowitz Katheren ([id number]) -------------------------------------------------------------------------------- Education Assessment  Details Patient Name: Jamie Glass Date of Service: 07/08/2017 9:45 AM Medical Record Number: [id number] Patient Account Number: [id number] Date of Birth/Sex: 12/04/1927 (84 y.o. Female) Treating RN: Huel Coventry Primary Care Boston Catarino: Marcelino Duster Other Clinician: Referring Nonna Renninger: Marcelino Duster Treating Montana Bryngelson/Extender: Linwood Dibbles, HOYT Weeks in Treatment: 0 Knowledge/Comprehension Assessment Knowledge Level: Low Comprehension Level: Low Ability to understand written Low instructions: Ability to understand verbal Low instructions: Motivation Assessment Anxiety Level: Calm Cooperation: Cooperative Perception: Confused Willingness to Engage in Self- Low Management Activities: Readiness to Engage in Self- Low Management Activities: Notes Patient alone and unable to answer questions Electronic Signature(s) Signed: 07/08/2017 5:40:02 PM By: Elliot Gurney, BSN, RN, CWS, Kim RN, BSN Entered By: Elliot Gurney, BSN, RN, CWS, Kim on 07/08/2017 10:29:01 CESIAH, WESTLEY ([id number]) -------------------------------------------------------------------------------- Fall Risk Assessment Details Patient Name: Jamie Glass Date of Service: 07/08/2017 9:45 AM Medical Record Number: [id number] Patient Account Number: [id number] Date of Birth/Sex: 1928/02/06 (84 y.o. Female) Treating RN: Huel Coventry Primary Care Laranda Burkemper: Marcelino Duster Other Clinician: Referring Enna Warwick: Marcelino Duster Treating Lossie Kalp/Extender: Linwood Dibbles, HOYT Weeks in Treatment: 0 Fall Risk Assessment Items Unable to Perform Assessment: Medical contraindication (finding) Notes Patient alone and unable to answer questions. Electronic Signature(s) Signed: 07/08/2017 5:40:02 PM By: Elliot Gurney, BSN, RN, CWS, Kim RN, BSN Entered By: Elliot Gurney, BSN, RN, CWS, Kim on 07/08/2017 10:29:27 SATIA, WINGER ([id number]) -------------------------------------------------------------------------------- Foot Assessment Details Patient Name: Jamie Glass Date of Service: 07/08/2017 9:45 AM Medical Record Number: [id number] Patient Account Number: [id number] Date of Birth/Sex: 1927-12-28 (84 y.o. Female) Treating RN: Huel Coventry Primary Care Fabiana Dromgoole: Marcelino Duster Other Clinician: Referring Etha Stambaugh: Marcelino Duster Treating Tonica Brasington/Extender: Linwood Dibbles, HOYT Weeks in Treatment: 0 Foot Assessment Items Site Locations + = Sensation present, - = Sensation absent, C = Callus, U = Ulcer R = Redness, W = Warmth, M = Maceration, PU = Pre-ulcerative lesion F = Fissure, S = Swelling, D = Dryness Assessment Right: Left: Other Deformity: No No Prior Foot Ulcer: No No  Prior Amputation: No No Charcot Joint: No No Ambulatory Status: Non-ambulatory Assistance Device: Wheelchair Gait: Electronic Signature(s) SignCamera operatorPM By: Elliot Gurney, BSN, RN, CWS, Kim RN, BSN Entered By: Elliot Gurney, BSN, RN, CWS, Kim on 07/08/2017 10:29:56 Ilyse, Tremain Jaelle ([id number]) -------------------------------------------------------------------------------- Nutrition Risk Assessment Details Patient Name: Jamie Glass Date of Service: 07/08/2017 9:45 AM Medical Record Number: [id number] Patient Account Number: [id number] Date of Birth/Sex: 1928/03/01 (84 y.o. Female) Treating RN: Huel Coventry Primary Care Rahmah Mccamy: Marcelino Duster Other Clinician: Referring Delenn Ahn: Marcelino Duster Treating Kyland No/Extender: Linwood Dibbles, HOYT Weeks in Treatment: 0 Height (in): Weight (lbs): Body Mass Index (BMI): Nutrition Risk Assessment Items NUTRITION RISK SCREEN: I have an illness or condition that made me change the kind and/or amount of 2 Yes food I eat I eat fewer than two meals per day 0 No I eat few fruits and vegetables, or milk products 0 No I have three or more drinks of beer, liquor or wine almost every day 0 No I have tooth or mouth problems that make it hard for me to eat 0 No I don't always have enough money to buy the food I need 0 No I eat alone most of  the time 0 No I take three or more different prescribed or over-the-counter drugs a day 0 No Without wanting to, I have lost or gained 10 pounds in the last six months 2 Yes I am not always physically able to shop, cook and/or feed myself 0 No Nutrition Protocols Good Risk Protocol Moderate Risk Protocol Provide education on High Risk Proctocol 0 Notes Per Recruitment consultant) Signed: 07/08/2017 5:40:02 PM By: Elliot Gurney, BSN, RN, CWS, Kim RN, BSN Entered By: Elliot Gurney, BSN, RN, CWS, Kim on 07/08/2017 10:41:12

## 2017-07-12 NOTE — Progress Notes (Addendum)
SHUNTAE, HERZIG ([id number]) Visit Report for 07/08/2017 Allergy List Details Patient Name: Jamie Glass, Jamie Glass Date of Service: 07/08/2017 9:45 AM Medical Record Number: [id number] Patient Account Number: [id number] Date of Birth/Sex: 10-08-1927 (84 y.o. Female) Treating RN: Huel Coventry Primary Care Gust Eugene: Marcelino Duster Other Clinician: Referring Tynetta Bachmann: Marcelino Duster Treating Jakaylah Schlafer/Extender: Linwood Dibbles, HOYT Weeks in Treatment: 0 Allergies Active Allergies No Known Allergies Allergy Notes Per Guardianship Sport and exercise psychologist) Signed: 07/08/2017 5:40:02 PM By: Elliot Gurney, BSN, RN, CWS, Kim RN, BSN Entered By: Elliot Gurney, BSN, RN, CWS, Kim on 07/08/2017 10:39:09 TONESHA, TSOU ([id number]) -------------------------------------------------------------------------------- Arrival Information Details Patient Name: Jamie Glass Date of Service: 07/08/2017 9:45 AM Medical Record Number: [id number] Patient Account Number: [id number] Date of Birth/Sex: Feb 22, 1928 (84 y.o. Female) Treating RN: Huel Coventry Primary Care Abimbola Aki: Marcelino Duster Other Clinician: Referring Deantae Shackleton: Marcelino Duster Treating Lucy Boardman/Extender: Linwood Dibbles, HOYT Weeks in Treatment: 0 Visit Information Patient Arrived: Wheel Chair Arrival Time: 10:07 Accompanied By: transportation Transfer Assistance: Manual Patient Identification Verified: Yes Secondary Verification Process Completed: Yes Patient Has Alerts: Yes Patient Alerts: Diabetic Electronic Signature(s) Signed: 07/08/2017 5:40:02 PM By: Elliot Gurney, BSN, RN, CWS, Kim RN, BSN Entered By: Elliot Gurney, BSN, RN, CWS, Kim on 07/08/2017 10:38:21 Chiyo, Fay Kymber ([id number]) -------------------------------------------------------------------------------- Clinic Level of Care Assessment Details Patient Name: Jamie Glass Date of Service: 07/08/2017 9:45 AM Medical Record Number: [id number] Patient Account Number: [id number] Date of Birth/Sex: 06-06-1927 (84 y.o.  Female) Treating RN: Curtis Sites Primary Care Hank Walling: Marcelino Duster Other Clinician: Referring Teniola Tseng: Marcelino Duster Treating Bryton Romagnoli/Extender: Linwood Dibbles, HOYT Weeks in Treatment: 0 Clinic Level of Care Assessment Items TOOL 1 Quantity Score  - Use when EandM and Procedure is performed on INITIAL visit 0 ASSESSMENTS - Nursing Assessment / Reassessment X - General Physical Exam (combine w/ comprehensive assessment (listed just below) when 1 20 performed on new pt. evals) X- 1 25 Comprehensive Assessment (HX, ROS, Risk Assessments, Wounds Hx, etc.) ASSESSMENTS - Wound and Skin Assessment / Reassessment X - Dermatologic / Skin Assessment (not related to wound area) 1 10 ASSESSMENTS - Ostomy and/or Continence Assessment and Care  - Incontinence Assessment and Management 0  - 0 Ostomy Care Assessment and Management (repouching, etc.) PROCESS - Coordination of Care X - Simple Patient / Family Education for ongoing care 1 15  - 0 Complex (extensive) Patient / Family Education for ongoing care X- 1 10 Staff obtains Chiropractor, Records, Test Results / Process Orders  - 0 Staff telephones HHA, Nursing Homes / Clarify orders / etc  - 0 Routine Transfer to another Facility (non-emergent condition)  - 0 Routine Hospital Admission (non-emergent condition) X- 1 15 New Admissions / Manufacturing engineer / Ordering NPWT, Apligraf, etc.  - 0 Emergency Hospital Admission (emergent condition) PROCESS - Special Needs  - Pediatric / Minor Patient Management 0  - 0 Isolation Patient Management  - 0 Hearing / Language / Visual special needs  - 0 Assessment of Community assistance (transportation, D/C planning, etc.)  - 0 Additional assistance / Altered mentation  - 0 Support Surface(s) Assessment (bed, cushion, seat, etc.) Duclos, Vannesa ([id number]) INTERVENTIONS - Miscellaneous  - External ear exam 0 X- 1 10 Patient Transfer (multiple staff /  Nurse, adult / Similar devices)  - 0 Simple Staple / Suture removal (25 or less)  - 0 Complex Staple / Suture removal (26 or more)  - 0 Hypo/Hyperglycemic Management (do not check if billed separately)  - 0 Ankle / Brachial Index (ABI) - do not check if billed separately Has the patient  been seen at the hospital within the last three years: Yes Total Score: 105 Level Of Care: New/Established - Level 3 Electronic Signature(s) Signed: 07/08/2017 4:48:08 PM By: Curtis Sites Entered By: Curtis Sites on 07/08/2017 11:15:56 Robison, Bridie ([id number]) -------------------------------------------------------------------------------- Encounter Discharge Information Details Patient Name: Jamie Glass Date of Service: 07/08/2017 9:45 AM Medical Record Number: [id number] Patient Account Number: [id number] Date of Birth/Sex: 1927/12/06 (84 y.o. Female) Treating RN: Curtis Sites Primary Care Nykerria Macconnell: Marcelino Duster Other Clinician: Referring Calli Bashor: Marcelino Duster Treating Daejah Klebba/Extender: Linwood Dibbles, HOYT Weeks in Treatment: 0 Encounter Discharge Information Items Discharge Pain Level: 0 Discharge Condition: Stable Ambulatory Status: Wheelchair Discharge Destination: Nursing Home Transportation: Private Auto Accompanied By: caseworker Schedule Follow-up Appointment: No Medication Reconciliation completed and No provided to Patient/Care Mathhew Buysse: Provided on Clinical Summary of Care: 07/08/2017 Form Type Recipient Paper Patient BF Electronic Signature(s) Signed: 07/08/2017 11:43:01 AM By: Alejandro Mulling Entered By: Alejandro Mulling on 07/08/2017 11:43:00 Repinski, Makinna ([id number]) -------------------------------------------------------------------------------- Lower Extremity Assessment Details Patient Name: Jamie Glass Date of Service: 07/08/2017 9:45 AM Medical Record Number: [id number] Patient Account Number: [id number] Date of Birth/Sex: 10/22/1927 (84 y.o.  Female) Treating RN: Huel Coventry Primary Care Aaralyn Kil: Marcelino Duster Other Clinician: Referring Rubylee Zamarripa: Marcelino Duster Treating Barrington Worley/Extender: Linwood Dibbles, HOYT Weeks in Treatment: 0 Electronic Signature(s) Signed: 07/08/2017 5:40:02 PM By: Elliot Gurney, BSN, RN, CWS, Kim RN, BSN Entered By: Elliot Gurney, BSN, RN, CWS, Kim on 07/08/2017 10:25:01 Justyn, Boyson Lakina ([id number]) -------------------------------------------------------------------------------- Multi Wound Chart Details Patient Name: Jamie Glass Date of Service: 07/08/2017 9:45 AM Medical Record Number: [id number] Patient Account Number: [id number] Date of Birth/Sex: [birth year] (84 y.o. Female) Treating RN: Curtis Sites Primary Care Ebin Palazzi: Marcelino Duster Other Clinician: Referring Kloey Cazarez: Marcelino Duster Treating Jovanie Verge/Extender: Linwood Dibbles, HOYT Weeks in Treatment: 0 Vital Signs Height(in): Pulse(bpm): 81 Weight(lbs): Blood Pressure(mmHg): 155/89 Body Mass Index(BMI): Temperature(F): 97.5 Respiratory Rate 16 (breaths/min): Photos: [1:No Photos] [N/A:N/A] Wound Location: [1:Right Ilium] [N/A:N/A] Wounding Event: [1:Pressure Injury] [N/A:N/A] Primary Etiology: [1:Pressure Ulcer] [N/A:N/A] Date Acquired: [1:04/08/2017] [N/A:N/A] Weeks of Treatment: [1:0] [N/A:N/A] Wound Status: [1:Open] [N/A:N/A] Measurements L x W x D [1:2.5x2x0.8] [N/A:N/A] (cm) Area (cm) : [1:3.927] [N/A:N/A] Volume (cm) : [1:3.142] [N/A:N/A] % Reduction in Area: [1:0.00%] [N/A:N/A] % Reduction in Volume: [1:0.00%] [N/A:N/A] Classification: [1:Unstageable/Unclassified] [N/A:N/A] Exudate Amount: [1:Large] [N/A:N/A] Exudate Type: [1:Serous] [N/A:N/A] Exudate Color: [1:amber] [N/A:N/A] Foul Odor After Cleansing: [1:Yes] [N/A:N/A] Odor Anticipated Due to [1:No] [N/A:N/A] Product Use: Wound Margin: [1:Well defined, not attached] [N/A:N/A] Granulation Amount: [1:None Present (0%)] [N/A:N/A] Necrotic Amount: [1:Large (67-100%)]  [N/A:N/A] Necrotic Tissue: [1:Eschar, Adherent Slough] [N/A:N/A] Epithelialization: [1:None] [N/A:N/A] Periwound Skin Texture: [1:Excoriation: No Induration: No Callus: No Crepitus: No Rash: No Scarring: No] [N/A:N/A] Periwound Skin Moisture: [1:Maceration: No Dry/Scaly: No] [N/A:N/A] Periwound Skin Color: [1:Hemosiderin Staining: Yes Atrophie Blanche: No Cyanosis: No Ecchymosis: No] [N/A:N/A] Erythema: No Mottled: No Pallor: No Rubor: No Tenderness on Palpation: No N/A N/A Wound Preparation: Ulcer Cleansing: N/A N/A Rinsed/Irrigated with Saline Topical Anesthetic Applied: Other: lidocaine 4% Treatment Notes Electronic Signature(s) Signed: 07/08/2017 4:48:08 PM By: Curtis Sites Entered By: Curtis Sites on 07/08/2017 10:54:07 Cerreta, Dortha ([id number]) -------------------------------------------------------------------------------- Multi-Disciplinary Care Plan Details Patient Name: Jamie Glass Date of Service: 07/08/2017 9:45 AM Medical Record Number: [id number] Patient Account Number: [id number] Date of Birth/Sex: [birth year] (84 y.o. Female) Treating RN: Curtis Sites Primary Care Judeen Geralds: Marcelino Duster Other Clinician: Referring Augusta Hilbert: Marcelino Duster Treating Claude Swendsen/Extender: Linwood Dibbles, HOYT Weeks in Treatment: 0 Active Inactive ` Abuse / Safety / Falls / Self Care Management Nursing Diagnoses: Potential for falls Goals: Patient will remain  injury free related to falls Date Initiated: 07/08/2017 Target Resolution Date: 10/08/2017 Goal Status: Active Interventions: Assess fall risk on admission and as needed Notes: ` Nutrition Nursing Diagnoses: Imbalanced nutrition Goals: Patient/caregiver agrees to and verbalizes understanding of need to use nutritional supplements and/or vitamins as prescribed Date Initiated: 07/08/2017 Target Resolution Date: 10/15/2017 Goal Status: Active Interventions: Assess patient nutrition upon admission and as needed per  policy Notes: ` Orientation to the Wound Care Program Nursing Diagnoses: Knowledge deficit related to the wound healing center program Goals: Patient/caregiver will verbalize understanding of the Wound Healing Center Program Date Initiated: 07/08/2017 Target Resolution Date: 10/15/2017 Goal Status: Active Interventions: KIAUNA, ZYWICKI ([id number]) Provide education on orientation to the wound center Notes: ` Pressure Nursing Diagnoses: Potential for impaired tissue integrity related to pressure, friction, moisture, and shear Goals: Patient will remain free from development of additional pressure ulcers Date Initiated: 07/08/2017 Target Resolution Date: 10/15/2017 Goal Status: Active Interventions: Provide education on pressure ulcers Notes: ` Wound/Skin Impairment Nursing Diagnoses: Impaired tissue integrity Goals: Ulcer/skin breakdown will heal within 14 weeks Date Initiated: 07/08/2017 Target Resolution Date: 10/15/2017 Goal Status: Active Interventions: Assess patient/caregiver ability to obtain necessary supplies Assess patient/caregiver ability to perform ulcer/skin care regimen upon admission and as needed Assess ulceration(s) every visit Notes: Electronic Signature(s) Signed: 07/08/2017 4:48:08 PM By: Curtis Sites Entered By: Curtis Sites on 07/08/2017 10:53:53 Larrabee, Varnika ([id number]) -------------------------------------------------------------------------------- Pain Assessment Details Patient Name: Jamie Glass Date of Service: 07/08/2017 9:45 AM Medical Record Number: [id number] Patient Account Number: [id number] Date of Birth/Sex: January 19, 1928 (84 y.o. Female) Treating RN: Huel Coventry Primary Care Kenard Morawski: Marcelino Duster Other Clinician: Referring Prajna Vanderpool: Marcelino Duster Treating Donovyn Guidice/Extender: Linwood Dibbles, HOYT Weeks in Treatment: 0 Active Problems Location of Pain Severity and Description of Pain Patient Has Paino Patient Unable to Respond Site  Locations Pain Management and Medication Current Pain Management: Electronic Signature(s) Signed: 07/08/2017 5:40:02 PM By: Elliot Gurney, BSN, RN, CWS, Kim RN, BSN Entered By: Elliot Gurney, BSN, RN, CWS, Kim on 07/08/2017 10:08:58 TIMMY, CLEVERLY ([id number]) -------------------------------------------------------------------------------- Patient/Caregiver Education Details Patient Name: Jamie Glass Date of Service: 07/08/2017 9:45 AM Medical Record Number: [id number] Patient Account Number: [id number] Date of Birth/Gender: 1927/06/26 (84 y.o. Female) Treating RN: Ashok Cordia, Debi Primary Care Physician: Marcelino Duster Other Clinician: Referring Physician: Marcelino Duster Treating Physician/Extender: Skeet Simmer in Treatment: 0 Education Assessment Education Provided To: Patient Education Topics Provided Wound/Skin Impairment: Handouts: Caring for Your Ulcer, Other: change dressing as ordered Methods: Demonstration, Explain/Verbal Responses: State content correctly Electronic Signature(s) Signed: 07/08/2017 4:40:49 PM By: Alejandro Mulling Entered By: Alejandro Mulling on 07/08/2017 11:43:18 Swayne, Jilda ([id number]) -------------------------------------------------------------------------------- Wound Assessment Details Patient Name: Jamie Glass Date of Service: 07/08/2017 9:45 AM Medical Record Number: [id number] Patient Account Number: [id number] Date of Birth/Sex: 1928-01-29 (84 y.o. Female) Treating RN: Huel Coventry Primary Care Haeven Nickle: Marcelino Duster Other Clinician: Referring Danyell Shader: Marcelino Duster Treating Bobetta Korf/Extender: Linwood Dibbles, HOYT Weeks in Treatment: 0 Wound Status Wound Number: 1 Primary Etiology: Pressure Ulcer Wound Location: Right Ilium Wound Status: Open Wounding Event: Pressure Injury Date Acquired: 04/08/2017 Weeks Of Treatment: 0 Clustered Wound: No Photos Photo Uploaded By: Elliot Gurney, BSN, RN, CWS, Kim on 07/08/2017 16:10:18 Wound Measurements Length:  (cm) 2.5 Width: (cm) 2 Depth: (cm) 0.8 Area: (cm) 3.927 Volume: (cm) 3.142 % Reduction in Area: 0% % Reduction in Volume: 0% Epithelialization: None Wound Description Classification: Unstageable/Unclassified Wound Margin: Well defined, not attached Exudate Amount: Large Exudate Type: Serous Exudate Color: amber Foul Odor After Cleansing: Yes Due to Product Use: No Slough/Fibrino  Yes Wound Bed Granulation Amount: None Present (0%) Necrotic Amount: Large (67-100%) Necrotic Quality: Eschar, Adherent Slough Periwound Skin Texture Texture Color No Abnormalities Noted: No No Abnormalities Noted: No Callus: No Atrophie Blanche: No Crepitus: No Cyanosis: No Branson, Ashtynn ([id number]) Excoriation: No Ecchymosis: No Induration: No Erythema: No Rash: No Hemosiderin Staining: Yes Scarring: No Mottled: No Pallor: No Moisture Rubor: No No Abnormalities Noted: No Dry / Scaly: No Maceration: No Wound Preparation Ulcer Cleansing: Rinsed/Irrigated with Saline Topical Anesthetic Applied: Other: lidocaine 4%, Treatment Notes Wound #1 (Right Ilium) 1. Cleansed with: Clean wound with Normal Saline 2. Anesthetic Topical Lidocaine 4% cream to wound bed prior to debridement 3. Peri-wound Care: Skin Prep 4. Dressing Applied: Other dressing (specify in notes) 5. Secondary Dressing Applied ABD Pad Dry Gauze 7. Secured with Tape Notes xtrasorb, Dakins moistened gauze Electronic Signature(s) Signed: 07/08/2017 5:40:02 PM By: Elliot Gurney, BSN, RN, CWS, Kim RN, BSN Entered By: Elliot Gurney, BSN, RN, CWS, Kim on 07/08/2017 10:24:36 Ofelia, Podolski Ceilidh ([id number]) -------------------------------------------------------------------------------- Vitals Details Patient Name: Jamie Glass Date of Service: 07/08/2017 9:45 AM Medical Record Number: [id number] Patient Account Number: [id number] Date of Birth/Sex: [birth year] (84 y.o. Female) Treating RN: Huel Coventry Primary Care Azarian Starace: Marcelino Duster Other Clinician: Referring Emillia Weatherly: Marcelino Duster Treating Miana Politte/Extender: Linwood Dibbles, HOYT Weeks in Treatment: 0 Vital Signs Time Taken: 10:09 Temperature (F): 97.5 Pulse (bpm): 81 Respiratory Rate (breaths/min): 16 Blood Pressure (mmHg): 155/89 Reference Range: 80 - 120 mg / dl Electronic Signature(s) Signed: 07/08/2017 5:40:02 PM By: Elliot Gurney, BSN, RN, CWS, Kim RN, BSN Entered By: Elliot Gurney, BSN, RN, CWS, Kim on 07/08/2017 10:16:53

## 2017-07-14 NOTE — Progress Notes (Addendum)
RIELEY, KHALSA ([id number]) Visit Report for 07/08/2017 Chief Complaint Document Details Patient Name: Jamie Glass, Jamie Glass Date of Service: 07/08/2017 9:45 AM Medical Record Number: [id number] Patient Account Number: [id number] Date of Birth/Sex: Jan 27, 1928 (84 y.o. Female) Treating RN: Curtis Sites Primary Care Provider: Marcelino Duster Other Clinician: Referring Provider: Marcelino Duster Treating Provider/Extender: Linwood Dibbles, Lilton Pare Weeks in Treatment: 0 Information Obtained from: Patient Chief Complaint Right Illium pressure ulcer Electronic Signature(s) Signed: 07/12/2017 5:56:33 PM By: Lenda Kelp PA-C Entered By: Lenda Kelp on 07/09/2017 20:09:55 KUEKER, Dessire ([id number]) -------------------------------------------------------------------------------- Debridement Details Patient Name: Jamie Glass Date of Service: 07/08/2017 9:45 AM Medical Record Number: [id number] Patient Account Number: [id number] Date of Birth/Sex: Sep 20, 1927 (84 y.o. Female) Treating RN: Curtis Sites Primary Care Provider: Marcelino Duster Other Clinician: Referring Provider: Marcelino Duster Treating Provider/Extender: Linwood Dibbles, Jobany Montellano Weeks in Treatment: 0 Debridement Performed for Wound #1 Right Ilium Assessment: Performed By: Physician STONE III, Twana Wileman E., PA-C Debridement Type: Debridement Pre-procedure Verification/Time Yes - 10:52 Out Taken: Start Time: 10:52 Pain Control: Lidocaine 4% Topical Solution Total Area Debrided (L x W): 2.5 (cm) x 2 (cm) = 5 (cm) Tissue and other material Viable, Non-Viable, Eschar, Slough, Subcutaneous, Slough debrided: Level: Skin/Subcutaneous Tissue Debridement Description: Excisional Instrument: Forceps, Scissors Bleeding: Minimum Hemostasis Achieved: Pressure End Time: 10:57 Procedural Pain: 0 Post Procedural Pain: 0 Response to Treatment: Procedure was tolerated well Post Debridement Measurements of Total Wound Length: (cm) 2.5 Stage: Category/Stage  IV Width: (cm) 2 Depth: (cm) 1.2 Volume: (cm) 4.712 Character of Wound/Ulcer Post Improved Debridement: Post Procedure Diagnosis Same as Pre-procedure Electronic Signature(s) Signed: 07/08/2017 4:48:08 PM By: Curtis Sites Signed: 07/12/2017 5:56:33 PM By: Lenda Kelp PA-C Entered By: Curtis Sites on 07/08/2017 10:57:40 Jamie Glass, Jamie Glass ([id number]) -------------------------------------------------------------------------------- HPI Details Patient Name: Jamie Glass Date of Service: 07/08/2017 9:45 AM Medical Record Number: [id number] Patient Account Number: [id number] Date of Birth/Sex: 22-Jul-1927 (84 y.o. Female) Treating RN: Curtis Sites Primary Care Provider: Marcelino Duster Other Clinician: Referring Provider: Marcelino Duster Treating Provider/Extender: Linwood Dibbles, Arvon Schreiner Weeks in Treatment: 0 History of Present Illness Associated Signs and Symptoms: Patient has a history of diabetes mellitus type II, hypertension, and diabetic peripheral neuropathy. HPI Description: 07/08/17 other valuation today patient appears to be doing okay overall. She does have an issue with her left ilium region where she has a pressure ulcer. This shows a significant amount of the chronic tissue in the wound bed that looks as if it is going to require sharp debridement today. She does have some discomfort with cleansing and palpation over the region. Fortunately there does not appear to be any evidence of severe infection which is good news. Patient does have dementia and therefore is not able to give much of the weight of history. Electronic Signature(s) Signed: 07/12/2017 5:56:33 PM By: Lenda Kelp PA-C Entered By: Lenda Kelp on 07/09/2017 20:34:13 Jamie Glass, Jamie Glass ([id number]) -------------------------------------------------------------------------------- Physical Exam Details Patient Name: Jamie Glass Date of Service: 07/08/2017 9:45 AM Medical Record Number: [id number] Patient Account  Number: [id number] Date of Birth/Sex: 1928-02-05 (84 y.o. Female) Treating RN: Curtis Sites Primary Care Provider: Marcelino Duster Other Clinician: Referring Provider: Marcelino Duster Treating Provider/Extender: Linwood Dibbles, Rae Plotner Weeks in Treatment: 0 Constitutional sitting or standing blood pressure is within target range for patient.. pulse regular and within target range for patient.Marland Kitchen respirations regular, non-labored and within target range for patient.Marland Kitchen temperature within target range for patient.. Well- nourished and well-hydrated in no acute distress. Eyes conjunctiva clear no eyelid edema noted. pupils equal round and  reactive to light and accommodation. Ears, Nose, Mouth, and Throat no gross abnormality of ear auricles or external auditory canals. normal hearing noted during conversation. mucus membranes moist. Respiratory normal breathing without difficulty. clear to auscultation bilaterally. Cardiovascular regular rate and rhythm with normal S1, S2. no clubbing, cyanosis, significant edema, <3 sec cap refill. Gastrointestinal (GI) soft, non-tender, non-distended, +BS. no ventral hernia noted. Musculoskeletal Patient unable to walk without assistance. Psychiatric this patient is able to make decisions and demonstrates good insight into disease process. Alert and Oriented x 3. pleasant and cooperative. Notes Patients were owned at this point did require sharp debridement which I did perform today utilizing scissors and forceps. I was able to remove the necrotic eschar/slough in the base of the wound. She did not have any bleeding with debridement today although it was significantly deeper post debridement unfortunately. Nonetheless I do feel like that this will actually do much better as far as being able to get a dressing into the area to help clean it up as much as possible. I did not perform aggressive debridement once I was able to remove the bulk of the necrotic tissue due to  the fact that this already was causing pain for the patient and I did not want to cause additional pain. Electronic Signature(s) Signed: 07/12/2017 5:56:33 PM By: Lenda Kelp PA-C Entered By: Lenda Kelp on 07/09/2017 20:35:53 Jamie Glass, Jamie Glass ([id number]) -------------------------------------------------------------------------------- Physician Orders Details Patient Name: Jamie Glass Date of Service: 07/08/2017 9:45 AM Medical Record Number: [id number] Patient Account Number: [id number] Date of Birth/Sex: 08-Aug-1927 (84 y.o. Female) Treating RN: Curtis Sites Primary Care Provider: Marcelino Duster Other Clinician: Referring Provider: Marcelino Duster Treating Provider/Extender: Linwood Dibbles, Aneliz Carbary Weeks in Treatment: 0 Verbal / Phone Orders: No Diagnosis Coding Wound Cleansing Wound #1 Right Ilium o Clean wound with Normal Saline. o Cleanse wound with mild soap and water Anesthetic (add to Medication List) Wound #1 Right Ilium o Topical Lidocaine 4% cream applied to wound bed prior to debridement (In Clinic Only). Primary Wound Dressing Wound #1 Right Ilium o Other: - Dakin's soaked gauze Secondary Dressing Wound #1 Right Ilium o ABD pad o Boardered Foam Dressing - please cover right trochanter with bordered foam dressing for protection o XtraSorb - secure with tape Dressing Change Frequency Wound #1 Right Ilium o Change dressing every day. Follow-up Appointments Wound #1 Right Ilium o Return Appointment in 1 week. Off-Loading Wound #1 Right Ilium o Roho cushion for wheelchair - [facility] please make sure patient has an appropriate w/c cushion o Turn and reposition every 2 hours - HHRN please make sure patient has appropriate mattress cover for pressure relief Additional Orders / Instructions Wound #1 Right Ilium o Vitamin A; Vitamin C, Zinc o Increase protein intake. Home Health Wound #1 Right Ilium o Continue Home Health Visits Jamie Glass, Jamie Glass ([id number]) o Home Health Nurse may visit PRN to address patientos wound care needs. o FACE TO FACE ENCOUNTER: MEDICARE and MEDICAID PATIENTS: I certify that this patient is under my care and that I had a face-to-face encounter that meets the physician face-to-face encounter requirements with this patient on this date. The encounter with the patient was in whole or in part for the following MEDICAL CONDITION: (primary reason for Home Healthcare) MEDICAL NECESSITY: I certify, that based on my findings, NURSING services are a medically necessary home health service. HOME BOUND STATUS: I certify that my clinical findings support that this patient is homebound (i.e., Due to illness or injury, pt requires  aid of supportive devices such as crutches, cane, wheelchairs, walkers, the use of special transportation or the assistance of another person to leave their place of residence. There is a normal inability to leave the home and doing so requires considerable and taxing effort. Other absences are for medical reasons / religious services and are infrequent or of short duration when for other reasons). o If current dressing causes regression in wound condition, may D/C ordered dressing product/s and apply Normal Saline Moist Dressing daily until next Wound Healing Center / Other MD appointment. Notify Wound Healing Center of regression in wound condition at [phone]. o Please direct any NON-WOUND related issues/requests for orders to patient's Primary Care Physician Laboratory o Bacteria identified in Wound by Culture (MICRO) oooo LOINC Code: 6462-6 oooo Convenience Name: Wound culture routine Patient Medications Allergies: No Known Allergies Notifications Medication Indication Start End Augmentin 07/12/2017 DOSE 1 - oral 875 mg-125 mg tablet - 1 tablet oral taken 2 times a day for 14 days Electronic Signature(s) Signed: 07/12/2017 6:00:28 PM By: Lenda Kelp PA-C Previous  Signature: 07/08/2017 4:48:08 PM Version By: Curtis Sites Previous Signature: 07/12/2017 5:56:33 PM Version By: Lenda Kelp PA-C Entered By: Lenda Kelp on 07/12/2017 18:00:28 Jamie Glass, Jamie Glass ([id number]) -------------------------------------------------------------------------------- Problem List Details Patient Name: Jamie Glass Date of Service: 07/08/2017 9:45 AM Medical Record Number: [id number] Patient Account Number: [id number] Date of Birth/Sex: January 11, 1928 (84 y.o. Female) Treating RN: Curtis Sites Primary Care Provider: Marcelino Duster Other Clinician: Referring Provider: Marcelino Duster Treating Provider/Extender: Linwood Dibbles, Jlyn Cerros Weeks in Treatment: 0 Active Problems ICD-10 Impacting Encounter Code Description Active Date Wound Healing Diagnosis L89.890 Pressure ulcer of other site, unstageable 07/09/2017 Yes E11.622 Type 2 diabetes mellitus with other skin ulcer 07/09/2017 Yes I10 Essential (primary) hypertension 07/09/2017 Yes E11.40 Type 2 diabetes mellitus with diabetic neuropathy, 07/09/2017 Yes unspecified Inactive Problems Resolved Problems Electronic Signature(s) Signed: 07/12/2017 5:56:33 PM By: Lenda Kelp PA-C Entered By: Lenda Kelp on 07/09/2017 20:07:52 Jamie Glass, Jamie Glass ([id number]) -------------------------------------------------------------------------------- Progress Note Details Patient Name: Jamie Glass Date of Service: 07/08/2017 9:45 AM Medical Record Number: [id number] Patient Account Number: [id number] Date of Birth/Sex: Jun 23, 1927 (84 y.o. Female) Treating RN: Curtis Sites Primary Care Provider: Marcelino Duster Other Clinician: Referring Provider: Marcelino Duster Treating Provider/Extender: Linwood Dibbles, Zenola Dezarn Weeks in Treatment: 0 Subjective Chief Complaint Information obtained from Patient Right Illium pressure ulcer History of Present Illness (HPI) The following HPI elements were documented for the patient's wound: Associated Signs and  Symptoms: Patient has a history of diabetes mellitus type II, hypertension, and diabetic peripheral neuropathy. 07/08/17 other valuation today patient appears to be doing okay overall. She does have an issue with her left ilium region where she has a pressure ulcer. This shows a significant amount of the chronic tissue in the wound bed that looks as if it is going to require sharp debridement today. She does have some discomfort with cleansing and palpation over the region. Fortunately there does not appear to be any evidence of severe infection which is good news. Patient does have dementia and therefore is not able to give much of the weight of history. Wound History Patient presents with 1 open wound that has been present for approximately 1 month. Patient has been treating wound in the following manner: HHRN. Laboratory tests have been performed in the last month. Patient reportedly has tested positive for an antibiotic resistant organism. Patient History Unable to Obtain Patient History due to Aphasia. Information obtained from Chart. Allergies No Known Allergies  General Notes: Per Art therapist Medical History Cardiovascular Patient has history of Congestive Heart Failure, Hypertension Endocrine Patient has history of Type II Diabetes Musculoskeletal Patient has history of Osteoarthritis Neurologic Patient has history of Dementia Medical And Surgical History Notes Genitourinary CKD Review of Systems (ROS) Jamie Glass, Jamie Glass ([id number]) Integumentary (Skin) Complains or has symptoms of Wounds. General Notes: Patient was not able to give history. Objective Constitutional sitting or standing blood pressure is within target range for patient.. pulse regular and within target range for patient.Marland Kitchen respirations regular, non-labored and within target range for patient.Marland Kitchen temperature within target range for patient.. Well- nourished and well-hydrated in no acute  distress. Vitals Time Taken: 10:09 AM, Temperature: 97.5 F, Pulse: 81 bpm, Respiratory Rate: 16 breaths/min, Blood Pressure: 155/89 mmHg. Eyes conjunctiva clear no eyelid edema noted. pupils equal round and reactive to light and accommodation. Ears, Nose, Mouth, and Throat no gross abnormality of ear auricles or external auditory canals. normal hearing noted during conversation. mucus membranes moist. Respiratory normal breathing without difficulty. clear to auscultation bilaterally. Cardiovascular regular rate and rhythm with normal S1, S2. no clubbing, cyanosis, significant edema, Gastrointestinal (GI) soft, non-tender, non-distended, +BS. no ventral hernia noted. Musculoskeletal Patient unable to walk without assistance. Psychiatric this patient is able to make decisions and demonstrates good insight into disease process. Alert and Oriented x 3. pleasant and cooperative. General Notes: Patients were owned at this point did require sharp debridement which I did perform today utilizing scissors and forceps. I was able to remove the necrotic eschar/slough in the base of the wound. She did not have any bleeding with debridement today although it was significantly deeper post debridement unfortunately. Nonetheless I do feel like that this will actually do much better as far as being able to get a dressing into the area to help clean it up as much as possible. I did not perform aggressive debridement once I was able to remove the bulk of the necrotic tissue due to the fact that this already was causing pain for the patient and I did not want to cause additional pain. Integumentary (Hair, Skin) Wound #1 status is Open. Original cause of wound was Pressure Injury. The wound is located on the Right Ilium. The wound measures 2.5cm length x 2cm width x 0.8cm depth; 3.927cm^2 area and 3.142cm^3 volume. There is a large amount of serous drainage noted. Foul odor after cleansing was noted. The  wound margin is well defined and not attached to the wound base. There is no granulation within the wound bed. There is a large (67-100%) amount of necrotic tissue within the wound bed including Eschar and Adherent Slough. The periwound skin appearance exhibited: Hemosiderin Staining. The periwound skin appearance did not exhibit: Callus, Crepitus, Excoriation, Induration, Rash, Scarring, Dry/Scaly, Maceration, Atrophie Ebrahimi, Faria ([id number]) Blanche, Cyanosis, Ecchymosis, Mottled, Pallor, Rubor, Erythema. Assessment Active Problems ICD-10 L89.890 - Pressure ulcer of other site, unstageable E11.622 - Type 2 diabetes mellitus with other skin ulcer I10 - Essential (primary) hypertension E11.40 - Type 2 diabetes mellitus with diabetic neuropathy, unspecified Procedures Wound #1 Pre-procedure diagnosis of Wound #1 is a Pressure Ulcer located on the Right Ilium . There was a Excisional Skin/Subcutaneous Tissue Debridement with a total area of 5 sq cm performed by STONE III, Doretta Remmert E., PA-C. With the following instrument(s): Forceps, and Scissors. to remove Viable and Non-Viable tissue/material Material removed includes Eschar, Subcutaneous Tissue, and Slough, and Slough after achieving pain control using Lidocaine 4% Topical Solution. No specimens were taken. A  time out was conducted at 10:52, prior to the start of the procedure. A Minimum amount of bleeding was controlled with Pressure. The procedure was tolerated well with a pain level of 0 throughout and a pain level of 0 following the procedure. Post Debridement Measurements: 2.5cm length x 2cm width x 1.2cm depth; 4.712cm^3 volume. Post debridement Stage noted as Category/Stage IV. Character of Wound/Ulcer Post Debridement is improved. Post procedure Diagnosis Wound #1: Same as Pre-Procedure Plan Wound Cleansing: Wound #1 Right Ilium: Clean wound with Normal Saline. Cleanse wound with mild soap and water Anesthetic (add to Medication  List): Wound #1 Right Ilium: Topical Lidocaine 4% cream applied to wound bed prior to debridement (In Clinic Only). Primary Wound Dressing: Wound #1 Right Ilium: Other: - Dakin's soaked gauze Secondary Dressing: Wound #1 Right Ilium: ABD pad Boardered Foam Dressing - please cover right trochanter with bordered foam dressing for protection XtraSorb - secure with tape Dressing Change Frequency: Wound #1 Right Ilium: Sydney, Mesha ([id number]) Change dressing every day. Follow-up Appointments: Wound #1 Right Ilium: Return Appointment in 1 week. Off-Loading: Wound #1 Right Ilium: Roho cushion for wheelchair - HHRN please make sure patient has an appropriate w/c cushion Turn and reposition every 2 hours - HHRN please make sure patient has appropriate mattress cover for pressure relief Additional Orders / Instructions: Wound #1 Right Ilium: Vitamin A; Vitamin C, Zinc Increase protein intake. Home Health: Wound #1 Right Ilium: Continue Home Health Visits Home Health Nurse may visit PRN to address patient s wound care needs. FACE TO FACE ENCOUNTER: MEDICARE and MEDICAID PATIENTS: I certify that this patient is under my care and that I had a face-to-face encounter that meets the physician face-to-face encounter requirements with this patient on this date. The encounter with the patient was in whole or in part for the following MEDICAL CONDITION: (primary reason for Home Healthcare) MEDICAL NECESSITY: I certify, that based on my findings, NURSING services are a medically necessary home health service. HOME BOUND STATUS: I certify that my clinical findings support that this patient is homebound (i.e., Due to illness or injury, pt requires aid of supportive devices such as crutches, cane, wheelchairs, walkers, the use of special transportation or the assistance of another person to leave their place of residence. There is a normal inability to leave the home and doing so requires  considerable and taxing effort. Other absences are for medical reasons / religious services and are infrequent or of short duration when for other reasons). If current dressing causes regression in wound condition, may D/C ordered dressing product/s and apply Normal Saline Moist Dressing daily until next Wound Healing Center / Other MD appointment. Notify Wound Healing Center of regression in wound condition at [phone]. Please direct any NON-WOUND related issues/requests for orders to patient's Primary Care Physician Laboratory ordered were: Wound culture routine The following medication(s) was prescribed: Augmentin oral 875 mg-125 mg tablet 1 1 tablet oral taken 2 times a day for 14 days starting 07/12/2017 This patient is a ward of the state and we did obtain consent for debridement and treatment prior to evaluation today from her legal healthcare power of attorney. Fortunately I do feel like the pressure ulcer appears somewhat better. I do think we need to check on her bed as well is her cushion for her wheelchair to ensure both are appropriate as far as offloading is concerned if not we may need to make some recommendations to help correct this. Nonetheless in general I feel like the patient  seems to be doing fairly well with no evidence of new injury at this point what's there appears to be older in nature due to inspection. We will see where things stand in one weeks time was here for reevaluation. I did perform a wound culture on this wound today. Please see above for specific wound care orders. We will see patient for re-evaluation in 1 week(s) here in the clinic. If anything worsens or changes patient will contact our office for additional recommendations. Electronic Signature(s) Unsigned Previous Signature: 07/12/2017 5:56:33 PM Version By: Lenda Kelp PA-C Entered By: Lenda Kelp on 07/12/2017 18:00:51 Signature(s): Date(s): Jamie Glass, Jamie Glass  ([id number]) -------------------------------------------------------------------------------- ROS/PFSH Details Patient Name: Jamie Glass Date of Service: 07/08/2017 9:45 AM Medical Record Number: [id number] Patient Account Number: [id number] Date of Birth/Sex: 1928-02-13 (84 y.o. Female) Treating RN: Huel Coventry Primary Care Provider: Marcelino Duster Other Clinician: Referring Provider: Marcelino Duster Treating Provider/Extender: Linwood Dibbles, Thressa Shiffer Weeks in Treatment: 0 Unable to Obtain Patient History due to oo Aphasia Information Obtained From Chart Wound History Do you currently have one or more open woundso Yes How many open wounds do you currently haveo 1 Approximately how long have you had your woundso 1 month How have you been treating your wound(s) until nowo HHRN Integumentary (Skin) Complaints and Symptoms: Positive for: Wounds Cardiovascular Medical History: Positive for: Congestive Heart Failure; Hypertension Endocrine Medical History: Positive for: Type II Diabetes Genitourinary Medical History: Past Medical History Notes: CKD Musculoskeletal Medical History: Positive for: Osteoarthritis Neurologic Medical History: Positive for: Dementia Immunizations Pneumococcal Vaccine: Received Pneumococcal Vaccination: Yes Implantable Devices Jamie Glass, Jamie Glass ([id number]) Notes Patient was not able to give history. Electronic Signature(s) Signed: 07/08/2017 5:40:02 PM By: Elliot Gurney, BSN, RN, CWS, Kim RN, BSN Signed: 07/12/2017 5:56:33 PM By: Lenda Kelp PA-C Entered By: Elliot Gurney BSN, RN, CWS, Kim on 07/08/2017 10:39:43 Jamie Glass, Jamie Glass ([id number]) -------------------------------------------------------------------------------- SuperBill Details Patient Name: Jamie Glass Date of Service: 07/08/2017 Medical Record Number: [id number] Patient Account Number: [id number] Date of Birth/Sex: [birth year] (84 y.o. Female) Treating RN: Curtis Sites Primary Care Provider: Marcelino Duster Other Clinician: Referring Provider: Marcelino Duster Treating Provider/Extender: Linwood Dibbles, Moraima Burd Weeks in Treatment: 0 Diagnosis Coding ICD-10 Codes Code Description L89.890 Pressure ulcer of other site, unstageable E11.622 Type 2 diabetes mellitus with other skin ulcer I10 Essential (primary) hypertension E11.40 Type 2 diabetes mellitus with diabetic neuropathy, unspecified Facility Procedures CPT4 Code: 24401027 Description: 99213 - WOUND CARE VISIT-LEV 3 EST PT Modifier: Quantity: 1 CPT4 Code: [id number] Description: 11042 - DEB SUBQ TISSUE 20 SQ CM/< ICD-10 Diagnosis Description L89.890 Pressure ulcer of other site, unstageable Modifier: Quantity: 1 Physician Procedures CPT4 Code: 3474259 Description: 99204 - WC PHYS LEVEL 4 - NEW PT ICD-10 Diagnosis Description L89.890 Pressure ulcer of other site, unstageable E11.622 Type 2 diabetes mellitus with other skin ulcer I10 Essential (primary) hypertension E11.40 Type 2 diabetes mellitus with  diabetic neuropathy, unspeci Modifier: 25 fied Quantity: 1 CPT4 Code: [id number] Description: 11042 - WC PHYS SUBQ TISS 20 SQ CM ICD-10 Diagnosis Description L89.890 Pressure ulcer of other site, unstageable Modifier: Quantity: 1 Electronic Signature(s) Signed: 07/12/2017 5:56:33 PM By: Lenda Kelp PA-C Entered By: Lenda Kelp on 07/09/2017 20:40:29

## 2017-07-15 ENCOUNTER — Encounter: Payer: Medicare Other | Admitting: Physician Assistant

## 2017-07-15 DIAGNOSIS — L89894 Pressure ulcer of other site, stage 4: Secondary | ICD-10-CM | POA: Diagnosis not present

## 2017-07-16 ENCOUNTER — Emergency Department: Payer: Medicare Other

## 2017-07-16 ENCOUNTER — Observation Stay: Payer: Medicare Other

## 2017-07-16 ENCOUNTER — Encounter: Payer: Self-pay | Admitting: Emergency Medicine

## 2017-07-16 ENCOUNTER — Observation Stay
Admission: EM | Admit: 2017-07-16 | Discharge: 2017-07-20 | Disposition: A | Discharge status: Skilled Nursing Facility | Payer: Medicare Other | Attending: Internal Medicine | Admitting: Internal Medicine

## 2017-07-16 ENCOUNTER — Other Ambulatory Visit: Payer: Self-pay

## 2017-07-16 DIAGNOSIS — G9341 Metabolic encephalopathy: Secondary | ICD-10-CM | POA: Diagnosis not present

## 2017-07-16 DIAGNOSIS — F419 Anxiety disorder, unspecified: Secondary | ICD-10-CM | POA: Insufficient documentation

## 2017-07-16 DIAGNOSIS — R4182 Altered mental status, unspecified: Secondary | ICD-10-CM

## 2017-07-16 DIAGNOSIS — I509 Heart failure, unspecified: Secondary | ICD-10-CM | POA: Diagnosis not present

## 2017-07-16 DIAGNOSIS — E1122 Type 2 diabetes mellitus with diabetic chronic kidney disease: Secondary | ICD-10-CM | POA: Diagnosis not present

## 2017-07-16 DIAGNOSIS — F17210 Nicotine dependence, cigarettes, uncomplicated: Secondary | ICD-10-CM | POA: Insufficient documentation

## 2017-07-16 DIAGNOSIS — E1165 Type 2 diabetes mellitus with hyperglycemia: Secondary | ICD-10-CM | POA: Insufficient documentation

## 2017-07-16 DIAGNOSIS — R739 Hyperglycemia, unspecified: Secondary | ICD-10-CM

## 2017-07-16 DIAGNOSIS — Z882 Allergy status to sulfonamides status: Secondary | ICD-10-CM | POA: Diagnosis not present

## 2017-07-16 DIAGNOSIS — Z8673 Personal history of transient ischemic attack (TIA), and cerebral infarction without residual deficits: Secondary | ICD-10-CM | POA: Diagnosis not present

## 2017-07-16 DIAGNOSIS — E785 Hyperlipidemia, unspecified: Secondary | ICD-10-CM | POA: Diagnosis not present

## 2017-07-16 DIAGNOSIS — Z79899 Other long term (current) drug therapy: Secondary | ICD-10-CM | POA: Diagnosis not present

## 2017-07-16 DIAGNOSIS — N189 Chronic kidney disease, unspecified: Secondary | ICD-10-CM | POA: Diagnosis not present

## 2017-07-16 DIAGNOSIS — F0390 Unspecified dementia without behavioral disturbance: Principal | ICD-10-CM

## 2017-07-16 DIAGNOSIS — L89159 Pressure ulcer of sacral region, unspecified stage: Secondary | ICD-10-CM | POA: Insufficient documentation

## 2017-07-16 DIAGNOSIS — R627 Adult failure to thrive: Secondary | ICD-10-CM | POA: Insufficient documentation

## 2017-07-16 DIAGNOSIS — R410 Disorientation, unspecified: Secondary | ICD-10-CM

## 2017-07-16 DIAGNOSIS — E860 Dehydration: Secondary | ICD-10-CM | POA: Insufficient documentation

## 2017-07-16 DIAGNOSIS — I130 Hypertensive heart and chronic kidney disease with heart failure and stage 1 through stage 4 chronic kidney disease, or unspecified chronic kidney disease: Secondary | ICD-10-CM | POA: Diagnosis not present

## 2017-07-16 DIAGNOSIS — Z7982 Long term (current) use of aspirin: Secondary | ICD-10-CM | POA: Insufficient documentation

## 2017-07-16 DIAGNOSIS — E1151 Type 2 diabetes mellitus with diabetic peripheral angiopathy without gangrene: Secondary | ICD-10-CM | POA: Diagnosis not present

## 2017-07-16 DIAGNOSIS — B999 Unspecified infectious disease: Secondary | ICD-10-CM

## 2017-07-16 DIAGNOSIS — E11621 Type 2 diabetes mellitus with foot ulcer: Secondary | ICD-10-CM | POA: Insufficient documentation

## 2017-07-16 DIAGNOSIS — Z1389 Encounter for screening for other disorder: Secondary | ICD-10-CM

## 2017-07-16 DIAGNOSIS — N390 Urinary tract infection, site not specified: Secondary | ICD-10-CM

## 2017-07-16 DIAGNOSIS — E876 Hypokalemia: Secondary | ICD-10-CM | POA: Diagnosis not present

## 2017-07-16 DIAGNOSIS — L89629 Pressure ulcer of left heel, unspecified stage: Secondary | ICD-10-CM | POA: Diagnosis not present

## 2017-07-16 LAB — COMPREHENSIVE METABOLIC PANEL
ALT: 41 U/L (ref 14–54)
ANION GAP: 8 (ref 5–15)
AST: 56 U/L — AB (ref 15–41)
Albumin: 3 g/dL — ABNORMAL LOW (ref 3.5–5.0)
Alkaline Phosphatase: 82 U/L (ref 38–126)
BUN: 20 mg/dL (ref 6–20)
CHLORIDE: 108 mmol/L (ref 101–111)
CO2: 23 mmol/L (ref 22–32)
Calcium: 8.6 mg/dL — ABNORMAL LOW (ref 8.9–10.3)
Creatinine, Ser: 0.8 mg/dL (ref 0.44–1.00)
GFR calc Af Amer: >60 mL/min (ref >60)
GFR calc non Af Amer: >60 mL/min (ref >60)
GLUCOSE: 170 mg/dL — AB (ref 65–99)
POTASSIUM: 3.5 mmol/L (ref 3.5–5.1)
Sodium: 139 mmol/L (ref 135–145)
Total Bilirubin: 0.6 mg/dL (ref 0.3–1.2)
Total Protein: 7.4 g/dL (ref 6.5–8.1)

## 2017-07-16 LAB — CBC
HEMATOCRIT: 39.1 % (ref 35.0–47.0)
Hemoglobin: 12.9 g/dL (ref 12.0–16.0)
MCH: 26 pg (ref 26.0–34.0)
MCHC: 33 g/dL (ref 32.0–36.0)
MCV: 78.8 fL — AB (ref 80.0–100.0)
Platelets: 383 10*3/uL (ref 150–440)
RBC: 4.96 MIL/uL (ref 3.80–5.20)
RDW: 16.6 % — AB (ref 11.5–14.5)
WBC: 8.1 10*3/uL (ref 3.6–11.0)

## 2017-07-16 LAB — URINALYSIS, COMPLETE (UACMP) WITH MICROSCOPIC
BILIRUBIN URINE: NEGATIVE
Bacteria, UA: NONE SEEN
GLUCOSE, UA: NEGATIVE mg/dL
Ketones, ur: NEGATIVE mg/dL
LEUKOCYTES UA: NEGATIVE
Nitrite: NEGATIVE
PROTEIN: 100 mg/dL — AB
SPECIFIC GRAVITY, URINE: 1.018 (ref 1.005–1.030)
pH: 5 (ref 5.0–8.0)

## 2017-07-16 LAB — GLUCOSE, CAPILLARY
GLUCOSE-CAPILLARY: 85 mg/dL (ref 65–99)
Glucose-Capillary: 161 mg/dL — ABNORMAL HIGH (ref 65–99)

## 2017-07-16 LAB — MRSA PCR SCREENING: MRSA BY PCR: NEGATIVE

## 2017-07-16 MED ORDER — IPRATROPIUM-ALBUTEROL 0.5-2.5 (3) MG/3ML IN SOLN: 3.0000 mL | Freq: Four times a day (QID) | RESPIRATORY_TRACT | Status: DC

## 2017-07-16 MED ORDER — HYDRALAZINE HCL 20 MG/ML IJ SOLN
10.0000 mg | Freq: Once | INTRAMUSCULAR | Status: AC
  Administered 2017-07-16: 10 mg via INTRAVENOUS
  Filled 2017-07-16: qty 1

## 2017-07-16 MED ORDER — IPRATROPIUM-ALBUTEROL 0.5-2.5 (3) MG/3ML IN SOLN: 3.0000 mL | Freq: Four times a day (QID) | RESPIRATORY_TRACT | Status: DC | PRN

## 2017-07-16 MED ORDER — HEPARIN SODIUM (PORCINE) 5000 UNIT/ML IJ SOLN
5000.0000 [IU] | Freq: Three times a day (TID) | INTRAMUSCULAR | Status: DC
  Administered 2017-07-16 – 2017-07-20 (×13): 5000 [IU] via SUBCUTANEOUS
  Filled 2017-07-16 (×13): qty 1

## 2017-07-16 MED ORDER — AMLODIPINE BESYLATE 10 MG PO TABS
10.0000 mg | ORAL_TABLET | Freq: Every day | ORAL | Status: DC
  Administered 2017-07-17 – 2017-07-20 (×4): 10 mg via ORAL
  Filled 2017-07-16 (×4): qty 1

## 2017-07-16 MED ORDER — OLANZAPINE 5 MG PO TABS
5.0000 mg | ORAL_TABLET | Freq: Every day | ORAL | Status: DC
  Administered 2017-07-17 – 2017-07-19 (×3): 5 mg via ORAL
  Filled 2017-07-16 (×4): qty 1

## 2017-07-16 MED ORDER — SODIUM CHLORIDE 0.9 % IV SOLN
1.0000 g | INTRAVENOUS | Status: DC
  Administered 2017-07-16 – 2017-07-17 (×2): 1 g via INTRAVENOUS
  Filled 2017-07-16: qty 10
  Filled 2017-07-16: qty 1

## 2017-07-16 MED ORDER — ASPIRIN EC 81 MG PO TBEC
81.0000 mg | DELAYED_RELEASE_TABLET | Freq: Every day | ORAL | Status: DC
  Administered 2017-07-17 – 2017-07-20 (×3): 81 mg via ORAL
  Filled 2017-07-16 (×3): qty 1

## 2017-07-16 MED ORDER — METOPROLOL TARTRATE 25 MG PO TABS
25.0000 mg | ORAL_TABLET | Freq: Two times a day (BID) | ORAL | Status: DC
  Administered 2017-07-17 – 2017-07-20 (×6): 25 mg via ORAL
  Filled 2017-07-16 (×7): qty 1

## 2017-07-16 MED ORDER — DOCUSATE SODIUM 100 MG PO CAPS
100.0000 mg | ORAL_CAPSULE | Freq: Two times a day (BID) | ORAL | Status: DC
  Administered 2017-07-17 – 2017-07-20 (×4): 100 mg via ORAL
  Filled 2017-07-16 (×6): qty 1

## 2017-07-16 MED ORDER — ISOSORBIDE MONONITRATE ER 30 MG PO TB24
60.0000 mg | ORAL_TABLET | Freq: Every day | ORAL | Status: DC
  Administered 2017-07-17 – 2017-07-20 (×3): 60 mg via ORAL
  Filled 2017-07-16 (×3): qty 2

## 2017-07-16 MED ORDER — FAMOTIDINE IN NACL 20-0.9 MG/50ML-% IV SOLN
20.0000 mg | Freq: Two times a day (BID) | INTRAVENOUS | Status: DC
  Administered 2017-07-16 – 2017-07-17 (×2): 20 mg via INTRAVENOUS
  Filled 2017-07-16 (×2): qty 50

## 2017-07-16 MED ORDER — BISACODYL 10 MG RE SUPP: 10.0000 mg | Freq: Every day | RECTAL | Status: DC | PRN

## 2017-07-16 MED ORDER — POTASSIUM CHLORIDE IN NACL 20-0.9 MEQ/L-% IV SOLN
INTRAVENOUS | Status: DC
  Administered 2017-07-16 – 2017-07-17 (×3): via INTRAVENOUS
  Filled 2017-07-16 (×5): qty 1000

## 2017-07-16 MED ORDER — INSULIN ASPART 100 UNIT/ML ~~LOC~~ SOLN
0.0000 [IU] | Freq: Three times a day (TID) | SUBCUTANEOUS | Status: DC
  Administered 2017-07-16: 2 [IU] via SUBCUTANEOUS
  Administered 2017-07-17 – 2017-07-18 (×2): 1 [IU] via SUBCUTANEOUS
  Administered 2017-07-20: 2 [IU] via SUBCUTANEOUS
  Filled 2017-07-16 (×5): qty 1

## 2017-07-16 MED ORDER — ACETAMINOPHEN 650 MG RE SUPP: 650.0000 mg | Freq: Four times a day (QID) | RECTAL | Status: DC | PRN

## 2017-07-16 MED ORDER — PRAVASTATIN SODIUM 20 MG PO TABS
20.0000 mg | ORAL_TABLET | Freq: Every day | ORAL | Status: DC
  Administered 2017-07-18 – 2017-07-20 (×3): 20 mg via ORAL
  Filled 2017-07-16 (×3): qty 1

## 2017-07-16 MED ORDER — ONDANSETRON HCL 4 MG/2ML IJ SOLN: 4.0000 mg | Freq: Four times a day (QID) | INTRAMUSCULAR | Status: DC | PRN

## 2017-07-16 MED ORDER — ACETAMINOPHEN 325 MG PO TABS: 650.0000 mg | ORAL_TABLET | Freq: Four times a day (QID) | ORAL | Status: DC | PRN

## 2017-07-16 MED ORDER — ONDANSETRON HCL 4 MG PO TABS: 4.0000 mg | ORAL_TABLET | Freq: Four times a day (QID) | ORAL | Status: DC | PRN

## 2017-07-16 NOTE — ED Notes (Signed)
Spoke with group home owner Jola Babinski at [phone]. Normally pt can move her arms and can pick up finger foods and drink from a cup (unable to use utensils). Pt's speech usually limited but will say "hello" and answer simple questions but per Jola Babinski this happens "when she wants" to talk.

## 2017-07-16 NOTE — ED Notes (Signed)
Report to amanda

## 2017-07-16 NOTE — ED Notes (Signed)
dss called back and they are aware [name] will be admitted to the hospital.

## 2017-07-16 NOTE — ED Provider Notes (Signed)
[facility] Emergency Department Provider Note   ____________________________________________    I have reviewed the triage vital signs and the nursing notes.   HISTORY  Chief Complaint Altered Mental Status   Patient unable to provide any history due to altered mental status  HPI Jamie Glass is a 84 y.o. female who presents with altered mental status.  Patient has a history of dementia but reportedly is typically able to answer simple questions, feed herself and help transferring to wheelchair.  Today she was found to be nearly unresponsive.   Past Medical History:  Diagnosis Date  . Anxiety   . Arthritis   . CHF (congestive heart failure) (HCC)   . Chronic kidney disease   . Dementia   . Diabetes mellitus without complication (HCC)   . Hypertension   . Stress headaches 08/21/2014    Patient Active Problem List   Diagnosis Date Noted  . Sepsis (HCC) 02/25/2016  . Pressure injury of skin 02/25/2016  . Pressure ulcer 11/03/2014  . Protein-calorie malnutrition, severe (HCC) 11/02/2014  . Acute renal failure (HCC) 10/31/2014  . Hyperkalemia 10/31/2014    History reviewed. No pertinent surgical history.  Prior to Admission medications   Medication Sig Start Date End Date Taking? Authorizing Provider  amLODipine (NORVASC) 10 MG tablet Take 1 tablet (10 mg total) by mouth daily. 02/28/16  Yes Adrian Saran, MD  amoxicillin-clavulanate (AUGMENTIN) 875-125 MG tablet Take 1 tablet by mouth 2 (two) times daily. 07/12/17 07/26/17 Yes [provider]  aspirin EC 81 MG tablet Take 81 mg by mouth daily.   Yes [provider]  gabapentin (NEURONTIN) 100 MG capsule Take 100 mg by mouth at bedtime.    Yes [provider]  isosorbide mononitrate (IMDUR) 60 MG 24 hr tablet Take 1 tablet (60 mg total) by mouth daily. 02/28/16  Yes Mody, Patricia Pesa, MD  metoprolol tartrate (LOPRESSOR) 25 MG tablet Take 25 mg by mouth 2 (two) times daily.    Yes [provider]  OLANZapine (ZYPREXA) 5 MG tablet Take 5 mg by mouth at bedtime.   Yes [provider]  pravastatin (PRAVACHOL) 20 MG tablet Take 20 mg by mouth daily.   Yes [provider]     Allergies Sulfa antibiotics  Family History  Problem Relation Age of Onset  . Hypertension Other     Social History Social History   Tobacco Use  . Smoking status: Current Every Day Smoker    Packs/day: 0.50    Years: 84.00    Pack years: 42.00    Types: Cigarettes  . Smokeless tobacco: Never Used  Substance Use Topics  . Alcohol use: No  . Drug use: No    Unable to obtain review of Systems     ____________________________________________   PHYSICAL EXAM:  VITAL SIGNS: ED Triage Vitals  Enc Vitals Group     BP 07/16/17 1257 (!) 164/109     Pulse Rate 07/16/17 1257 77     Resp 07/16/17 1257 (!) 22     Temp 07/16/17 1257 98.4 F (36.9 C)     Temp Source 07/16/17 1257 Oral     SpO2 07/16/17 1257 97 %     Weight 07/16/17 1258 54.4 kg (120 lb)     Height 07/16/17 1258 1.6 m ( )     Head Circumference --      Peak Flow --      Pain Score --      Pain Loc --  Pain Edu? --      Excl. in GC? --     Constitutional: Eyes open, does not respond Eyes: Conjunctivae are normal.  PERRLA Head: Atraumatic.  Mouth/Throat: Mucous membranes are dry Neck: No vertebral tenderness noted Cardiovascular: Normal rate, regular rhythm. Grossly normal heart sounds.  Good peripheral circulation. Respiratory: Normal respiratory effort.  No retractions.  Gastrointestinal: Soft and nontender. No distention.  .  Musculoskeletal: No lower extremity tenderness nor edema.  Warm and well perfused Neurologic: Appears to move all extremities Skin:  Skin is warm, dry, chronic decubitus ulcer   ____________________________________________   LABS (all labs ordered are listed, but only abnormal results are displayed)  Labs Reviewed  COMPREHENSIVE  METABOLIC PANEL - Abnormal; Notable for the following components:      Result Value   Glucose, Bld 170 (*)    Calcium 8.6 (*)    Albumin 3.0 (*)    AST 56 (*)    All other components within normal limits  CBC - Abnormal; Notable for the following components:   MCV 78.8 (*)    RDW 16.6 (*)    All other components within normal limits  URINALYSIS, COMPLETE (UACMP) WITH MICROSCOPIC - Abnormal; Notable for the following components:   Color, Urine YELLOW (*)    APPearance HAZY (*)    Hgb urine dipstick SMALL (*)    Protein, ur 100 (*)    All other components within normal limits  CBG MONITORING, ED   ____________________________________________  EKG  ED ECG REPORT I, Jene Every, the attending physician, personally viewed and interpreted this ECG.  Date: 07/16/2017  Rhythm: normal sinus rhythm QRS Axis: normal Intervals: normal ST/T Wave abnormalities: normal Narrative Interpretation: no evidence of acute ischemia  ____________________________________________  RADIOLOGY  Chest x-ray negative for pneumonia CT head negative for acute ab normalities ____________________________________________   PROCEDURES  Procedure(s) performed: No  Procedures   Critical Care performed: No ____________________________________________   INITIAL IMPRESSION / ASSESSMENT AND PLAN / ED COURSE  Pertinent labs & imaging results that were available during my care of the patient were reviewed by me and considered in my medical decision making (see chart for details).  Patient with a history of dementia presents with altered mental status, apparently she is significantly off her baseline today.  Work-up is overall reassuring, will admit to the hospital service for further evaluation    ____________________________________________   FINAL CLINICAL IMPRESSION(S) / ED DIAGNOSES  Final diagnoses:  Altered mental status, unspecified altered mental status type        Note:   This document was prepared using Dragon voice recognition software and may include unintentional dictation errors.    Jene Every, MD 07/16/17 [phone]

## 2017-07-16 NOTE — ED Notes (Signed)
Called and left msg for dss on-call worker [phone].

## 2017-07-16 NOTE — H&P (Signed)
History and Physical    Kathe Wirick ZOX:[id number] DOB: 25-Mar-1927 DOA: 07/16/2017  Referring physician: Dr. Cyril Loosen PCP: Gracelyn Nurse, MD  Specialists: none  Chief Complaint: confusion  HPI: Jamie Glass is a 84 y.o. female has a past medical history significant for HTN, CHF, DM, and dementia now with worsening confusion and failure to thrive. Non-verbal in ER and unable to provide hx. ER work up unremarkable except for possible UTI. She is non-ambulatory and not eating. She is now admitted. Afebrile.   Review of Systems: unable to obtain from pt due to non-verbal status  Past Medical History:  Diagnosis Date  . Anxiety   . Arthritis   . CHF (congestive heart failure) (HCC)   . Chronic kidney disease   . Dementia   . Diabetes mellitus without complication (HCC)   . Hypertension   . Stress headaches 08/21/2014   History reviewed. No pertinent surgical history. Social History:  reports that she has been smoking cigarettes.  She has a 42.00 pack-year smoking history. She has never used smokeless tobacco. She reports that she does not drink alcohol or use drugs.  Allergies  Allergen Reactions  . Sulfa Antibiotics Other (See Comments)    Unknown reaction- listed on MAR.    Family History  Problem Relation Age of Onset  . Hypertension Other     Prior to Admission medications   Medication Sig Start Date End Date Taking? Authorizing Provider  amLODipine (NORVASC) 10 MG tablet Take 1 tablet (10 mg total) by mouth daily. 02/28/16  Yes Adrian Saran, MD  amoxicillin-clavulanate (AUGMENTIN) 875-125 MG tablet Take 1 tablet by mouth 2 (two) times daily. 07/12/17 07/26/17 Yes [provider]  aspirin EC 81 MG tablet Take 81 mg by mouth daily.   Yes [provider]  gabapentin (NEURONTIN) 100 MG capsule Take 100 mg by mouth at bedtime.    Yes [provider]  isosorbide mononitrate (IMDUR) 60 MG 24 hr tablet Take 1 tablet (60 mg total) by mouth daily.  02/28/16  Yes Mody, Patricia Pesa, MD  metoprolol tartrate (LOPRESSOR) 25 MG tablet Take 25 mg by mouth 2 (two) times daily.   Yes [provider]  OLANZapine (ZYPREXA) 5 MG tablet Take 5 mg by mouth at bedtime.   Yes [provider]  pravastatin (PRAVACHOL) 20 MG tablet Take 20 mg by mouth daily.   Yes [provider]   Physical Exam: Vitals:   07/16/17 1330 07/16/17 1400 07/16/17 1430 07/16/17 1500  BP: (!) 162/70 (!) 153/77 (!) 145/75 (!) 157/72  Pulse:   72 69  Resp: 18  (!) 24 17  Temp:      TempSrc:      SpO2:   100% 100%  Weight:      Height:         General:  No apparent distress, [place]/AT, cachectic  Eyes: PERRL, EOMI, no scleral icterus, conjunctiva clear  ENT: dry oropharynx without exudate, TM's benign, dentition poor  Neck: supple, no lymphadenopathy. No bruits or thyromegaly  Cardiovascular: regular rate without MRG; 2+ peripheral pulses, no JVD, no peripheral edema  Respiratory: basilar rhonchi without wheezes or rales. No dullness. Respiratory effort normal  Abdomen: soft, non tender to palpation, positive bowel sounds, no guarding, no rebound  Skin: no rashes or lesions  Musculoskeletal: normal bulk and tone, no joint swelling  Psychiatric: non-verbal  Neurologic: CN 2-12 grossly intact, Motor strength 5/5 in all 4 groups with symmetric DTR's and non-focal sensory exam  Labs  on Admission:  Basic Metabolic Panel: Recent Labs  Lab 07/16/17 1310  NA 139  K 3.5  CL 108  CO2 23  GLUCOSE 170*  BUN 20  CREATININE 0.80  CALCIUM 8.6*   Liver Function Tests: Recent Labs  Lab 07/16/17 1310  AST 56*  ALT 41  ALKPHOS 82  BILITOT 0.6  PROT 7.4  ALBUMIN 3.0*   No results for input(s): LIPASE, AMYLASE in the last 168 hours. No results for input(s): AMMONIA in the last 168 hours. CBC: Recent Labs  Lab 07/16/17 1310  WBC 8.1  HGB 12.9  HCT 39.1  MCV 78.8*  PLT 383   Cardiac Enzymes: No results for input(s): CKTOTAL, CKMB,  CKMBINDEX, TROPONINI in the last 168 hours.  BNP (last 3 results) No results for input(s): BNP in the last 8760 hours.  ProBNP (last 3 results) No results for input(s): PROBNP in the last 8760 hours.  CBG: No results for input(s): GLUCAP in the last 168 hours.  Radiological Exams on Admission: Ct Head Wo Contrast  Result Date: 07/16/2017 CLINICAL DATA:  Altered mental per group home. Pt has tenting to skin, nonverbal, more responsive per ems with 300cc bolus of fluids. Hx of dementia, failure to thrive EXAM: CT HEAD WITHOUT CONTRAST TECHNIQUE: Contiguous axial images were obtained from the base of the skull through the vertex without intravenous contrast. COMPARISON:  01/05/2017 FINDINGS: Brain: There is moderate central cortical atrophy. Periventricular white matter changes are consistent with small vessel disease and appear stable. Remote lacunar infarct is identified within the LEFT thalamus, new since the prior study. Stable small periventricular lacunar infarcts. There is encephalomalacia of the RIGHT posterior parietal region. There is no intra or extra-axial fluid collection or mass lesion. The basilar cisterns and ventricles have a normal appearance. There is no CT evidence for acute infarction or hemorrhage. Vascular: There is dense atherosclerotic calcification of the internal carotid arteries. Skull: Normal. Negative for fracture or focal lesion. Sinuses/Orbits: No acute finding. Other: None IMPRESSION: 1.  No evidence for acute intracranial abnormality. 2. Atrophy and small vessel disease. 3. Multiple remote lacunar infarcts without evidence for acute infarct or hemorrhage. Electronically Signed   By: Norva Pavlov M.D.   On: 07/16/2017 14:03   Dg Chest Portable 1 View  Result Date: 07/16/2017 CLINICAL DATA:  Altered mental status, history CHF, hypertension, chronic kidney disease, dementia EXAM: PORTABLE CHEST 1 VIEW COMPARISON:  Portable exam 1308 hours compared to 06/20/2017  FINDINGS: Upper normal heart size. Atherosclerotic calcification aorta. Mediastinal contours and pulmonary vascularity normal. Lungs grossly clear. No acute pulmonary infiltrate, pleural effusion or pneumothorax. Bones demineralized. IMPRESSION: No acute abnormalities. Electronically Signed   By: Ulyses Southward M.D.   On: 07/16/2017 13:36    EKG: Independently reviewed.  Assessment/Plan Principal Problem:   Altered mental status Active Problems:   Dementia   UTI (urinary tract infection)   Hyperglycemia   Will observe on fllor with IV fluids and IV ABX. Follow sugars. MRI of brain ordered. Neurology consult ordered. Consult PT and CSW as well. Recheck labs in AM  Diet: soft Fluids: NS@75  DVT Prophylaxis: SQ Heparin  Code Status: FULL  Family Communication: none  Disposition Plan: SNF  Time spent: 50 min

## 2017-07-16 NOTE — ED Triage Notes (Signed)
Altered mental per group home. Pt has tenting to skin, nonverbal, more responsive per ems with 300cc bolus of fluids. Hx of dementia, failure to thrive, decub on buttocks that she sees wound care for. On antibiotic currently

## 2017-07-16 NOTE — Progress Notes (Signed)
BP 175/82. MD made aware. Order for hydralazine  obtained.

## 2017-07-16 NOTE — ED Notes (Signed)
Mri unable to obtain history from family or dss for mri clearance. xr of orbits, abd and pelvis ordered.

## 2017-07-17 DIAGNOSIS — R4182 Altered mental status, unspecified: Secondary | ICD-10-CM | POA: Diagnosis not present

## 2017-07-17 LAB — CBC
HCT: 38.5 % (ref 35.0–47.0)
Hemoglobin: 12.7 g/dL (ref 12.0–16.0)
MCH: 26.3 pg (ref 26.0–34.0)
MCHC: 33 g/dL (ref 32.0–36.0)
MCV: 79.6 fL — AB (ref 80.0–100.0)
PLATELETS: 307 10*3/uL (ref 150–440)
RBC: 4.84 MIL/uL (ref 3.80–5.20)
RDW: 16.5 % — AB (ref 11.5–14.5)
WBC: 5.8 10*3/uL (ref 3.6–11.0)

## 2017-07-17 LAB — COMPREHENSIVE METABOLIC PANEL
ALT: 33 U/L (ref 14–54)
AST: 41 U/L (ref 15–41)
Albumin: 2.6 g/dL — ABNORMAL LOW (ref 3.5–5.0)
Alkaline Phosphatase: 64 U/L (ref 38–126)
Anion gap: 6 (ref 5–15)
BILIRUBIN TOTAL: 0.5 mg/dL (ref 0.3–1.2)
BUN: 14 mg/dL (ref 6–20)
CALCIUM: 8.3 mg/dL — AB (ref 8.9–10.3)
CO2: 24 mmol/L (ref 22–32)
CREATININE: 0.5 mg/dL (ref 0.44–1.00)
Chloride: 110 mmol/L (ref 101–111)
GFR calc Af Amer: >60 mL/min (ref >60)
Glucose, Bld: 90 mg/dL (ref 65–99)
Potassium: 3.3 mmol/L — ABNORMAL LOW (ref 3.5–5.1)
Sodium: 140 mmol/L (ref 135–145)
TOTAL PROTEIN: 6.3 g/dL — AB (ref 6.5–8.1)

## 2017-07-17 LAB — GLUCOSE, CAPILLARY
GLUCOSE-CAPILLARY: 75 mg/dL (ref 65–99)
Glucose-Capillary: 90 mg/dL (ref 65–99)
Glucose-Capillary: 116 mg/dL — ABNORMAL HIGH (ref 65–99)
Glucose-Capillary: 122 mg/dL — ABNORMAL HIGH (ref 65–99)

## 2017-07-17 MED ORDER — POTASSIUM CHLORIDE CRYS ER 20 MEQ PO TBCR
20.0000 meq | EXTENDED_RELEASE_TABLET | Freq: Two times a day (BID) | ORAL | Status: DC
  Administered 2017-07-17 (×2): 20 meq via ORAL
  Filled 2017-07-17 (×2): qty 1

## 2017-07-17 MED ORDER — FAMOTIDINE 20 MG PO TABS
20.0000 mg | ORAL_TABLET | Freq: Two times a day (BID) | ORAL | Status: DC
  Administered 2017-07-17 – 2017-07-18 (×2): 20 mg via ORAL
  Filled 2017-07-17 (×2): qty 1

## 2017-07-17 NOTE — Consult Note (Signed)
Neuro TEAM PROGRESS NOTE   SUBJECTIVE (INTERVAL HISTORY) No one is at bedside. Patient has end-stage dementia, failure to thrive, CKD, CHF. DM. HTN. All info from chart. It was reported she had AMS, "tenting to skin", was altered but improved with bolus of fluids.    OBJECTIVE Vitals:   07/16/17 2124 07/16/17 2339 07/17/17 0013 07/17/17 0729  BP: (!) 175/82 (!) 147/78 (!) 148/73 (!) 139/94  Pulse: 78 85 88 85  Resp:  19  16  Temp:  97.6 F (36.4 C) (!) 97.5 F (36.4 C) 98.2 F (36.8 C)  TempSrc:  Axillary Axillary Oral  SpO2:  100% 99% 100%  Weight:      Height:        CBC:  Recent Labs  Lab 07/16/17 1310 07/17/17 0428  WBC 8.1 5.8  HGB 12.9 12.7  HCT 39.1 38.5  MCV 78.8* 79.6*  PLT 383 307    Basic Metabolic Panel:  Recent Labs  Lab 07/16/17 1310 07/17/17 0428  NA 139 140  K 3.5 3.3*  CL 108 110  CO2 23 24  GLUCOSE 170* 90  BUN 20 14  CREATININE 0.80 0.50  CALCIUM 8.6* 8.3*    Lipid Panel:     Component Value Date/Time   CHOL 102 11/26/2013 1026   TRIG 51 11/26/2013 1026   HDL 47 11/26/2013 1026   VLDL 10 11/26/2013 1026   LDLCALC 45 11/26/2013 1026   HgbA1c:  Lab Results  Component Value Date   HGBA1C 7.1 (H) 04/28/2013   Urine Drug Screen: No results found for: LABOPIA, COCAINSCRNUR, LABBENZ, AMPHETMU, THCU, LABBARB  Alcohol Level No results found for: [facility]  IMAGING  Dg Eye Foreign Body  Result Date: 07/16/2017 CLINICAL DATA:  Metal working/exposure; clearance prior to MRI EXAM: ORBITS FOR FOREIGN BODY - 2 VIEW COMPARISON:  CT of the head on 07/16/2017 FINDINGS: There is no evidence of metallic foreign body within the orbits. No significant bone abnormality identified. IMPRESSION: No evidence of metallic foreign body within the orbits. Electronically Signed   By: Norva Pavlov M.D.   On: 07/16/2017 18:14   Dg Pelvis 1-2 Views  Result Date: 07/16/2017 CLINICAL DATA:  Patient poor historian at this time. Encounter to evaluate for foreign  bodies prior to MRI. Patient unable to follow directions during imaging exam. Best images obtainable. EXAM: PELVIS - 1-2 VIEW COMPARISON:  None. FINDINGS: Patient is slightly rotated towards the LEFT. There is dense atherosclerotic calcification of the abdominal aorta and its branches. No acute abnormality. Moderate stool burden. No metallic implant or foreign body. IMPRESSION: No evidence for acute  abnormality.  Moderate stool burden. Electronically Signed   By: Norva Pavlov M.D.   On: 07/16/2017 18:13   Dg Abd 1 View  Result Date: 07/16/2017 CLINICAL DATA:  Patient poor historian at this time. Encounter to evaluate for foreign bodies prior to MRI. Patient unable to follow directions during imaging exam. Best images obtainable. EXAM: ABDOMEN - 1 VIEW COMPARISON:  None FINDINGS: Bowel gas pattern is nonobstructive. Moderate stool burden. Note is made of atherosclerotic calcification of the abdominal aorta. No metallic implant or foreign body. IMPRESSION: 1. Moderate stool burden. 2.  No evidence for acute  abnormality. 3. Aortic atherosclerosis.  (ICD10-I70.0) Electronically Signed   By: Norva Pavlov M.D.   On: 07/16/2017 18:11   Ct Head Wo Contrast  Result Date: 07/16/2017 CLINICAL DATA:  Altered mental per group home. Pt has tenting to skin, nonverbal, more responsive per ems with 300cc  bolus of fluids. Hx of dementia, failure to thrive EXAM: CT HEAD WITHOUT CONTRAST TECHNIQUE: Contiguous axial images were obtained from the base of the skull through the vertex without intravenous contrast. COMPARISON:  01/05/2017 FINDINGS: Brain: There is moderate central cortical atrophy. Periventricular white matter changes are consistent with small vessel disease and appear stable. Remote lacunar infarct is identified within the LEFT thalamus, new since the prior study. Stable small periventricular lacunar infarcts. There is encephalomalacia of the RIGHT posterior parietal region. There is no intra or  extra-axial fluid collection or mass lesion. The basilar cisterns and ventricles have a normal appearance. There is no CT evidence for acute infarction or hemorrhage. Vascular: There is dense atherosclerotic calcification of the internal carotid arteries. Skull: Normal. Negative for fracture or focal lesion. Sinuses/Orbits: No acute finding. Other: None IMPRESSION: 1.  No evidence for acute intracranial abnormality. 2. Atrophy and small vessel disease. 3. Multiple remote lacunar infarcts without evidence for acute infarct or hemorrhage. Electronically Signed   By: Norva Pavlov M.D.   On: 07/16/2017 14:03   Mr Brain Wo Contrast  Result Date: 07/16/2017 CLINICAL DATA:  Found nearly unresponsive today. History of dementia, hypertension, diabetes. EXAM: MRI HEAD WITHOUT CONTRAST TECHNIQUE: Axial and coronal diffusion weighted imaging, axial T2 and axial T2 FLAIR. COMPARISON:  CT HEAD Jul 16, 2017 FINDINGS: Severely motion degraded examination. Terminated prior to completion due to patient motion. Brain: No definite reduced diffusion to suggest acute ischemia or hypercellular tumor though motion degrades sensitivity for potential small infarcts. No midline shift. No significant extra-axial fluid collections. Vascular: Nondiagnostic assessment. Skull and upper cervical spine: Nondiagnostic assessment. Sinuses/Orbits: Nondiagnostic assessment. Other: Nondiagnostic assessment. IMPRESSION: 1. Severely motion degraded 4 sequence MRI of the head. 2. No definite acute intracranial process. Electronically Signed   By: Awilda Metro M.D.   On: 07/16/2017 23:10   Dg Chest Portable 1 View  Result Date: 07/16/2017 CLINICAL DATA:  Altered mental status, history CHF, hypertension, chronic kidney disease, dementia EXAM: PORTABLE CHEST 1 VIEW COMPARISON:  Portable exam 1308 hours compared to 06/20/2017 FINDINGS: Upper normal heart size. Atherosclerotic calcification aorta. Mediastinal contours and pulmonary vascularity  normal. Lungs grossly clear. No acute pulmonary infiltrate, pleural effusion or pneumothorax. Bones demineralized. IMPRESSION: No acute abnormalities. Electronically Signed   By: Ulyses Southward M.D.   On: 07/16/2017 13:36       PHYSICAL EXAM Physical exam: Exam: Gen: NAD                    CV: RRR, No peripheral edema, warm, nontender Eyes: Conjunctivae clear without exudates or hemorrhage  Neuro: Detailed Neurologic Exam  Speech:    Speech is without dysarthria  Cognition: Alert, smiling    The patient is not oriented to person, place, and time;     recent and remote memory impaired;     Impaired attention, concentration, fund of knowledge Cranial Nerves:    The pupils are equal, round, and reactive to light. Could not visualize fundi. Visual fields are full to threat. She tracks examiner across the room.  The face appears symmetric. The palate elevates in the midline. Voice is normal.  The tongue has normal motion without fasciculations.   Coordination:    Could not perform due to dementia.     Motor Observation:    generalized atrophy, no involuntary movements noted. Tone:     Could not perform due to non-cooperation and dementia but tone appears increased    Strength:    Moving  all extremities to stim, further testing unable due to dementia     Sensation: withdraws to stim x 4     Reflex Exam:  DTR's:     Could not perform due to dementia.  Toes:    The toes are upgoing bilaterally.    ASSESSMENT/PLAN [name] is a 84 y.o. female with end-stage dementia, failure to thrive, CKD, CHF. DM. HTN. Admitted with alted mental status, "tenting to skin", was altered but improved with bolus of fluids. MRI of the brain showed no acute pathology. Patient is back to baseline today after IV fluids and IV Abx for possible UTI. Etiology was likely dehydration and UTI. Neurology will sign off at this time.    Personally examined patient and images, and have participated in  and made any corrections needed to history, physical, neuro exam,assessment and plan as stated above.  I have personally obtained the history, evaluated lab date, reviewed imaging studies and agree with radiology interpretations.    Naomie Dean, MD  Hospital day # 0

## 2017-07-17 NOTE — Care Management Obs Status (Signed)
MEDICARE OBSERVATION STATUS NOTIFICATION   Patient Details  Name: Jamie Glass MRN: [id number] Date of Birth: 15-Sep-1927   Medicare Observation Status Notification Given:  No(Message left for Legal Gaurdian Saintclair Halsted)    Bevelyn Ngo T, RN 07/17/2017, 1:00 PM

## 2017-07-17 NOTE — Progress Notes (Signed)
PHARMACIST - PHYSICIAN COMMUNICATION  DR:   Elisabeth Pigeon  CONCERNING: IV to Oral Route Change Policy  RECOMMENDATION: This patient is receiving Famotidine by the intravenous route.  Based on criteria approved by the Pharmacy and Therapeutics Committee, the intravenous medication(s) is/are being converted to the equivalent oral dose form(s).   DESCRIPTION: These criteria include:  The patient is eating (either orally or via tube) and/or has been taking other orally administered medications for a least 24 hours  The patient has no evidence of active gastrointestinal bleeding or impaired GI absorption (gastrectomy, short bowel, patient on TNA or NPO).  If you have questions about this conversion, please contact the Pharmacy Department    [phone] )  Jeani Hawking   [phone] )  [facility]   [phone] )  [facility]   [phone] )  [facility]   [phone] )  [facility]   Foye Deer, [facility] 07/17/2017 2:27 PM

## 2017-07-17 NOTE — [place] FL2 (Signed)
[place] MEDICAID FL2 LEVEL OF CARE SCREENING TOOL     IDENTIFICATION  Patient Name: Jamie Glass Birthdate: 1927/04/07 Sex: female Admission Date (Current Location): 07/16/2017  [place] and [place] Number:  Chiropodist and Address:  [facility], [address] [place], [place] [address]      Provider Number: [id number]  Attending Physician Name and Address:  Altamese Dilling, *  Relative Name and Phone Number:     [place] Persons [employer] [phone]  Current Level of Care: Domiciliary (Rest home) Recommended Level of Care: Family Care Home, Assisted Living Facility Prior Approval Number:    Date Approved/Denied:   PASRR Number:    Discharge Plan: Domiciliary (Rest home)    Current Diagnoses: Patient Active Problem List   Diagnosis Date Noted  . Altered mental status 07/16/2017  . Dementia 07/16/2017  . UTI (urinary tract infection) 07/16/2017  . Hyperglycemia 07/16/2017  . Sepsis (HCC) 02/25/2016  . Pressure injury of skin 02/25/2016  . Pressure ulcer 11/03/2014  . Protein-calorie malnutrition, severe (HCC) 11/02/2014  . Acute renal failure (HCC) 10/31/2014  . Hyperkalemia 10/31/2014    Orientation RESPIRATION BLADDER Height & Weight     Self  O2 Incontinent Weight: 120 lb (54.4 kg) Height:   (160 cm)  BEHAVIORAL SYMPTOMS/MOOD NEUROLOGICAL BOWEL NUTRITION STATUS      Incontinent    AMBULATORY STATUS COMMUNICATION OF NEEDS Skin   Extensive Assist Non-Verbally Skin abrasions(bed sore on left buttocks)                       Personal Care Assistance Level of Assistance  Bathing, Feeding, Dressing, Total care Bathing Assistance: Limited assistance Feeding assistance: Limited assistance Dressing Assistance: Limited assistance Total Care Assistance: Limited assistance   Functional Limitations Info  Sight, Hearing, Speech Sight Info: Adequate Hearing Info: Adequate Speech Info: Adequate     SPECIAL CARE FACTORS FREQUENCY  PT (By licensed PT), OT (By licensed OT)     PT Frequency: x5 OT Frequency: x5            Contractures Contractures Info: Not present    Additional Factors Info  Allergies   Allergies Info: Sulpha antibiotics           Current Medications (07/17/2017):  This is the current hospital active medication list Current Facility-Administered Medications  Medication Dose Route Frequency Provider Last Rate Last Dose  . 0.9 % NaCl with KCl 20 mEq/ L  infusion   Intravenous Continuous Marguarite Arbour, MD 75 mL/hr at 07/17/17 0510    . acetaminophen (TYLENOL) tablet 650 mg  650 mg Oral Q6H PRN Marguarite Arbour, MD       Or  . acetaminophen (TYLENOL) suppository 650 mg  650 mg Rectal Q6H PRN Marguarite Arbour, MD      . amLODipine (NORVASC) tablet 10 mg  10 mg Oral Daily Marguarite Arbour, MD      . aspirin EC tablet 81 mg  81 mg Oral Daily Marguarite Arbour, MD      . bisacodyl (DULCOLAX) suppository 10 mg  10 mg Rectal Daily PRN Marguarite Arbour, MD      . cefTRIAXone (ROCEPHIN) 1 g in sodium chloride 0.9 % 100 mL IVPB  1 g Intravenous Q24H Marguarite Arbour, MD   Stopped at 07/16/17 1833  . docusate sodium (COLACE) capsule 100 mg  100 mg Oral BID Marguarite Arbour, MD      .  famotidine (PEPCID) IVPB 20 mg premix  20 mg Intravenous Q12H Marguarite Arbour, MD   Stopped at 07/16/17 2156  . heparin injection 5,000 Units  5,000 Units Subcutaneous Q8H Marguarite Arbour, MD   5,000 Units at 07/17/17 0510  . insulin aspart (novoLOG) injection 0-9 Units  0-9 Units Subcutaneous TID WC Marguarite Arbour, MD   2 Units at 07/16/17 1728  . ipratropium-albuterol (DUONEB) 0.5-2.5 (3) MG/3ML nebulizer solution 3 mL  3 mL Nebulization Q6H PRN Marguarite Arbour, MD      . isosorbide mononitrate (IMDUR) 24 hr tablet 60 mg  60 mg Oral Daily Marguarite Arbour, MD      . metoprolol tartrate (LOPRESSOR) tablet 25 mg  25 mg Oral BID Marguarite Arbour, MD      . OLANZapine  (ZYPREXA) tablet 5 mg  5 mg Oral QHS Marguarite Arbour, MD      . ondansetron [facility]) tablet 4 mg  4 mg Oral Q6H PRN Marguarite Arbour, MD       Or  . ondansetron (ZOFRAN) injection 4 mg  4 mg Intravenous Q6H PRN Marguarite Arbour, MD      . pravastatin (PRAVACHOL) tablet 20 mg  20 mg Oral Daily Marguarite Arbour, MD         Discharge Medications: Please see discharge summary for a list of discharge medications.  Relevant Imaging Results:  Relevant Lab Results:   Additional Information SSN [id number]  Cheron Schaumann, [place]

## 2017-07-17 NOTE — Progress Notes (Signed)
[facility] [place] [facility]   PATIENT NAME: Jamie Glass    MR#:  [id number]  DATE OF BIRTH:  [birth year]  SUBJECTIVE:  CHIEF COMPLAINT:   Chief Complaint  Patient presents with  . Altered Mental Status   Brought to the hospital with altered mental status, has baseline dementia. She was found to have no infections and MRI of the brain was negative. Received some IV fluid and appears alert but happily confused today. I tried calling her son, daughter, and legal guardian- all the numbers in her chart and did not get any response from any of them.  REVIEW OF SYSTEMS:  Patient is having dementia and cannot give a review of system.  ROS  DRUG ALLERGIES:   Allergies  Allergen Reactions  . Sulfa Antibiotics Other (See Comments)    Unknown reaction- listed on MAR.    VITALS:  Blood pressure (!) 139/94, pulse 85, temperature 98.2 F (36.8 C), temperature source Oral, resp. rate 16, height  (1.6 m), weight 54.4 kg (120 lb), SpO2 100 %.  PHYSICAL EXAMINATION:  GENERAL:  84 y.o.-year-old thin patient lying in the bed with no acute distress.  EYES: Pupils equal, round, reactive to light and accommodation. No scleral icterus. Extraocular muscles intact.  HEENT: Head atraumatic, normocephalic. Oropharynx and nasopharynx clear.  NECK:  Supple, no jugular venous distention. No thyroid enlargement, no tenderness.  LUNGS: Normal breath sounds bilaterally, no wheezing, rales,rhonchi or crepitation. No use of accessory muscles of respiration.  CARDIOVASCULAR: S1, S2 normal. No murmurs, rubs, or gallops.  ABDOMEN: Soft, nontender, nondistended. Bowel sounds present. No organomegaly or mass.  EXTREMITIES: No pedal edema, cyanosis, or clubbing.  NEUROLOGIC: Cranial nerves II through XII are intact. Muscle strength 4/5 in all extremities. Sensation intact. Gait not checked.  PSYCHIATRIC: The patient is alert and oriented x 0.  SKIN: No obvious rash, lesion, or ulcer.    Physical Exam LABORATORY PANEL:   CBC Recent Labs  Lab 07/17/17 0428  WBC 5.8  HGB 12.7  HCT 38.5  PLT 307   ------------------------------------------------------------------------------------------------------------------  Chemistries  Recent Labs  Lab 07/17/17 0428  NA 140  K 3.3*  CL 110  CO2 24  GLUCOSE 90  BUN 14  CREATININE 0.50  CALCIUM 8.3*  AST 41  ALT 33  ALKPHOS 64  BILITOT 0.5   ------------------------------------------------------------------------------------------------------------------  Cardiac Enzymes No results for input(s): TROPONINI in the last 168 hours. ------------------------------------------------------------------------------------------------------------------  RADIOLOGY:  Dg Eye Foreign Body  Result Date: 07/16/2017 CLINICAL DATA:  Metal working/exposure; clearance prior to MRI EXAM: ORBITS FOR FOREIGN BODY - 2 VIEW COMPARISON:  CT of the head on 07/16/2017 FINDINGS: There is no evidence of metallic foreign body within the orbits. No significant bone abnormality identified. IMPRESSION: No evidence of metallic foreign body within the orbits. Electronically Signed   By: Norva Pavlov M.D.   On: 07/16/2017 18:14   Dg Pelvis 1-2 Views  Result Date: 07/16/2017 CLINICAL DATA:  Patient poor historian at this time. Encounter to evaluate for foreign bodies prior to MRI. Patient unable to follow directions during imaging exam. Best images obtainable. EXAM: PELVIS - 1-2 VIEW COMPARISON:  None. FINDINGS: Patient is slightly rotated towards the LEFT. There is dense atherosclerotic calcification of the abdominal aorta and its branches. No acute abnormality. Moderate stool burden. No metallic implant or foreign body. IMPRESSION: No evidence for acute  abnormality.  Moderate stool burden. Electronically Signed   By: Norva Pavlov M.D.   On: 07/16/2017 18:13  Dg Abd 1 View  Result Date: 07/16/2017 CLINICAL DATA:  Patient poor historian at this  time. Encounter to evaluate for foreign bodies prior to MRI. Patient unable to follow directions during imaging exam. Best images obtainable. EXAM: ABDOMEN - 1 VIEW COMPARISON:  None FINDINGS: Bowel gas pattern is nonobstructive. Moderate stool burden. Note is made of atherosclerotic calcification of the abdominal aorta. No metallic implant or foreign body. IMPRESSION: 1. Moderate stool burden. 2.  No evidence for acute  abnormality. 3. Aortic atherosclerosis.  (ICD10-I70.0) Electronically Signed   By: Norva Pavlov M.D.   On: 07/16/2017 18:11   Ct Head Wo Contrast  Result Date: 07/16/2017 CLINICAL DATA:  Altered mental per group home. Pt has tenting to skin, nonverbal, more responsive per ems with 300cc bolus of fluids. Hx of dementia, failure to thrive EXAM: CT HEAD WITHOUT CONTRAST TECHNIQUE: Contiguous axial images were obtained from the base of the skull through the vertex without intravenous contrast. COMPARISON:  01/05/2017 FINDINGS: Brain: There is moderate central cortical atrophy. Periventricular white matter changes are consistent with small vessel disease and appear stable. Remote lacunar infarct is identified within the LEFT thalamus, new since the prior study. Stable small periventricular lacunar infarcts. There is encephalomalacia of the RIGHT posterior parietal region. There is no intra or extra-axial fluid collection or mass lesion. The basilar cisterns and ventricles have a normal appearance. There is no CT evidence for acute infarction or hemorrhage. Vascular: There is dense atherosclerotic calcification of the internal carotid arteries. Skull: Normal. Negative for fracture or focal lesion. Sinuses/Orbits: No acute finding. Other: None IMPRESSION: 1.  No evidence for acute intracranial abnormality. 2. Atrophy and small vessel disease. 3. Multiple remote lacunar infarcts without evidence for acute infarct or hemorrhage. Electronically Signed   By: Norva Pavlov M.D.   On: 07/16/2017 14:03    Mr Brain Wo Contrast  Result Date: 07/16/2017 CLINICAL DATA:  Found nearly unresponsive today. History of dementia, hypertension, diabetes. EXAM: MRI HEAD WITHOUT CONTRAST TECHNIQUE: Axial and coronal diffusion weighted imaging, axial T2 and axial T2 FLAIR. COMPARISON:  CT HEAD Jul 16, 2017 FINDINGS: Severely motion degraded examination. Terminated prior to completion due to patient motion. Brain: No definite reduced diffusion to suggest acute ischemia or hypercellular tumor though motion degrades sensitivity for potential small infarcts. No midline shift. No significant extra-axial fluid collections. Vascular: Nondiagnostic assessment. Skull and upper cervical spine: Nondiagnostic assessment. Sinuses/Orbits: Nondiagnostic assessment. Other: Nondiagnostic assessment. IMPRESSION: 1. Severely motion degraded 4 sequence MRI of the head. 2. No definite acute intracranial process. Electronically Signed   By: Awilda Metro M.D.   On: 07/16/2017 23:10   Dg Chest Portable 1 View  Result Date: 07/16/2017 CLINICAL DATA:  Altered mental status, history CHF, hypertension, chronic kidney disease, dementia EXAM: PORTABLE CHEST 1 VIEW COMPARISON:  Portable exam 1308 hours compared to 06/20/2017 FINDINGS: Upper normal heart size. Atherosclerotic calcification aorta. Mediastinal contours and pulmonary vascularity normal. Lungs grossly clear. No acute pulmonary infiltrate, pleural effusion or pneumothorax. Bones demineralized. IMPRESSION: No acute abnormalities. Electronically Signed   By: Ulyses Southward M.D.   On: 07/16/2017 13:36    ASSESSMENT AND PLAN:   Principal Problem:   Altered mental status Active Problems:   Dementia   UTI (urinary tract infection)   Hyperglycemia  * Altered mental status   Likely dehydration and progressive dementia.   After IV fluid patient much improved.   UTI was suspected on admission but her level results are negative.- Stop ceftriaxone.   MRI of  the brain is negative.    Appreciate help by neurologist. No further workup needed.   I tried calling patient's family and legal guardian today, no response on the phone numbers noted in her records. I wanted to discuss with them about patient's baseline status, living condition and need for help at home, advance care planning.   I will call again tomorrow and social worker to help with arrangements.  * Hypertension  Continue home medications.  * Hyperlipidemia   Continue pravastatin.  All the records are reviewed and case discussed with Care Management/Social Workerr. Management plans discussed with the patient, family and they are in agreement.  CODE STATUS: Full.  TOTAL TIME TAKING CARE OF THIS PATIENT: 35 minutes.     POSSIBLE D/C IN 1-2 DAYS, DEPENDING ON CLINICAL CONDITION.   Altamese Dilling M.D on 07/17/2017   Between 7am to 6pm - Pager - [phone]  After 6pm go to [email or web address] - password [email or web address]  [facility]  Office  [phone]  CC: Primary care physician; Gracelyn Nurse, MD  Note: This dictation was prepared with Dragon dictation along with smaller phrase technology. Any transcriptional errors that result from this process are unintentional.

## 2017-07-17 NOTE — Clinical Social Work Note (Signed)
Clinical Social Work Assessment  Patient Details  Name: Jamie Glass MRN: [id number] Date of Birth: 05-18-1927  Date of referralPriscilla Glass               Reason for consult:  Other (Comment Required)(From and [facility])                Permission sought to share information with:  Guardian, Magazine features editor Permission granted to share information::  Yes, Verbal Permission Granted  Name::     Jamie Glass [phone]   Agency::  Jamie Glass [facility] Provider [phone]  Relationship::     Contact Information:     Housing/Transportation Living arrangements for the past 2 months:  Group Home, Assisted Living Facility Source of Information:  Medical Team, Facility Patient Interpreter Needed:  None Criminal Activity/Legal Involvement Pertinent to Current Situation/Hospitalization:  No - Comment as needed Significant Relationships:  Adult Children Lives with:  Facility Resident Do you feel safe going back to the place where you live?  Yes Need for family participation in patient care:  Yes (Comment)  Care giving concerns: Patient is able to return to [facility] and has in home PT and OT by [facility]   Social Worker assessment / plan: LCSW was unable to assess patient to due dementia and altered mental status. Her Guardian is Jamie Glass from DSS annd she has been notified. Called patients [facility] Jamie Glass to collect information on patient.Jamie Glass is a 84 y.o. female has a past medical history significant for HTN, CHF, DM, and dementia now with worsening confusion and failure to thrive. Non-verbal and non-ambulatory and not eating. Patient has a son who has full access- daughter can visit but not allowed as per  [facility] to take her mother on outings ( as per pts Guardian) Patient is able to return to [facility] and has in home PT and OT provided by [facility] services.  Employment status:  Retired Health and safety inspector:  Medicare([employer]) PT Recommendations:  Not assessed at  this time, Outpatient Therapies(Patient is attached to [facility] with PT and OT) Information / Referral to community resources:   none provided  Patient/Family's Response to care:  Guardian aware patient is admitted  Patient/Family's Understanding of and Emotional Response to Diagnosis, Current Treatment, and Prognosis: Good understanding  Emotional Assessment Appearance:  Appears stated age Attitude/Demeanor/Rapport:  Unable to Assess Affect (typically observed):  Unable to Assess Orientation:  Oriented to Self Alcohol / Substance use:  Not Applicable Psych involvement (Current and /or in the community):  No (Comment)  Discharge Needs  Concerns to be addressed:   none Readmission within the last 30 days:   no Current discharge risk:  None Barriers to Discharge:  No Barriers Identified   Jamie Schaumann, LCSW 07/17/2017, 8:27 AM

## 2017-07-17 NOTE — Progress Notes (Signed)
LCSW will contact facility and complete assessment and documentation by Mid morning.  [employer] LCSW [phone]

## 2017-07-17 NOTE — Progress Notes (Signed)
LCSW met with patient who was alert and introduced myself and wished her a Happy Mothers day and wished her better health from [facility] her [place] Provider and myself. Patient was responsive and thanked this worker for brief visit.  [employer] LCSW [phone]

## 2017-07-18 ENCOUNTER — Observation Stay: Payer: Medicare Other

## 2017-07-18 LAB — GLUCOSE, CAPILLARY
GLUCOSE-CAPILLARY: 122 mg/dL — AB (ref 65–99)
GLUCOSE-CAPILLARY: 147 mg/dL — AB (ref 65–99)
GLUCOSE-CAPILLARY: 82 mg/dL (ref 65–99)
Glucose-Capillary: 102 mg/dL — ABNORMAL HIGH (ref 65–99)
Glucose-Capillary: 126 mg/dL — ABNORMAL HIGH (ref 65–99)

## 2017-07-18 MED ORDER — POTASSIUM CHLORIDE CRYS ER 20 MEQ PO TBCR
40.0000 meq | EXTENDED_RELEASE_TABLET | Freq: Once | ORAL | Status: AC
  Administered 2017-07-18: 40 meq via ORAL
  Filled 2017-07-18: qty 2

## 2017-07-18 MED ORDER — PANTOPRAZOLE SODIUM 40 MG PO TBEC
40.0000 mg | DELAYED_RELEASE_TABLET | Freq: Every day | ORAL | Status: DC
  Administered 2017-07-20: 40 mg via ORAL
  Filled 2017-07-18 (×2): qty 1

## 2017-07-18 MED ORDER — AMOXICILLIN-POT CLAVULANATE 875-125 MG PO TABS
1.0000 | ORAL_TABLET | Freq: Two times a day (BID) | ORAL | Status: DC
  Administered 2017-07-18 – 2017-07-20 (×5): 1 via ORAL
  Filled 2017-07-18 (×5): qty 1

## 2017-07-18 NOTE — Plan of Care (Signed)
  Problem: Education: Goal: Knowledge of General Education information will improve 07/18/2017 0429 by Roxana Hires, RN Outcome: Progressing 07/18/2017 0429 by Jasen Hartstein, Genelle Gather, RN Outcome: Progressing   Problem: Health Behavior/Discharge Planning: Goal: Ability to manage health-related needs will improve 07/18/2017 0429 by Trip Cavanagh, Genelle Gather, RN Outcome: Progressing 07/18/2017 0429 by Roxana Hires, RN Outcome: Progressing   Problem: Clinical Measurements: Goal: Ability to maintain clinical measurements within normal limits will improve 07/18/2017 0429 by Jerid Catherman, Genelle Gather, RN Outcome: Progressing 07/18/2017 0429 by Roxana Hires, RN Outcome: Progressing Goal: Will remain free from infection 07/18/2017 0429 by Roxana Hires, RN Outcome: Progressing 07/18/2017 0429 by Roxana Hires, RN Outcome: Progressing Goal: Diagnostic test results will improve 07/18/2017 0429 by Shaniquia Brafford, Genelle Gather, RN Outcome: Progressing 07/18/2017 0429 by Roxana Hires, RN Outcome: Progressing Goal: Respiratory complications will improve 07/18/2017 0429 by Roxana Hires, RN Outcome: Progressing 07/18/2017 0429 by Roxana Hires, RN Outcome: Progressing Goal: Cardiovascular complication will be avoided 07/18/2017 0429 by Roxana Hires, RN Outcome: Progressing 07/18/2017 0429 by Roxana Hires, RN Outcome: Progressing

## 2017-07-18 NOTE — Plan of Care (Signed)
  Problem: Education: Goal: Knowledge of General Education information will improve Outcome: Progressing   Problem: Health Behavior/Discharge Planning: Goal: Ability to manage health-related needs will improve Outcome: Progressing   Problem: Clinical Measurements: Goal: Ability to maintain clinical measurements within normal limits will improve Outcome: Progressing Goal: Will remain free from infection Outcome: Progressing Goal: Diagnostic test results will improve Outcome: Progressing Goal: Respiratory complications will improve Outcome: Progressing Goal: Cardiovascular complication will be avoided Outcome: Progressing   Problem: Nutrition: Goal: Adequate nutrition will be maintained Outcome: Progressing   Problem: Activity: Goal: Risk for activity intolerance will decrease Outcome: Progressing   Problem: Coping: Goal: Level of anxiety will decrease Outcome: Progressing   Problem: Elimination: Goal: Will not experience complications related to bowel motility Outcome: Progressing Goal: Will not experience complications related to urinary retention Outcome: Progressing   

## 2017-07-18 NOTE — Care Management Obs Status (Signed)
MEDICARE OBSERVATION STATUS NOTIFICATION   Patient Details  Name: Jamie Glass MRN: [id number] Date of Birth: May 06, 1927   Medicare Observation Status Notification Given:  Yes    Marily Memos, RN 07/18/2017, 8:58 AM

## 2017-07-18 NOTE — Progress Notes (Signed)
[employer] -  at [facility]   PATIENT NAME: Jamie Glass    MR#:  [id number]  DATE OF BIRTH:  01-Aug-1927  SUBJECTIVE:  CHIEF COMPLAINT:   Chief Complaint  Patient presents with  . Altered Mental Status   Brought to the hospital with altered mental status, has baseline dementia. She was found to have no infections and MRI of the brain was negative. Received some IV fluid and appears alert but happily confused today. Today contacted pt's Legal guardian and her son, they said- she is not walking at baseline, and need help with feeding. Have a bad decubetus ulcer and on Abx for that.  REVIEW OF SYSTEMS:  Patient is having dementia and cannot give a review of system.  ROS  DRUG ALLERGIES:   Allergies  Allergen Reactions  . Sulfa Antibiotics Other (See Comments)    Unknown reaction- listed on MAR.    VITALS:  Blood pressure (!) 145/85, pulse 66, temperature 97.6 F (36.4 C), temperature source Oral, resp. rate 20, height  (1.6 m), weight 53.8 kg (118 lb 8 oz), SpO2 100 %.  PHYSICAL EXAMINATION:  GENERAL:  84 y.o.-year-old thin patient lying in the bed with no acute distress.  EYES: Pupils equal, round, reactive to light and accommodation. No scleral icterus. Extraocular muscles intact.  HEENT: Head atraumatic, normocephalic. Oropharynx and nasopharynx clear.  NECK:  Supple, no jugular venous distention. No thyroid enlargement, no tenderness.  LUNGS: Normal breath sounds bilaterally, no wheezing, rales,rhonchi or crepitation. No use of accessory muscles of respiration.  CARDIOVASCULAR: S1, S2 normal. No murmurs, rubs, or gallops.  ABDOMEN: Soft, nontender, nondistended. Bowel sounds present. No organomegaly or mass.  EXTREMITIES: No pedal edema, cyanosis, or clubbing.  NEUROLOGIC: Cranial nerves II through XII are intact. Muscle strength 3/5 in all extremities. Sensation and Gait not checked. Pt does not follow commands. PSYCHIATRIC: The patient is  alert and oriented x 0.  SKIN: No obvious rash, has a bad decubetus ulcer with discharge on sacrum.  Physical Exam LABORATORY PANEL:   CBC Recent Labs  Lab 07/17/17 0428  WBC 5.8  HGB 12.7  HCT 38.5  PLT 307   ------------------------------------------------------------------------------------------------------------------  Chemistries  Recent Labs  Lab 07/17/17 0428  NA 140  K 3.3*  CL 110  CO2 24  GLUCOSE 90  BUN 14  CREATININE 0.50  CALCIUM 8.3*  AST 41  ALT 33  ALKPHOS 64  BILITOT 0.5   ------------------------------------------------------------------------------------------------------------------  Cardiac Enzymes No results for input(s): TROPONINI in the last 168 hours. ------------------------------------------------------------------------------------------------------------------  RADIOLOGY:  Dg Eye Foreign Body  Result Date: 07/16/2017 CLINICAL DATA:  Metal working/exposure; clearance prior to MRI EXAM: ORBITS FOR FOREIGN BODY - 2 VIEW COMPARISON:  CT of the head on 07/16/2017 FINDINGS: There is no evidence of metallic foreign body within the orbits. No significant bone abnormality identified. IMPRESSION: No evidence of metallic foreign body within the orbits. Electronically Signed   By: Norva Pavlov M.D.   On: 07/16/2017 18:14   Dg Pelvis 1-2 Views  Result Date: 07/16/2017 CLINICAL DATA:  Patient poor historian at this time. Encounter to evaluate for foreign bodies prior to MRI. Patient unable to follow directions during imaging exam. Best images obtainable. EXAM: PELVIS - 1-2 VIEW COMPARISON:  None. FINDINGS: Patient is slightly rotated towards the LEFT. There is dense atherosclerotic calcification of the abdominal aorta and its branches. No acute abnormality. Moderate stool burden. No metallic implant or foreign body. IMPRESSION: No evidence for acute  abnormality.  Moderate stool burden. Electronically Signed   By: Norva Pavlov M.D.   On:  07/16/2017 18:13   Dg Abd 1 View  Result Date: 07/16/2017 CLINICAL DATA:  Patient poor historian at this time. Encounter to evaluate for foreign bodies prior to MRI. Patient unable to follow directions during imaging exam. Best images obtainable. EXAM: ABDOMEN - 1 VIEW COMPARISON:  None FINDINGS: Bowel gas pattern is nonobstructive. Moderate stool burden. Note is made of atherosclerotic calcification of the abdominal aorta. No metallic implant or foreign body. IMPRESSION: 1. Moderate stool burden. 2.  No evidence for acute  abnormality. 3. Aortic atherosclerosis.  (ICD10-I70.0) Electronically Signed   By: Norva Pavlov M.D.   On: 07/16/2017 18:11   Mr Brain Wo Contrast  Result Date: 07/16/2017 CLINICAL DATA:  Found nearly unresponsive today. History of dementia, hypertension, diabetes. EXAM: MRI HEAD WITHOUT CONTRAST TECHNIQUE: Axial and coronal diffusion weighted imaging, axial T2 and axial T2 FLAIR. COMPARISON:  CT HEAD Jul 16, 2017 FINDINGS: Severely motion degraded examination. Terminated prior to completion due to patient motion. Brain: No definite reduced diffusion to suggest acute ischemia or hypercellular tumor though motion degrades sensitivity for potential small infarcts. No midline shift. No significant extra-axial fluid collections. Vascular: Nondiagnostic assessment. Skull and upper cervical spine: Nondiagnostic assessment. Sinuses/Orbits: Nondiagnostic assessment. Other: Nondiagnostic assessment. IMPRESSION: 1. Severely motion degraded 4 sequence MRI of the head. 2. No definite acute intracranial process. Electronically Signed   By: Awilda Metro M.D.   On: 07/16/2017 23:10    ASSESSMENT AND PLAN:   Principal Problem:   Altered mental status Active Problems:   Dementia   UTI (urinary tract infection)   Hyperglycemia  * Altered mental status   Likely dehydration and progressive dementia.   After IV fluid patient much improved.   UTI was suspected on admission but her level  results are negative.- Stop ceftriaxone.   MRI of the brain is negative.   Appreciate help by neurologist. No further workup needed.   After discussion with legal guardian- she may be abel to go back to her facility with home health.  * Decubetus ulcer with infection   On oral augmentin.   Will get wound care nurse to check and get xray sacrum to r/o OM>  * Hypertension  Continue home medications.  * Hyperlipidemia   Continue pravastatin.  * hypokalemia   Replace oral.  All the records are reviewed and case discussed with Care Management/Social Workerr. Management plans discussed with the patient, family and they are in agreement.  CODE STATUS: Full.  TOTAL TIME TAKING CARE OF THIS PATIENT: 35 minutes.    POSSIBLE D/C IN 1-2 DAYS, DEPENDING ON CLINICAL CONDITION.   Altamese Dilling M.D on 07/18/2017   Between 7am to 6pm - Pager - [phone]  After 6pm go to [email or web address] - password [email or web address]  [facility]  Office  [phone]  CC: Primary care physician; Gracelyn Nurse, MD  Note: This dictation was prepared with Dragon dictation along with smaller phrase technology. Any transcriptional errors that result from this process are unintentional.

## 2017-07-18 NOTE — Evaluation (Signed)
Physical Therapy Evaluation Patient Details Name: Jamie Glass MRN: [id number] DOB: [birth year] Today's Date: 07/18/2017   History of Present Illness  presented to ER secondary to AMS, failure to thrive; admitted due to dehydration.   Initially suspected with UTI; labwork negative.  Clinical Impression  Patient alert and oriented to self only; speech generally unintelligible, limited ability to actively/consistently follow commands.  Generally weak and deconditioned throughout all extremities with noted difficulty coordinating purposeful initiation/termination of musculature. Lacks full ROM at bilat elbows, hands/fingers and ankles.  Currently requiring max/total assist for bed mobility and repositioning; max/total assist for unsupported sitting balance.  Very heavy posterior trunk lean with absent balance/righting reactions. Unsafe/unable to progress towards OOB/transfers at this time. Would benefit from skilled PT to address above deficits and promote optimal return to PLOF, pending ability to actively participate/progress with skilled intervention; Recommend transition to HHPT upon discharge from acute hospitalization, as patient likely to progress best in familiar environment.  Do not feel patient able to tolerate/participate with intensity of STR. May consider transition to LTC for higher level of care as a long-term plan if group home unable to meet/maintain needs.      Follow Up Recommendations Home health PT(pending ability to actively participate/progress)    Equipment Recommendations       Recommendations for Other Services       Precautions / Restrictions Precautions Precautions: Fall Restrictions Weight Bearing Restrictions: No      Mobility  Bed Mobility Overal bed mobility: Needs Assistance Bed Mobility: Rolling;Supine to Sit;Sit to Supine Rolling: Total assist;Max assist   Supine to sit: Max assist Sit to supine: Total assist      Transfers                  General transfer comment: unsafe/unable  Ambulation/Gait             General Gait Details: unsafe/unable  Stairs            Wheelchair Mobility    Modified Rankin (Stroke Patients Only)       Balance Overall balance assessment: Needs assistance Sitting-balance support: Feet supported;No upper extremity supported Sitting balance-Leahy Scale: Zero         Standing balance comment: unsafe/unable                             Pertinent Vitals/Pain Pain Assessment: Faces Faces Pain Scale: No hurt    Home Living Family/patient expects to be discharged to:: Group home                 Additional Comments: Per chart, patient resident of family care home.  Attempted to phone facility (at two separate numbers); unable to reach personnel to discuss environmental set up and PLOF.    Prior Function           Comments: Per documentation, patient primarily WC level with assist from staff for all basic transfers; feeds self with hands (finger foods, unable to utilize utensils) and intermittently answers yes/no questions.  Will verify with guardian/facility as available.     Hand Dominance        Extremity/Trunk Assessment   Upper Extremity Assessment Upper Extremity Assessment: Generalized weakness(difficulty with purposeful activation/termination of all musculature; lacking full elbow extension, prefers bilat hands in fisted position (R middle finger, L index finger in fixed, flexed position))    Lower Extremity Assessment Lower Extremity Assessment: Generalized weakness(difficulty with purposeful activation/termination of all musculature (  feels like moderate tone with act assist movement); prefers to maintain LEs in crossed position; bilat ankles lacking approx 20 degrees DF (from neutral))       Communication   Communication: HOH  Cognition Arousal/Alertness: Awake/alert Behavior During Therapy: Flat affect Overall Cognitive Status: No  family/caregiver present to determine baseline cognitive functioning                                 General Comments: limited ability to follow commands and actively participate with session      General Comments      Exercises Other Exercises Other Exercises: Attempted act assist/passive UE/LE therex-patient with difficulty actively coordinating LEs for purposeful movement at times. Other Exercises: Attempted weight shifting activities in unsupported sitting-presents with heavy posterior lean, absent balance/righting reactions.  Patient somewhat resistant to weight shifting facilitation; unable to follow/respond to commands or cues for purposeful correction.   Assessment/Plan    PT Assessment Patient needs continued PT services  PT Problem List Decreased strength;Decreased range of motion;Decreased cognition;Decreased balance;Decreased activity tolerance;Decreased mobility;Decreased coordination;Decreased knowledge of use of DME;Decreased safety awareness;Decreased knowledge of precautions       PT Treatment Interventions DME instruction;Functional mobility training;Therapeutic activities;Therapeutic exercise;Balance training;Patient/family education    PT Goals (Current goals can be found in the Care Plan section)  Acute Rehab PT Goals PT Goal Formulation: Patient unable to participate in goal setting Time For Goal Achievement: 08/01/17 Potential to Achieve Goals: Fair    Frequency Min 2X/week   Barriers to discharge Decreased caregiver support      Co-evaluation               AM-PAC PT "6 Clicks" Daily Activity  Outcome Measure Difficulty turning over in bed (including adjusting bedclothes, sheets and blankets)?: Unable Difficulty moving from lying on back to sitting on the side of the bed? : Unable Difficulty sitting down on and standing up from a chair with arms (e.g., wheelchair, bedside commode, etc,.)?: Unable Help needed moving to and from a bed  to chair (including a wheelchair)?: Total Help needed walking in hospital room?: Total Help needed climbing 3-5 steps with a railing? : Total 6 Click Score: 6    End of Session Equipment Utilized During Treatment: Gait belt Activity Tolerance: (limited by cognition) Patient left: in bed;with call bell/phone within reach;with bed alarm set Nurse Communication: Mobility status PT Visit Diagnosis: Difficulty in walking, not elsewhere classified (R26.2);Muscle weakness (generalized) (M62.81)    Time: 4098-1191 PT Time Calculation (min) (ACUTE ONLY): 18 min   Charges:   PT Evaluation $PT Eval Moderate Complexity: 1 Mod PT Treatments $Therapeutic Activity: 8-22 mins   PT G Codes:        Loletha Bertini H. Manson Passey, PT, DPT, NCS 07/18/17, 2:42 PM [phone]

## 2017-07-18 NOTE — Progress Notes (Signed)
Clinical Child psychotherapist (CSW) contacted patient's [place] DSS guardian Saintclair Halsted and made her aware that PT is recommending home health because she is not rehab appropriate. Per Rhesha patient does not walk at baseline and she is working on nursing home placement under private pay. Per Rhesha patient can D/C back to the family care home from [facility] and she will continue to work on a higher level of care.   [employer], LCSW [phone]

## 2017-07-18 NOTE — Care Management (Signed)
Spoke with  legal guardian, Caleen Essex by phone to present MOON. Daphene Jaeger feels patient may need a higher level of care. She states patient is from [facility], administrator is Benjamine Sprague [phone]. CSW updated.

## 2017-07-19 LAB — CBC
HCT: 37.9 % (ref 35.0–47.0)
Hemoglobin: 12.4 g/dL (ref 12.0–16.0)
MCH: 25.8 pg — ABNORMAL LOW (ref 26.0–34.0)
MCHC: 32.8 g/dL (ref 32.0–36.0)
MCV: 78.7 fL — ABNORMAL LOW (ref 80.0–100.0)
Platelets: 324 10*3/uL (ref 150–440)
RBC: 4.82 MIL/uL (ref 3.80–5.20)
RDW: 16.4 % — AB (ref 11.5–14.5)
WBC: 5.2 10*3/uL (ref 3.6–11.0)

## 2017-07-19 LAB — BASIC METABOLIC PANEL
ANION GAP: 8 (ref 5–15)
BUN: 7 mg/dL (ref 6–20)
CALCIUM: 8.5 mg/dL — AB (ref 8.9–10.3)
CO2: 22 mmol/L (ref 22–32)
Chloride: 106 mmol/L (ref 101–111)
Creatinine, Ser: 0.56 mg/dL (ref 0.44–1.00)
Glucose, Bld: 91 mg/dL (ref 65–99)
Potassium: 4 mmol/L (ref 3.5–5.1)
SODIUM: 136 mmol/L (ref 135–145)

## 2017-07-19 LAB — GLUCOSE, CAPILLARY
GLUCOSE-CAPILLARY: 81 mg/dL (ref 65–99)
GLUCOSE-CAPILLARY: 85 mg/dL (ref 65–99)
GLUCOSE-CAPILLARY: 91 mg/dL (ref 65–99)
Glucose-Capillary: 96 mg/dL (ref 65–99)

## 2017-07-19 NOTE — Consult Note (Signed)
WOC Nurse wound consult note Reason for Consult: Pressure injuries to right shoulder, left heel and right sacrum Wound type:Pressure Pressure Injury POA: Yes Measurement: Right sacrum:  2.8cm x 5cm x 1.4cm with 75% red wound bed, 20% white, 5% black., small amount of serous drainage. Right shoulder:  2cm round x -.1cm healing stage 3 Pressure injury.  Dry, red wound bed. Left heel:  1cm round x 0.2cm Stage 3 pressure injury with yellow wound bed, serous drainage.  Wound bed:As described above Drainage (amount, consistency, odor) As described above Periwound: intact.  Perineal area (and buttocks) are somewhat moist and macerated from urinary incontinence. Dressing procedure/placement/frequency: I will place patient today on a mattress replacement for microclimate management as well as pressure redistribution.  Heel boots will be provided.  Topical wound care to the Stage 3 pressure injury on the right sacrum will be with a calcium alginate as a wound filler, topped with dry gauze. The right shoulder Stage 3 pressure injury is healing and a silicone foam dressing will be the treatment.  The left heel Stage 3 pressure injury will be treated with white petrolatum (Vaseline) gauze covered and secured with a kerlix roll gauze.  I have suggested that urinary incontinence management with a powered external urinary incontinence device (PurWick) may be preferred over diapers while in house.  WOC nursing team will not follow, but will remain available to this patient, the nursing and medical teams.  Please re-consult if needed. Thanks, Ladona Mow, MSN, RN, GNP, [place]  Pager# [phone]

## 2017-07-19 NOTE — Progress Notes (Signed)
CBG 81 per Dois Davenport NT. Glucometer not synching yet.

## 2017-07-19 NOTE — Progress Notes (Signed)
Family Meeting Note  Advance Directive:no  Today a meeting took place with the legal guardian..  Patient is unable to participate due ZO:XWRUEA capacity dementia   The following clinical team members were present during this meeting:MD  The following were discussed:Patient's diagnosis: dementia, bedbound, Multiple pressure ulcers, decreased oral intake- need help feeding, Patient's progosis: < 12 months and Goals for treatment: Continue present management  I had discussed about patient's very poor overall health status and quality of life with her guardian,mentioned to her about her terminal dementia, completely bed bound status, multiple pressure ulcers, decreased oral intake and needing help with feeding every time- this makes her at very high r of having recurrent infections and complications like aspiration, urinary tract infection, skin infections and cellulitis, also make her high risk for recurrent dehydration and electrolyte imbalances. And all these conditions are likely to happen recurrently, Due to patient's very poor overall quality of life, and possibly less than one year of life expectancy- I strongly recommend to not to do resuscitation if she has cardiorespiratory arrest.  Guardian will think about this and make further decisions.  Additional follow-up to be provided: PMD  Time spent during discussion:20 minutes  Altamese Dilling, MD

## 2017-07-19 NOTE — Progress Notes (Signed)
Took over care of pt from [place], [place]. Pt alert but confused resting in bed. Pt on 3L O2 chronic. No complaints from pt at this time. Pt on low bed. Will continue to monitor.

## 2017-07-19 NOTE — Progress Notes (Addendum)
Per nurse patient needs daily wound care dressing changes. Patient can go to SNF for wound care. FL2 complete and faxed out. CSW left voicemails for DSS guardian Markham Jordan and Jenel Lucks to get SNF choice.   DSS guardian Jenel Lucks called CSW back and chose [facility]. Per [facility] admissions coordinator at [facility] he does have an air mattress for patient and will start [facility] SNF authorization today.   [employer], LCSW [phone]

## 2017-07-19 NOTE — NC FL2 (Signed)
[place] MEDICAID FL2 LEVEL OF CARE SCREENING TOOL     IDENTIFICATION  Patient Name: Gwendola Hornaday Birthdate: [birth year] Sex: female Admission Date (Current Location): 07/16/2017  [place] and [place] Number:  Chiropodist and Address:  [facility], [address] [place], [place] [address]      Provider Number: [id number]  Attending Physician Name and Address:  Altamese Dilling, *  Relative Name and Phone Number:       Current Level of Care: Hospital Recommended Level of Care: Skilled Nursing Facility Prior Approval Number:    Date Approved/Denied:   PASRR Number: ([id number] )  Discharge Plan: SNF    Current Diagnoses: Patient Active Problem List   Diagnosis Date Noted  . Altered mental status 07/16/2017  . Dementia 07/16/2017  . UTI (urinary tract infection) 07/16/2017  . Hyperglycemia 07/16/2017  . Sepsis (HCC) 02/25/2016  . Pressure injury of skin 02/25/2016  . Pressure ulcer 11/03/2014  . Protein-calorie malnutrition, severe (HCC) 11/02/2014  . Acute renal failure (HCC) 10/31/2014  . Hyperkalemia 10/31/2014    Orientation RESPIRATION BLADDER Height & Weight     Self, Time  O2(3 Liters Oxygen. ) Incontinent Weight: 121 lb (54.9 kg) Height:   (160 cm)  BEHAVIORAL SYMPTOMS/MOOD NEUROLOGICAL BOWEL NUTRITION STATUS      Continent Diet(Diet: Soft )  AMBULATORY STATUS COMMUNICATION OF NEEDS Skin   Extensive Assist Verbally PU Stage and Appropriate Care(Pressure injuries to right shoulder, left heel and right sacrum)                       Personal Care Assistance Level of Assistance  Bathing, Feeding, Dressing Bathing Assistance: Limited assistance Feeding assistance: Independent Dressing Assistance: Limited assistance Total Care Assistance: Limited assistance   Functional Limitations Info  Sight, Hearing, Speech Sight Info: Adequate Hearing Info: Adequate Speech Info: Adequate    SPECIAL CARE  FACTORS FREQUENCY  PT (By licensed PT), OT (By licensed OT)     PT Frequency: (5) OT Frequency: (5)            Contractures Contractures Info: Not present    Additional Factors Info  Code Status, Allergies Code Status Info: (Full Code. ) Allergies Info: (Sulfa Antibiotics. )           Current Medications (07/19/2017):  This is the current hospital active medication list Current Facility-Administered Medications  Medication Dose Route Frequency Provider Last Rate Last Dose  . acetaminophen (TYLENOL) tablet 650 mg  650 mg Oral Q6H PRN Marguarite Arbour, MD       Or  . acetaminophen (TYLENOL) suppository 650 mg  650 mg Rectal Q6H PRN Marguarite Arbour, MD      . amLODipine (NORVASC) tablet 10 mg  10 mg Oral Daily Marguarite Arbour, MD   10 mg at 07/19/17 0931  . amoxicillin-clavulanate (AUGMENTIN) 875-125 MG per tablet 1 tablet  1 tablet Oral Q12H Altamese Dilling, MD   1 tablet at 07/19/17 0931  . aspirin EC tablet 81 mg  81 mg Oral Daily Marguarite Arbour, MD   81 mg at 07/19/17 0934  . bisacodyl (DULCOLAX) suppository 10 mg  10 mg Rectal Daily PRN Marguarite Arbour, MD      . docusate sodium (COLACE) capsule 100 mg  100 mg Oral BID Marguarite Arbour, MD   100 mg at 07/18/17 2213  . heparin injection 5,000 Units  5,000 Units Subcutaneous Q8H Marguarite Arbour, MD   5,000  Units at 07/19/17 0516  . insulin aspart (novoLOG) injection 0-9 Units  0-9 Units Subcutaneous TID WC Marguarite Arbour, MD   1 Units at 07/18/17 1208  . ipratropium-albuterol (DUONEB) 0.5-2.5 (3) MG/3ML nebulizer solution 3 mL  3 mL Nebulization Q6H PRN Marguarite Arbour, MD      . isosorbide mononitrate (IMDUR) 24 hr tablet 60 mg  60 mg Oral Daily Marguarite Arbour, MD   60 mg at 07/19/17 0935  . metoprolol tartrate (LOPRESSOR) tablet 25 mg  25 mg Oral BID Marguarite Arbour, MD   25 mg at 07/19/17 0931  . OLANZapine (ZYPREXA) tablet 5 mg  5 mg Oral QHS Marguarite Arbour, MD   5 mg at 07/18/17 2213  .  ondansetron (ZOFRAN) tablet 4 mg  4 mg Oral Q6H PRN Marguarite Arbour, MD       Or  . ondansetron (ZOFRAN) injection 4 mg  4 mg Intravenous Q6H PRN Marguarite Arbour, MD      . pantoprazole (PROTONIX) EC tablet 40 mg  40 mg Oral Daily Altamese Dilling, MD   Stopped at 07/18/17 1513  . pravastatin (PRAVACHOL) tablet 20 mg  20 mg Oral Daily Marguarite Arbour, MD   20 mg at 07/19/17 0935     Discharge Medications: Please see discharge summary for a list of discharge medications.  Relevant Imaging Results:  Relevant Lab Results:   Additional Information (SSN: [id number])  Maxxwell Edgett, Darleen Crocker, LCSW

## 2017-07-20 LAB — GLUCOSE, CAPILLARY
GLUCOSE-CAPILLARY: 67 mg/dL (ref 65–99)
GLUCOSE-CAPILLARY: 110 mg/dL — AB (ref 65–99)
Glucose-Capillary: 154 mg/dL — ABNORMAL HIGH (ref 65–99)

## 2017-07-20 NOTE — Progress Notes (Signed)
Per [facility] admissions coordinator at [facility] SNF authorization has been received and patient can D/C when medically stable.   [employer], LCSW [phone]

## 2017-07-20 NOTE — Discharge Instructions (Signed)
Dementia Dementia is the loss of two or more brain functions, such as:  Memory.  Decision making.  Behavior.  Speaking.  Thinking.  Problem solving.  There are many types of dementia. The most common type is called progressive dementia. Progressive dementia gets worse with time and it is irreversible. An example of this type of dementia is Alzheimer disease. What are the causes? This condition may be caused by:  Nerve cell damage in the brain.  Genetic mutations.  Certain medicines.  Multiple small strokes.  An infection, such as chronic meningitis.  A metabolic problem, such as vitamin B12 deficiency or thyroid disease.  Pressure on the brain, such as from a tumor or blood clot.  What are the signs or symptoms? Symptoms of this condition include:  Sudden changes in mood.  Depression.  Problems with balance.  Changes in personality.  Poor short-term memory.  Agitation.  Delusions.  Hallucinations.  Having a hard time: ? Speaking thoughts. ? Finding words. ? Solving problems. ? Doing familiar tasks. ? Understanding familiar ideas.  How is this diagnosed? This condition is diagnosed with an assessment by your health care provider. During this assessment, your health care provider will talk with you and your family, friends, or caregivers about your symptoms. A thorough medical history will be taken, and you will have a physical exam and tests. Tests may include:  Lab tests, such as blood or urine tests.  Imaging tests, such as a CT scan, PET scan, or MRI.  A lumbar puncture. This test involves removing and testing a small amount of the fluid that surrounds the brain and spinal cord.  An electroencephalogram (EEG). In this test, small metal discs are used to measure electrical activity in the brain.  Memory tests, cognitive tests, and neuropsychological tests. These tests evaluate brain function.  How is this treated? Treatment depends on the  cause of the dementia. It may involve taking medicines that may help:  To control the dementia.  To slow down the disease.  To manage symptoms.  In some cases, treating the cause of the dementia can improve symptoms, reverse symptoms, or slow down how quickly the dementia gets worse. Your health care provider can help direct you to support groups, organizations, and other health care providers who can help with decisions about your care. Follow these instructions at home: Medicine  Take over-the-counter and prescription medicines only as told by your health care provider.  Avoid taking medicines that can affect thinking, such as pain or sleeping medicines. Lifestyle   Make healthy lifestyle choices: ? Be physically active as told by your health care provider. ? Do not use any tobacco products, such as cigarettes, chewing tobacco, and e-cigarettes. If you need help quitting, ask your health care provider. ? Eat a healthy diet. ? Practice stress-management techniques when you get stressed. ? Stay social.  Drink enough fluid to keep your urine clear or pale yellow.  Make sure to get quality sleep. These tips can help you to get a good night's rest: ? Avoid napping during the day. ? Keep your sleeping area dark and cool. ? Avoid exercising during the few hours before you go to bed. ? Avoid caffeine products in the evening. General instructions  Work with your health care provider to determine what you need help with and what your safety needs are.  If you were given a bracelet that tracks your location, make sure to wear it.  Keep all follow-up visits as told by your   health care provider. This is important. Contact a health care provider if:  You have any new symptoms.  You have problems with choking or swallowing.  You have any symptoms of a different illness. Get help right away if:  You develop a fever.  You have new or worsening confusion.  You have new or  worsening sleepiness.  You have a hard time staying awake.  You or your family members become concerned for your safety. This information is not intended to replace advice given to you by your health care provider. Make sure you discuss any questions you have with your health care provider. Document Released: 08/18/2000 Document Revised: 07/03/2015 Document Reviewed: 11/20/2014 Elsevier Interactive Patient Education  2018 Elsevier Inc.  

## 2017-07-20 NOTE — Discharge Summary (Signed)
[facility] [place] at [facility]   PATIENT NAME: Jamie Glass    MR#:  [id number]  DATE OF BIRTH:  1928/02/16  DATE OF ADMISSION:  07/16/2017   ADMITTING PHYSICIAN: Marguarite Arbour, MD  DATE OF DISCHARGE: 07/20/2017  PRIMARY CARE PHYSICIAN: Gracelyn Nurse, MD   ADMISSION DIAGNOSIS:  Altered mental status, unspecified altered mental status type [R41.82] DISCHARGE DIAGNOSIS:  Principal Problem:   Altered mental status Active Problems:   Dementia   UTI (urinary tract infection)   Hyperglycemia  SECONDARY DIAGNOSIS:   Past Medical History:  Diagnosis Date  . Anxiety   . Arthritis   . CHF (congestive heart failure) (HCC)   . Chronic kidney disease   . Dementia   . Diabetes mellitus without complication (HCC)   . Hypertension   . Stress headaches 08/21/2014   HOSPITAL COURSE:  84 y.o. female has a past medical history significant for HTN, CHF, DM, and dementia now with worsening confusion and failure to thrive  * Acute metabolic encephalopathy:  due to dehydration and progressive dementia.   After IV fluid patient much improved.   UTI was suspected on admission but her level results are negative.- Stop ceftriaxone.   MRI of the brain is negative.   Appreciate help by neurologist. No further workup needed.   After discussion with legal guardian- they recommend and would like placement to SNF (group home wouldn't take her back) - Please note she has very poor functional status and prognosis  * Decubitus ulcer: no signs of infection - stop augmentin.   Wound care nurse suggested daily dressing change. See their assessment below which I agree.  Pressure injuries to right shoulder, left heel and right sacrum Wound type:Pressure Pressure Injury POA: Yes Measurement: Right sacrum:  2.8cm x 5cm x 1.4cm with 75% red wound bed, 20% white, 5% black., small amount of serous drainage. Right shoulder:  2cm round x -.1cm healing stage 3 Pressure injury.   Dry, red wound bed. Left heel:  1cm round x 0.2cm Stage 3 pressure injury with yellow wound bed, serous drainage.  Wound bed:As described above Drainage (amount, consistency, odor) As described above Periwound: intact.  Perineal area (and buttocks) are somewhat moist and macerated from urinary incontinence. Dressing procedure/placement/frequency:  mattress replacement for microclimate management as well as pressure redistribution done while here in hospital by wound care nurse.  Heel boots will be provided.  Topical wound care to the Stage 3 pressure injury on the right sacrum will be with a calcium alginate as a wound filler, topped with dry gauze. The right shoulder Stage 3 pressure injury is healing and a silicone foam dressing will be the treatment.  The left heel Stage 3 pressure injury will be treated with white petrolatum (Vaseline) gauze covered and secured with a kerlix roll gauze. recommend urinary incontinence management with a powered external urinary incontinence device (PurWick) may be preferred over diapers while in house.   * Hypertension  Continue home medications.  * Hyperlipidemia   Continue pravastatin.  * hypokalemia   Replaced DISCHARGE CONDITIONS:  stable CONSULTS OBTAINED:  Treatment Team:  Lamar Blinks, MD Thana Farr, MD DRUG ALLERGIES:   Allergies  Allergen Reactions  . Sulfa Antibiotics Other (See Comments)    Unknown reaction- listed on MAR.   DISCHARGE MEDICATIONS:   Allergies as of 07/20/2017      Reactions   Sulfa Antibiotics Other (See Comments)   Unknown reaction- listed on MAR.  Medication List    STOP taking these medications   amoxicillin-clavulanate 875-125 MG tablet Commonly known as:  AUGMENTIN     TAKE these medications   amLODipine 10 MG tablet Commonly known as:  NORVASC Take 1 tablet (10 mg total) by mouth daily.   aspirin EC 81 MG tablet Take 81 mg by mouth daily.   gabapentin 100 MG capsule Commonly  known as:  NEURONTIN Take 100 mg by mouth at bedtime.   isosorbide mononitrate 60 MG 24 hr tablet Commonly known as:  IMDUR Take 1 tablet (60 mg total) by mouth daily.   metoprolol tartrate 25 MG tablet Commonly known as:  LOPRESSOR Take 25 mg by mouth 2 (two) times daily.   OLANZapine 5 MG tablet Commonly known as:  ZYPREXA Take 5 mg by mouth at bedtime.   pravastatin 20 MG tablet Commonly known as:  PRAVACHOL Take 20 mg by mouth daily.        DISCHARGE INSTRUCTIONS:   DIET:  Regular diet DISCHARGE CONDITION:  Good ACTIVITY:  Activity as tolerated OXYGEN:  Home Oxygen: No.  Oxygen Delivery: room air DISCHARGE LOCATION:  nursing home - palliative care to follow while at the facility. Consider Hospice transition if she qualifies  If you experience worsening of your admission symptoms, develop shortness of breath, life threatening emergency, suicidal or homicidal thoughts you must seek medical attention immediately by calling 911 or calling your MD immediately  if symptoms less severe.  You Must read complete instructions/literature along with all the possible adverse reactions/side effects for all the Medicines you take and that have been prescribed to you. Take any new Medicines after you have completely understood and accpet all the possible adverse reactions/side effects.   Please note  You were cared for by a hospitalist during your hospital stay. If you have any questions about your discharge medications or the care you received while you were in the hospital after you are discharged, you can call the unit and asked to speak with the hospitalist on call if the hospitalist that took care of you is not available. Once you are discharged, your primary care physician will handle any further medical issues. Please note that NO REFILLS for any discharge medications will be authorized once you are discharged, as it is imperative that you return to your primary care physician  (or establish a relationship with a primary care physician if you do not have one) for your aftercare needs so that they can reassess your need for medications and monitor your lab values.    On the day of Discharge:  VITAL SIGNS:  Blood pressure (!) 160/90, pulse 78, temperature 97.7 F (36.5 C), temperature source Oral, resp. rate 16, height  (1.6 m), weight 57.2 kg (126 lb), SpO2 100 %. PHYSICAL EXAMINATION:  GENERAL:  84 y.o.-year-old patient lying in the bed with no acute distress.  EYES: Pupils equal, round, reactive to light and accommodation. No scleral icterus. Extraocular muscles intact.  HEENT: Head atraumatic, normocephalic. Oropharynx and nasopharynx clear.  NECK:  Supple, no jugular venous distention. No thyroid enlargement, no tenderness.  LUNGS: Normal breath sounds bilaterally, no wheezing, rales,rhonchi or crepitation. No use of accessory muscles of respiration.  CARDIOVASCULAR: S1, S2 normal. No murmurs, rubs, or gallops.  ABDOMEN: Soft, non-tender, non-distended. Bowel sounds present. No organomegaly or mass.  EXTREMITIES: No pedal edema, cyanosis, or clubbing.  NEUROLOGIC: Cranial nerves II through XII are intact. Muscle strength 5/5 in all extremities. Sensation intact. Gait not checked.  PSYCHIATRIC: The patient is confused. SKIN:  Pressure injuries to right shoulder, left heel and right sacrum DATA REVIEW:   CBC Recent Labs  Lab 07/19/17 0739  WBC 5.2  HGB 12.4  HCT 37.9  PLT 324    Chemistries  Recent Labs  Lab 07/17/17 0428 07/19/17 0739  NA 140 136  K 3.3* 4.0  CL 110 106  CO2 24 22  GLUCOSE 90 91  BUN 14 7  CREATININE 0.50 0.56  CALCIUM 8.3* 8.5*  AST 41  --   ALT 33  --   ALKPHOS 64  --   BILITOT 0.5  --      Microbiology Results     Contact information for follow-up providers    Gracelyn Nurse, MD. Schedule an appointment as soon as possible for a visit in 5 day(s).   Specialty:  Internal Medicine Contact  information: [address] [place] [address] [phone]            Contact information for after-discharge care    Destination    [facility] .   Service:  Skilled Nursing Contact information: [address] [facility] [place] [address] [phone]                  Management plans discussed with the patient, family and they are in agreement.  CODE STATUS: Full Code   TOTAL TIME TAKING CARE OF THIS PATIENT: 40 minutes.    Delfino Lovett M.D on 07/20/2017 at 2:32 PM  Between 7am to 6pm - Pager - [phone]  After 6pm go to [email or web address] - Social research officer, government  [facility]  Office  [phone]  CC: Primary care physician; Gracelyn Nurse, MD   Note: This dictation was prepared with Dragon dictation along with smaller phrase technology. Any transcriptional errors that result from this process are unintentional.

## 2017-07-20 NOTE — Progress Notes (Signed)
Patient is medically stable for D/C to [facility] today with palliative following. Per [facility] admissions coordinator at [facility] patient can come today to room E-15. RN will call report and arrange EMS for transport. Clinical Child psychotherapist (CSW) sent D/C orders to [facility] via [employer]. Patient's DSS guardian Saintclair Halsted is aware of above and in agreement with plan. [facility] liaison is aware of above. Please reconsult if future social work needs arise. CSW signing off.   [employer], LCSW [phone]

## 2017-07-20 NOTE — Clinical Social Work Placement (Signed)
   CLINICAL SOCIAL WORK PLACEMENT  NOTE  Date:  07/20/2017  Patient Details  Name: Nataly Pacifico MRN: [id number] Date of Birth: [birth year]  Clinical Social Work is seeking post-discharge placement for this patient at the Skilled  Nursing Facility level of care (*CSW will initial, date and re-position this form in  chart as items are completed):  Yes   Patient/family provided with [facility] Clinical Social Work Department's list of facilities offering this level of care within the geographic area requested by the patient (or if unable, by the patient's family).  Yes   Patient/family informed of their freedom to choose among providers that offer the needed level of care, that participate in Medicare, Medicaid or managed care program needed by the patient, have an available bed and are willing to accept the patient.  Yes   Patient/family informed of [facility]'s ownership interest in [facility] and [facility], as well as of the fact that they are under no obligation to receive care at these facilities.  PASRR submitted to EDS on       PASRR number received on       Existing PASRR number confirmed on 07/19/17     FL2 transmitted to all facilities in geographic area requested by pt/family on 07/19/17     FL2 transmitted to all facilities within larger geographic area on       Patient informed that his/her managed care company has contracts with or will negotiate with certain facilities, including the following:        Yes   Patient/family informed of bed offers received.  Patient chooses bed at [facility] )     Physician recommends and patient chooses bed at      Patient to be transferred to [facility] ) on 07/20/17.  Patient to be transferred to facility by [facility] )     Patient family notified on 07/20/17 of transfer.  Name of family member notified:  (DSS guardian Saintclair Halsted is aware of D/C today. )     PHYSICIAN       Additional Comment:     _______________________________________________ Izmael Duross, Darleen Crocker, LCSW 07/20/2017, 2:53 PM

## 2017-07-20 NOTE — Progress Notes (Signed)
Informed by tech about low blood glucose, instructed to give OJ. Rechecked after 15 mins of admin. See results

## 2017-07-20 NOTE — Progress Notes (Signed)
Report called to facility, Taneesha,RN. EMS called for transport

## 2017-07-20 NOTE — Progress Notes (Signed)
New referral for outpatient Palliative to follow at [facility] received from CSW [facility]. Plan is for discharge today. Patient information faxed to referral. Dayna Barker RN, BSN, [facility], hospital Liaison [phone]

## 2017-07-20 NOTE — Progress Notes (Signed)
[facility] - [place] at [facility]   PATIENT NAME: Jamie Glass    MR#:  [id number]  DATE OF BIRTH:  01-20-1928  SUBJECTIVE:  CHIEF COMPLAINT:   Chief Complaint  Patient presents with  . Altered Mental Status   Brought to the hospital with altered mental status, has baseline dementia. She was found to have no infections and MRI of the brain was negative. Received some IV fluid and appears alert but happily confused today. Today contacted pt's Legal guardian and her son, they said- she is not walking at baseline, and need help with feeding. Have a bad decubetus ulcer and on Abx for that.  REVIEW OF SYSTEMS:  Patient is having dementia and cannot give a review of system.  ROS  DRUG ALLERGIES:   Allergies  Allergen Reactions  . Sulfa Antibiotics Other (See Comments)    Unknown reaction- listed on MAR.    VITALS:  Blood pressure (!) 144/82, pulse 66, temperature 97.6 F (36.4 C), temperature source Axillary, resp. rate (!) 23, height  (1.6 m), weight 57.2 kg (126 lb), SpO2 100 %.  PHYSICAL EXAMINATION:  GENERAL:  84 y.o.-year-old thin patient lying in the bed with no acute distress.  EYES: Pupils equal, round, reactive to light and accommodation. No scleral icterus. Extraocular muscles intact.  HEENT: Head atraumatic, normocephalic. Oropharynx and nasopharynx clear.  NECK:  Supple, no jugular venous distention. No thyroid enlargement, no tenderness.  LUNGS: Normal breath sounds bilaterally, no wheezing, rales,rhonchi or crepitation. No use of accessory muscles of respiration.  CARDIOVASCULAR: S1, S2 normal. No murmurs, rubs, or gallops.  ABDOMEN: Soft, nontender, nondistended. Bowel sounds present. No organomegaly or mass.  EXTREMITIES: No pedal edema, cyanosis, or clubbing.  NEUROLOGIC: Cranial nerves II through XII are intact. Muscle strength 2-3/5 in all extremities. Sensation and Gait not checked. Pt does not follow commands. PSYCHIATRIC: The patient  is alert and oriented x 0.  SKIN: No obvious rash, has a bad decubetus ulcer with discharge on sacrum.  Physical Exam LABORATORY PANEL:   CBC Recent Labs  Lab 07/19/17 0739  WBC 5.2  HGB 12.4  HCT 37.9  PLT 324   ------------------------------------------------------------------------------------------------------------------  Chemistries  Recent Labs  Lab 07/17/17 0428 07/19/17 0739  NA 140 136  K 3.3* 4.0  CL 110 106  CO2 24 22  GLUCOSE 90 91  BUN 14 7  CREATININE 0.50 0.56  CALCIUM 8.3* 8.5*  AST 41  --   ALT 33  --   ALKPHOS 64  --   BILITOT 0.5  --    ------------------------------------------------------------------------------------------------------------------  Cardiac Enzymes No results for input(s): TROPONINI in the last 168 hours. ------------------------------------------------------------------------------------------------------------------  RADIOLOGY:  Dg Sacrum/coccyx  Result Date: 07/18/2017 CLINICAL DATA:  Decubitus ulcer. EXAM: SACRUM AND COCCYX - 2+ VIEW COMPARISON:  Pelvic radiograph 07/16/2017 FINDINGS: Deficiency of the soft tissues superficial to the coccyx and lower sacrum is compatible with the history of decubitus ulcer. No convincing underlying osseous erosion is identified, however osteopenia, mild overpenetration of this region on the lateral radiograph, and obscuration of the sacrum and coccyx by stool on the AP radiographs limit assessment. No acute fracture or pelvic diastasis is identified. Atherosclerotic vascular calcifications and mild bilateral hip joint space narrowing are noted. IMPRESSION: No convincing evidence of active osteomyelitis within limitations as above. Electronically Signed   By: Sebastian Ache M.D.   On: 07/18/2017 16:40    ASSESSMENT AND PLAN:   Principal Problem:   Altered mental status Active Problems:  Dementia   UTI (urinary tract infection)   Hyperglycemia  * Altered mental status   Likely dehydration  and progressive dementia.   After IV fluid patient much improved.   UTI was suspected on admission but her level results are negative.- Stop ceftriaxone.   MRI of the brain is negative.   Appreciate help by neurologist. No further workup needed.   After discussion with legal guardian- she may be abel to go back to her facility with home health.   Have very poor functional status and prognosis, I recommend DNR and palliative involvement.  * Decubetus ulcer with infection   On oral augmentin.   Wound care nurse suggested daily dressing change.  * Hypertension  Continue home medications.  * Hyperlipidemia   Continue pravastatin.  * hypokalemia   Replace oral.  All the records are reviewed and case discussed with Care Management/Social Workerr. Management plans discussed with the patient, family and they are in agreement.  CODE STATUS: Full.  TOTAL TIME TAKING CARE OF THIS PATIENT: 35 minutes.    POSSIBLE D/C IN 1-2 DAYS, DEPENDING ON CLINICAL CONDITION.   Altamese Dilling M.D on 07/20/2017   Between 7am to 6pm - Pager - [phone]  After 6pm go to [email or web address] - password [email or web address]  [facility]  Office  [phone]  CC: Primary care physician; Gracelyn Nurse, MD  Note: This dictation was prepared with Dragon dictation along with smaller phrase technology. Any transcriptional errors that result from this process are unintentional.

## 2017-07-20 NOTE — Progress Notes (Signed)
Clinical Social Worker (CSW) faxed MD note recommending DNR status to DSS guardian.   [employer], LCSW [phone]

## 2017-07-21 NOTE — Progress Notes (Signed)
EMMALINA, ESPERICUETA ([id number]) Visit Report for 07/15/2017 Chief Complaint Document Details Patient Name: Jamie Glass, Jamie Glass Date of Service: 07/15/2017 1:15 PM Medical Record Number: [id number] Patient Account Number: [id number] Date of Birth/Sex: 1927/12/07 (84 y.o. F) Treating RN: Curtis Sites Primary Care Provider: Marcelino Duster Other Clinician: Referring Provider: Marcelino Duster Treating Provider/Extender: Linwood Dibbles,  Weeks in Treatment: 1 Information Obtained from: Patient Chief Complaint Right Illium pressure ulcer Electronic Signature(s) Signed: 07/16/2017 10:27:37 AM By: Lenda Kelp PA-C Entered By: Lenda Kelp on 07/15/2017 13:28:03 PALECEK, Antinette ([id number]) -------------------------------------------------------------------------------- Debridement Details Patient Name: Jamie Glass Date of Service: 07/15/2017 1:15 PM Medical Record Number: [id number] Patient Account Number: [id number] Date of Birth/Sex: 11-Oct-1927 (84 y.o. F) Treating RN: Curtis Sites Primary Care Provider: Marcelino Duster Other Clinician: Referring Provider: Marcelino Duster Treating Provider/Extender: Linwood Dibbles,  Weeks in Treatment: 1 Debridement Performed for Wound #1 Right Ilium Assessment: Performed By: Physician STONE III,  E., PA-C Debridement Type: Debridement Pre-procedure Verification/Time Yes - 14:17 Out Taken: Start Time: 14:17 Pain Control: Lidocaine 4% Topical Solution Total Area Debrided (L x W): 4.5 (cm) x 4.1 (cm) = 18.45 (cm) Tissue and other material Viable, Non-Viable, Muscle, Slough, [place] , Slough debrided: Level: Skin/Subcutaneous Tissue/Muscle Debridement Description: Excisional Instrument: Forceps, Scissors Bleeding: Minimum Hemostasis Achieved: Pressure End Time: 14:23 Procedural Pain: 0 Post Procedural Pain: 0 Response to Treatment: Procedure was tolerated well Level of Consciousness: Responds to Verbal Stimuli Post Procedure  Vitals: Temperature: 98.0 Pulse: 93 Respiratory Rate: 14 Blood Pressure: Systolic Blood Pressure: 176 Diastolic Blood Pressure: 86 Post Debridement Measurements of Total Wound Length: (cm) 4.5 Stage: Category/Stage IV Width: (cm) 4.1 Depth: (cm) 1.9 Volume: (cm) 27.532 Character of Wound/Ulcer Post Improved Debridement: Post Procedure Diagnosis Same as Pre-procedure Electronic Signature(s) Signed: 07/15/2017 4:42:41 PM By: Curtis Sites Signed: 07/16/2017 10:27:37 AM By: Lenda Kelp PA-C Entered By: Curtis Sites on 07/15/2017 14:30:52 Jamie Glass, Jamie Glass ([id number]) BLASKO, Vibha ([id number]) -------------------------------------------------------------------------------- HPI Details Patient Name: Jamie Glass Date of Service: 07/15/2017 1:15 PM Medical Record Number: [id number] Patient Account Number: [id number] Date of Birth/Sex: [birth year] (84 y.o. F) Treating RN: Curtis Sites Primary Care Provider: Marcelino Duster Other Clinician: Referring Provider: Marcelino Duster Treating Provider/Extender: Linwood Dibbles,  Weeks in Treatment: 1 History of Present Illness Associated Signs and Symptoms: Patient has a history of diabetes mellitus type II, hypertension, and diabetic peripheral neuropathy. HPI Description: 07/08/17 other valuation today patient appears to be doing okay overall. She does have an issue with her left ilium region where she has a pressure ulcer. This shows a significant amount of the chronic tissue in the wound bed that looks as if it is going to require sharp debridement today. She does have some discomfort with cleansing and palpation over the region. Fortunately there does not appear to be any evidence of severe infection which is good news. Patient does have dementia and therefore is not able to give much of the weight of history. 07/15/17 on evaluation today patient appears to be doing a little bit worse in regard to her ulceration. Unfortunately  there appears to be a little bit more necrotic tissue noted in the periphery of the wound that will require sharp debridement today. The good news is she does not seem to be having too much pain in fact she slept in the majority of the visit today. Nonetheless I still do not know the answer as to whether or not she has an air mattress I think she needs one as soon as possible. Electronic Signature(s) Signed: 07/16/2017  10:27:37 AM By: Lenda Kelp PA-C Entered By: Lenda Kelp on 07/16/2017 09:49:14 Jamie Glass, Jamie Glass ([id number]) -------------------------------------------------------------------------------- Physical Exam Details Patient Name: Jamie Glass Date of Service: 07/15/2017 1:15 PM Medical Record Number: [id number] Patient Account Number: [id number] Date of Birth/Sex: 07-Mar-1928 (84 y.o. F) Treating RN: Curtis Sites Primary Care Provider: Marcelino Duster Other Clinician: Referring Provider: Marcelino Duster Treating Provider/Extender: Linwood Dibbles,  Weeks in Treatment: 1 Constitutional Well-nourished and well-hydrated in no acute distress. Respiratory normal breathing without difficulty. Psychiatric this patient is able to make decisions and demonstrates good insight into disease process. Alert and Oriented x 3. pleasant and cooperative. Notes Currently patient's wound showed evidence of necrotic tissue in the base of the wound that is going to require sharp debridement today. Subsequently also did note that there was an area of breaking down different from last week's evaluation extending more medially that she did not appear to have any feeling in that also needed to be debrided away as this is no longer viable. Unfortunately this does make the wound larger than previously noted the good news is she did not have any significant pain she tolerated this today excellent without any complication. Electronic Signature(s) Signed: 07/16/2017 10:27:37 AM By: Lenda Kelp  PA-C Entered By: Lenda Kelp on 07/16/2017 09:50:18 Jamie Glass, Jamie Glass ([id number]) -------------------------------------------------------------------------------- Physician Orders Details Patient Name: Jamie Glass Date of Service: 07/15/2017 1:15 PM Medical Record Number: [id number] Patient Account Number: [id number] Date of Birth/Sex: 13-Jan-1928 (84 y.o. F) Treating RN: Curtis Sites Primary Care Provider: Marcelino Duster Other Clinician: Referring Provider: Marcelino Duster Treating Provider/Extender: Linwood Dibbles,  Weeks in Treatment: 1 Verbal / Phone Orders: No Diagnosis Coding ICD-10 Coding Code Description L89.890 Pressure ulcer of other site, unstageable E11.622 Type 2 diabetes mellitus with other skin ulcer I10 Essential (primary) hypertension E11.40 Type 2 diabetes mellitus with diabetic neuropathy, unspecified Wound Cleansing Wound #1 Right Ilium o Clean wound with Normal Saline. o Cleanse wound with mild soap and water Anesthetic (add to Medication List) Wound #1 Right Ilium o Topical Lidocaine 4% cream applied to wound bed prior to debridement (In Clinic Only). Primary Wound Dressing Wound #1 Right Ilium o Other: - Dakin's soaked gauze Secondary Dressing Wound #1 Right Ilium o ABD pad o Boardered Foam Dressing - please cover right trochanter with bordered foam dressing for protection o XtraSorb - secure with tape Dressing Change Frequency Wound #1 Right Ilium o Change dressing every day. Follow-up Appointments Wound #1 Right Ilium o Return Appointment in 1 week. Off-Loading Wound #1 Right Ilium o Roho cushion for wheelchair - [facility] please make sure patient has an appropriate w/c cushion o Turn and reposition every 2 hours - HHRN please make sure patient has appropriate mattress cover for pressure relief Jamie Glass, Jamie Glass ([id number]) Additional Orders / Instructions Wound #1 Right Ilium o Vitamin A; Vitamin C, Zinc o Increase  protein intake. Home Health Wound #1 Right Ilium o Continue Home Health Visits o Home Health Nurse may visit PRN to address patientos wound care needs. o FACE TO FACE ENCOUNTER: MEDICARE and MEDICAID PATIENTS: I certify that this patient is under my care and that I had a face-to-face encounter that meets the physician face-to-face encounter requirements with this patient on this date. The encounter with the patient was in whole or in part for the following MEDICAL CONDITION: (primary reason for Home Healthcare) MEDICAL NECESSITY: I certify, that based on my findings, NURSING services are a medically necessary home health service. HOME BOUND STATUS: I certify that my  clinical findings support that this patient is homebound (i.e., Due to illness or injury, pt requires aid of supportive devices such as crutches, cane, wheelchairs, walkers, the use of special transportation or the assistance of another person to leave their place of residence. There is a normal inability to leave the home and doing so requires considerable and taxing effort. Other absences are for medical reasons / religious services and are infrequent or of short duration when for other reasons). o If current dressing causes regression in wound condition, may D/C ordered dressing product/s and apply Normal Saline Moist Dressing daily until next Wound Healing Center / Other MD appointment. Notify Wound Healing Center of regression in wound condition at [phone]. o Please direct any NON-WOUND related issues/requests for orders to patient's Primary Care Physician Radiology o X-ray, other - sacrum Electronic Signature(s) Signed: 07/15/2017 4:42:41 PM By: Curtis Sites Signed: 07/16/2017 10:27:37 AM By: Lenda Kelp PA-C Entered By: Curtis Sites on 07/15/2017 14:36:11 Jamie Glass, Jamie Glass ([id number]) -------------------------------------------------------------------------------- Problem List Details Patient Name:  Jamie Glass Date of Service: 07/15/2017 1:15 PM Medical Record Number: [id number] Patient Account Number: [id number] Date of Birth/Sex: [birth year] (84 y.o. F) Treating RN: Curtis Sites Primary Care Provider: Marcelino Duster Other Clinician: Referring Provider: Marcelino Duster Treating Provider/Extender: Linwood Dibbles,  Weeks in Treatment: 1 Active Problems ICD-10 Impacting Encounter Code Description Active Date Wound Healing Diagnosis L89.894 Pressure ulcer of other site, stage 4 07/09/2017 Yes E11.622 Type 2 diabetes mellitus with other skin ulcer 07/09/2017 Yes I10 Essential (primary) hypertension 07/09/2017 Yes E11.40 Type 2 diabetes mellitus with diabetic neuropathy, 07/09/2017 Yes unspecified Inactive Problems Resolved Problems Electronic Signature(s) Signed: 07/16/2017 10:27:37 AM By: Lenda Kelp PA-C Entered By: Lenda Kelp on 07/16/2017 09:51:54 Jamie Glass, Jamie Glass ([id number]) -------------------------------------------------------------------------------- Progress Note Details Patient Name: Jamie Glass Date of Service: 07/15/2017 1:15 PM Medical Record Number: [id number] Patient Account Number: [id number] Date of Birth/Sex: Mar 21, 1927 (84 y.o. F) Treating RN: Curtis Sites Primary Care Provider: Marcelino Duster Other Clinician: Referring Provider: Marcelino Duster Treating Provider/Extender: Linwood Dibbles,  Weeks in Treatment: 1 Subjective Chief Complaint Information obtained from Patient Right Illium pressure ulcer History of Present Illness (HPI) The following HPI elements were documented for the patient's wound: Associated Signs and Symptoms: Patient has a history of diabetes mellitus type II, hypertension, and diabetic peripheral neuropathy. 07/08/17 other valuation today patient appears to be doing okay overall. She does have an issue with her left ilium region where she has a pressure ulcer. This shows a significant amount of the chronic tissue in the wound bed that  looks as if it is going to require sharp debridement today. She does have some discomfort with cleansing and palpation over the region. Fortunately there does not appear to be any evidence of severe infection which is good news. Patient does have dementia and therefore is not able to give much of the weight of history. 07/15/17 on evaluation today patient appears to be doing a little bit worse in regard to her ulceration. Unfortunately there appears to be a little bit more necrotic tissue noted in the periphery of the wound that will require sharp debridement today. The good news is she does not seem to be having too much pain in fact she slept in the majority of the visit today. Nonetheless I still do not know the answer as to whether or not she has an air mattress I think she needs one as soon as possible. Patient History Unable to Obtain Patient History due to Aphasia. Information obtained  from Patient. Medical And Surgical History Notes Genitourinary CKD Review of Systems (ROS) Constitutional Symptoms (General Health) Denies complaints or symptoms of Fever, Chills. Respiratory The patient has no complaints or symptoms. Cardiovascular The patient has no complaints or symptoms. Psychiatric The patient has no complaints or symptoms. Jamie Glass, Jamie Glass ([id number]) Objective Constitutional Well-nourished and well-hydrated in no acute distress. Vitals Time Taken: 1:35 PM, Temperature: 98.0 F, Pulse: 93 bpm, Respiratory Rate: 16 breaths/min, Blood Pressure: 176/86 mmHg. Respiratory normal breathing without difficulty. Psychiatric this patient is able to make decisions and demonstrates good insight into disease process. Alert and Oriented x 3. pleasant and cooperative. General Notes: Currently patient's wound showed evidence of necrotic tissue in the base of the wound that is going to require sharp debridement today. Subsequently also did note that there was an area of breaking down  different from last week's evaluation extending more medially that she did not appear to have any feeling in that also needed to be debrided away as this is no longer viable. Unfortunately this does make the wound larger than previously noted the good news is she did not have any significant pain she tolerated this today excellent without any complication. Integumentary (Hair, Skin) Wound #1 status is Open. Original cause of wound was Pressure Injury. The wound is located on the Right Ilium. The wound measures 4.5cm length x 4.1cm width x 1.8cm depth; 14.491cm^2 area and 26.083cm^3 volume. There is Fat Layer (Subcutaneous Tissue) Exposed exposed. There is tunneling at 3:00 with a maximum distance of 1.6cm. There is a medium amount of serosanguineous drainage noted. Foul odor after cleansing was noted. The wound margin is well defined and not attached to the wound base. There is no granulation within the wound bed. There is a large (67-100%) amount of necrotic tissue within the wound bed including Eschar and Adherent Slough. The periwound skin appearance exhibited: Hemosiderin Staining. The periwound skin appearance did not exhibit: Callus, Crepitus, Excoriation, Induration, Rash, Scarring, Dry/Scaly, Maceration, Atrophie Blanche, Cyanosis, Ecchymosis, Mottled, Pallor, Rubor, Erythema. Assessment Active Problems ICD-10 L89.894 - Pressure ulcer of other site, stage 4 E11.622 - Type 2 diabetes mellitus with other skin ulcer I10 - Essential (primary) hypertension E11.40 - Type 2 diabetes mellitus with diabetic neuropathy, unspecified Procedures Wound #1 Pre-procedure diagnosis of Wound #1 is a Pressure Ulcer located on the Right Ilium . There was a Excisional Jamie Glass, Jamie Glass ([id number]) Skin/Subcutaneous Tissue/Muscle Debridement with a total area of 18.45 sq cm performed by STONE III,  E., PA-C. With the following instrument(s): Forceps, and Scissors to remove Viable and Non-Viable  tissue/material. Material removed includes Muscle, Slough, and Fascia after achieving pain control using Lidocaine 4% Topical Solution. No specimens were taken. A time out was conducted at 14:17, prior to the start of the procedure. A Minimum amount of bleeding was controlled with Pressure. The procedure was tolerated well with a pain level of 0 throughout and a pain level of 0 following the procedure. Patient s Level of Consciousness post procedure was recorded as Responds to Verbal Stimuli and post-procedure vitals were taken including Temperature: 98.0 F, Pulse: 93 bpm, Respiratory Rate: 14 breaths/min, Blood Pressure: (176)/(86) mmHg. Post Debridement Measurements: 4.5cm length x 4.1cm width x 1.9cm depth; 27.532cm^3 volume. Post debridement Stage noted as Category/Stage IV. Character of Wound/Ulcer Post Debridement is improved. Post procedure Diagnosis Wound #1: Same as Pre-Procedure Plan Wound Cleansing: Wound #1 Right Ilium: Clean wound with Normal Saline. Cleanse wound with mild soap and water Anesthetic (add to Medication List): Wound #1  Right Ilium: Topical Lidocaine 4% cream applied to wound bed prior to debridement (In Clinic Only). Primary Wound Dressing: Wound #1 Right Ilium: Other: - Dakin's soaked gauze Secondary Dressing: Wound #1 Right Ilium: ABD pad Boardered Foam Dressing - please cover right trochanter with bordered foam dressing for protection XtraSorb - secure with tape Dressing Change Frequency: Wound #1 Right Ilium: Change dressing every day. Follow-up Appointments: Wound #1 Right Ilium: Return Appointment in 1 week. Off-Loading: Wound #1 Right Ilium: Roho cushion for wheelchair - HHRN please make sure patient has an appropriate w/c cushion Turn and reposition every 2 hours - HHRN please make sure patient has appropriate mattress cover for pressure relief Additional Orders / Instructions: Wound #1 Right Ilium: Vitamin A; Vitamin C, Zinc Increase protein  intake. Home Health: Wound #1 Right Ilium: Continue Home Health Visits Home Health Nurse may visit PRN to address patient s wound care needs. FACE TO FACE ENCOUNTER: MEDICARE and MEDICAID PATIENTS: I certify that this patient is under my care and that I had a face-to-face encounter that meets the physician face-to-face encounter requirements with this patient on this date. The encounter with the patient was in whole or in part for the following MEDICAL CONDITION: (primary reason for Home Healthcare) MEDICAL NECESSITY: I certify, that based on my findings, NURSING services are a medically necessary home health service. HOME BOUND STATUS: I certify that my clinical findings support that this patient is homebound (i.e., Due to illness or injury, pt requires aid of supportive devices such as crutches, cane, wheelchairs, walkers, the use of special transportation or the assistance of another person to leave their place of residence. There is a normal inability to leave the [place], [place] ([id number]) home and doing so requires considerable and taxing effort. Other absences are for medical reasons / religious services and are infrequent or of short duration when for other reasons). If current dressing causes regression in wound condition, may D/C ordered dressing product/s and apply Normal Saline Moist Dressing daily until next Wound Healing Center / Other MD appointment. Notify Wound Healing Center of regression in wound condition at [phone]. Please direct any NON-WOUND related issues/requests for orders to patient's Primary Care Physician Radiology ordered were: X-ray, other - sacrum I'm gonna recommend at this point that we go ahead and initiate treatment with the [facility] yet again I still believe this is gonna be the best thing for her currently. I do think she's a Roho cushion for a wheelchair I think shelter needs to be repositioned and also believe that she needs an air mattress. We will  see about having home health evaluate all these things to ensure they are in place. I am somewhat concerned about the fact that this seems to be getting worse and bigger I hope this trend is not continue. Please see above for specific wound care orders. We will see patient for re-evaluation in 1 week(s) here in the clinic. If anything worsens or changes patient will contact our office for additional recommendations. Electronic Signature(s) Signed: 07/16/2017 10:27:37 AM By: Lenda Kelp PA-C Entered By: Lenda Kelp on 07/16/2017 09:52:07 Jamie Glass, Jamie Glass ([id number]) -------------------------------------------------------------------------------- ROS/PFSH Details Patient Name: Jamie Glass Date of Service: 07/15/2017 1:15 PM Medical Record Number: [id number] Patient Account Number: [id number] Date of Birth/Sex: [birth year] (84 y.o. F) Treating RN: Curtis Sites Primary Care Provider: Marcelino Duster Other Clinician: Referring Provider: Marcelino Duster Treating Provider/Extender: Linwood Dibbles,  Weeks in Treatment: 1 Unable to Obtain Patient History due to oo Aphasia Information Obtained From  Patient Wound History Do you currently have one or more open woundso Yes How many open wounds do you currently haveo 1 Approximately how long have you had your woundso 1 month How have you been treating your wound(s) until nowo HHRN Constitutional Symptoms (General Health) Complaints and Symptoms: Negative for: Fever; Chills Respiratory Complaints and Symptoms: No Complaints or Symptoms Cardiovascular Complaints and Symptoms: No Complaints or Symptoms Medical History: Positive for: Congestive Heart Failure; Hypertension Endocrine Medical History: Positive for: Type II Diabetes Genitourinary Medical History: Past Medical History Notes: CKD Musculoskeletal Medical History: Positive for: Osteoarthritis Neurologic Medical History: Positive for: Dementia Jamie Glass, Jamie Glass  ([id number]) Psychiatric Complaints and Symptoms: No Complaints or Symptoms Immunizations Pneumococcal Vaccine: Received Pneumococcal Vaccination: Yes Implantable Devices Physician Affirmation I have reviewed and agree with the above information. Electronic Signature(s) Signed: 07/16/2017 10:27:37 AM By: Lenda Kelp PA-C Signed: 07/20/2017 4:31:04 PM By: Curtis Sites Entered By: Lenda Kelp on 07/16/2017 09:49:45 Jamie Glass, Jamie Glass ([id number]) -------------------------------------------------------------------------------- SuperBill Details Patient Name: Jamie Glass Date of Service: 07/15/2017 Medical Record Number: [id number] Patient Account Number: [id number] Date of Birth/Sex: 1927-04-21 (84 y.o. F) Treating RN: Curtis Sites Primary Care Provider: Marcelino Duster Other Clinician: Referring Provider: Marcelino Duster Treating Provider/Extender: Linwood Dibbles,  Weeks in Treatment: 1 Diagnosis Coding ICD-10 Codes Code Description [phone] Pressure ulcer of other site, stage 4 E11.622 Type 2 diabetes mellitus with other skin ulcer I10 Essential (primary) hypertension E11.40 Type 2 diabetes mellitus with diabetic neuropathy, unspecified Facility Procedures CPT4 Code: [id number] Description: 11043 - DEB MUSC/FASCIA 20 SQ CM/< ICD-10 Diagnosis Description L89.894 Pressure ulcer of other site, stage 4 Modifier: Quantity: 1 Physician Procedures CPT4 Code: [id number] Description: 11043 - WC PHYS DEBR MUSCLE/FASCIA 20 SQ CM ICD-10 Diagnosis Description L89.894 Pressure ulcer of other site, stage 4 Modifier: Quantity: 1 Electronic Signature(s) Signed: 07/16/2017 10:27:37 AM By: Lenda Kelp PA-C Entered By: Lenda Kelp on 07/16/2017 09:52:23

## 2017-07-25 ENCOUNTER — Ambulatory Visit: Payer: Medicare Other | Admitting: Physician Assistant

## 2017-08-03 NOTE — Progress Notes (Addendum)
EH, SAUSEDA ([id number]) Visit Report for 07/15/2017 Arrival Information Details Patient Name: Jamie Glass, Jamie Glass Date of Service: 07/15/2017 1:15 PM Medical Record Number: [id number] Patient Account Number: [id number] Date of Birth/Sex: Aug 29, 1927 (84 y.o. F) Treating RN: Curtis Sites Primary Care Jie Stickels: Marcelino Duster Other Clinician: Referring Shresta Risden: Marcelino Duster Treating Rustin Erhart/Extender: Linwood Dibbles, HOYT Weeks in Treatment: 1 Visit Information History Since Last Visit Added or deleted any medications: No Patient Arrived: Wheel Chair Any new allergies or adverse reactions: No Arrival Time: 13:29 Had a fall or experienced change in No Accompanied By: cousin activities of daily living that may affect Transfer Assistance: EasyPivot Patient risk of falls: Lift Signs or symptoms of abuse/neglect since No Patient Identification Verified: Yes last visito Secondary Verification Process Yes Hospitalized since last visit: No Completed: Implantable device outside of the clinic No Patient Has Alerts: Yes excluding Patient Alerts: Diabetic cellular tissue based products placed in the center since last visit: Pain Present Now: Unable to Respond Electronic Signature(s) Signed: 08/03/2017 7:45:54 AM By: Arnette Norris Entered By: Arnette Norris on 07/15/2017 13:37:46 Jamie Glass, Jamie Glass ([id number]) -------------------------------------------------------------------------------- Encounter Discharge Information Details Patient Name: Jamie Glass Date of Service: 07/15/2017 1:15 PM Medical Record Number: [id number] Patient Account Number: [id number] Date of Birth/Sex: May 31, 1927 (84 y.o. F) Treating RN: Curtis Sites Primary Care Darlisa Spruiell: Marcelino Duster Other Clinician: Referring Clinton Dragone: Marcelino Duster Treating Ebubechukwu Jedlicka/Extender: Linwood Dibbles, HOYT Weeks in Treatment: 1 Encounter Discharge Information Items Discharge Condition: Stable Ambulatory Status: Wheelchair Discharge  Destination: Home Transportation: Private Auto Accompanied By: cousin Schedule Follow-up Appointment: Yes Clinical Summary of Care: Electronic Signature(s) Signed: 08/03/2017 7:45:54 AM By: Arnette Norris Entered By: Arnette Norris on 07/15/2017 14:44:40 Jamie Glass, Jamie Glass ([id number]) -------------------------------------------------------------------------------- Lower Extremity Assessment Details Patient Name: Jamie Glass Date of Service: 07/15/2017 1:15 PM Medical Record Number: [id number] Patient Account Number: [id number] Date of Birth/Sex: 14-Mar-1927 (84 y.o. F) Treating RN: Curtis Sites Primary Care Kaiyana Bedore: Marcelino Duster Other Clinician: Referring Paisleigh Maroney: Marcelino Duster Treating Oneal Biglow/Extender: Linwood Dibbles, HOYT Weeks in Treatment: 1 Electronic Signature(s) Signed: 07/15/2017 4:42:41 PM By: Curtis Sites Signed: 08/03/2017 7:45:54 AM By: Arnette Norris Entered By: Arnette Norris on 07/15/2017 13:39:15 Lagrow, Evia ([id number]) -------------------------------------------------------------------------------- Multi Wound Chart Details Patient Name: Jamie Glass Date of Service: 07/15/2017 1:15 PM Medical Record Number: [id number] Patient Account Number: [id number] Date of Birth/Sex: 08/24/1927 (84 y.o. F) Treating RN: Curtis Sites Primary Care Shamra Bradeen: Marcelino Duster Other Clinician: Referring Linell Meldrum: Marcelino Duster Treating Modell Fendrick/Extender: Linwood Dibbles, HOYT Weeks in Treatment: 1 Vital Signs Height(in): Pulse(bpm): 93 Weight(lbs): Blood Pressure(mmHg): 176/86 Body Mass Index(BMI): Temperature(F): 98.0 Respiratory Rate 16 (breaths/min): Photos: [1:No Photos] [N/A:N/A] Wound Location: [1:Right Ilium] [N/A:N/A] Wounding Event: [1:Pressure Injury] [N/A:N/A] Primary Etiology: [1:Pressure Ulcer] [N/A:N/A] Comorbid History: [1:Congestive Heart Failure, Hypertension, Type II Diabetes, Osteoarthritis, Dementia] [N/A:N/A] Date Acquired: [1:04/08/2017]  [N/A:N/A] Weeks of Treatment: [1:1] [N/A:N/A] Wound Status: [1:Open] [N/A:N/A] Measurements L x W x D [1:4.5x4.1x1.8] [N/A:N/A] (cm) Area (cm) : [1:14.491] [N/A:N/A] Volume (cm) : [1:26.083] [N/A:N/A] % Reduction in Area: [1:-269.00%] [N/A:N/A] % Reduction in Volume: [1:-730.10%] [N/A:N/A] Position 1 (o'clock): [1:3] Maximum Distance 1 (cm): [1:1.6] Tunneling: [1:Yes] [N/A:N/A] Classification: [1:Unstageable/Unclassified] [N/A:N/A] Exudate Amount: [1:Medium] [N/A:N/A] Exudate Type: [1:Serosanguineous] [N/A:N/A] Exudate Color: [1:red, brown] [N/A:N/A] Foul Odor After Cleansing: [1:Yes] [N/A:N/A] Odor Anticipated Due to [1:No] [N/A:N/A] Product Use: Wound Margin: [1:Well defined, not attached] [N/A:N/A] Granulation Amount: [1:None Present (0%)] [N/A:N/A] Necrotic Amount: [1:Large (67-100%)] [N/A:N/A] Necrotic Tissue: [1:Eschar, Adherent Slough] [N/A:N/A] Exposed Structures: [1:Fat Layer (Subcutaneous Tissue) Exposed: Yes] [N/A:N/A] Epithelialization: [1:None] [N/A:N/A] Periwound Skin Texture: [1:Excoriation: No Induration: No Callus:  No] [N/A:N/A] Crepitus: No Rash: No Scarring: No Periwound Skin Moisture: Maceration: No N/A N/A Dry/Scaly: No Periwound Skin Color: Hemosiderin Staining: Yes N/A N/A Atrophie Blanche: No Cyanosis: No Ecchymosis: No Erythema: No Mottled: No Pallor: No Rubor: No Tenderness on Palpation: No N/A N/A Wound Preparation: Ulcer Cleansing: N/A N/A Rinsed/Irrigated with Saline Topical Anesthetic Applied: Other: lidocaine 4% Treatment Notes Electronic Signature(s) Signed: 07/15/2017 4:42:41 PM By: Curtis Sites Entered By: Curtis Sites on 07/15/2017 14:14:24 Jamie Glass, Jamie Glass ([id number]) -------------------------------------------------------------------------------- Multi-Disciplinary Care Plan Details Patient Name: Jamie Glass Date of Service: 07/15/2017 1:15 PM Medical Record Number: [id number] Patient Account Number:  [id number] Date of Birth/Sex: 1927-12-01 (84 y.o. F) Treating RN: Curtis Sites Primary Care Marylen Zuk: Marcelino Duster Other Clinician: Referring Huxley Shurley: Marcelino Duster Treating Elna Radovich/Extender: Linwood Dibbles, HOYT Weeks in Treatment: 1 Active Inactive Electronic Signature(s) Signed: 08/09/2017 10:57:46 AM By: Elliot Gurney, BSN, RN, CWS, Kim RN, BSN Signed: 08/11/2017 4:04:19 PM By: Curtis Sites Previous Signature: 07/15/2017 4:42:41 PM Version By: Curtis Sites Entered By: Elliot Gurney BSN, RN, CWS, Kim on 08/09/2017 10:57:45 Jamie Glass, Jamie Glass ([id number]) -------------------------------------------------------------------------------- Pain Assessment Details Patient Name: Jamie Glass Date of Service: 07/15/2017 1:15 PM Medical Record Number: [id number] Patient Account Number: [id number] Date of Birth/Sex: August 07, 1927 (84 y.o. F) Treating RN: Curtis Sites Primary Care Jenasia Dolinar: Marcelino Duster Other Clinician: Referring Marleen Moret: Marcelino Duster Treating Yariana Hoaglund/Extender: Linwood Dibbles, HOYT Weeks in Treatment: 1 Active Problems Location of Pain Severity and Description of Pain Patient Has Paino Patient Unable to Respond Site Locations Pain Management and Medication Current Pain Management: Electronic Signature(s) Signed: 07/15/2017 4:42:41 PM By: Curtis Sites Signed: 08/03/2017 7:45:54 AM By: Arnette Norris Entered By: Arnette Norris on 07/15/2017 13:37:58 Jamie Glass, Jamie Glass ([id number]) -------------------------------------------------------------------------------- Patient/Caregiver Education Details Patient Name: Jamie Glass Date of Service: 07/15/2017 1:15 PM Medical Record Number: [id number] Patient Account Number: [id number] Date of Birth/Gender: 07-14-1927 (84 y.o. F) Treating RN: Curtis Sites Primary Care Physician: Marcelino Duster Other Clinician: Referring Physician: Marcelino Duster Treating Physician/Extender: Skeet Simmer in Treatment: 1 Education Assessment Education  Provided To: Caregiver Education Topics Provided Wound/Skin Impairment: Handouts: Caring for Your Ulcer Methods: Explain/Verbal Responses: State content correctly Electronic Signature(s) Signed: 08/03/2017 7:45:54 AM By: Arnette Norris Entered By: Arnette Norris on 07/15/2017 14:44:57 Jamie Glass, Jamie Glass ([id number]) -------------------------------------------------------------------------------- Wound Assessment Details Patient Name: Jamie Glass Date of Service: 07/15/2017 1:15 PM Medical Record Number: [id number] Patient Account Number: [id number] Date of Birth/Sex: 06-23-1927 (84 y.o. F) Treating RN: Curtis Sites Primary Care Laci Frenkel: Marcelino Duster Other Clinician: Referring Karolyne Timmons: Marcelino Duster Treating Rylei Masella/Extender: Linwood Dibbles, HOYT Weeks in Treatment: 1 Wound Status Wound Number: 1 Primary Pressure Ulcer Etiology: Wound Location: Right Ilium Wound Open Wounding Event: Pressure Injury Status: Date Acquired: 04/08/2017 Comorbid Congestive Heart Failure, Hypertension, Type Weeks Of Treatment: 1 History: II Diabetes, Osteoarthritis, Dementia Clustered Wound: No Photos Photo Uploaded By: Arnette Norris on 07/15/2017 16:09:59 Wound Measurements Length: (cm) 4.5 Width: (cm) 4.1 Depth: (cm) 1.8 Area: (cm) 14.491 Volume: (cm) 26.083 % Reduction in Area: -269% % Reduction in Volume: -730.1% Epithelialization: None Tunneling: Yes Position (o'clock): 3 Maximum Distance: (cm) 1.6 Wound Description Classification: Category/Stage IV Wound Margin: Well defined, not attached Exudate Amount: Medium Exudate Type: Serosanguineous Exudate Color: red, brown Foul Odor After Cleansing: Yes Due to Product Use: No Slough/Fibrino Yes Wound Bed Granulation Amount: None Present (0%) Exposed Structure Necrotic Amount: Large (67-100%) Fat Layer (Subcutaneous Tissue) Exposed: Yes Necrotic Quality: Eschar, Adherent Slough Periwound Skin Texture Texture Color No  Abnormalities Noted: No No Abnormalities Noted: No Jamie Glass, Jamie Glass ([id number]) Callus: No Atrophie  Blanche: No Crepitus: No Cyanosis: No Excoriation: No Ecchymosis: No Induration: No Erythema: No Rash: No Hemosiderin Staining: Yes Scarring: No Mottled: No Pallor: No Moisture Rubor: No No Abnormalities Noted: No Dry / Scaly: No Maceration: No Wound Preparation Ulcer Cleansing: Rinsed/Irrigated with Saline Topical Anesthetic Applied: Other: lidocaine 4%, Electronic Signature(s) Signed: 07/15/2017 4:42:41 PM By: Curtis Sites Entered By: Curtis Sites on 07/15/2017 14:21:23 Jamie Glass, Jamie Glass ([id number]) -------------------------------------------------------------------------------- Vitals Details Patient Name: Jamie Glass Date of Service: 07/15/2017 1:15 PM Medical Record Number: [id number] Patient Account Number: [id number] Date of Birth/Sex: [birth year] (84 y.o. F) Treating RN: Curtis Sites Primary Care Gibson Telleria: Marcelino Duster Other Clinician: Referring Muskaan Smet: Marcelino Duster Treating Traylon Schimming/Extender: Linwood Dibbles, HOYT Weeks in Treatment: 1 Vital Signs Time Taken: 13:35 Temperature (F): 98.0 Pulse (bpm): 93 Respiratory Rate (breaths/min): 16 Blood Pressure (mmHg): 176/86 Reference Range: 80 - 120 mg / dl Electronic Signature(s) Signed: 08/03/2017 7:45:54 AM By: Arnette Norris Entered By: Arnette Norris on 07/15/2017 13:38:57

## 2017-09-05 ENCOUNTER — Other Ambulatory Visit: Payer: Self-pay

## 2017-09-05 ENCOUNTER — Inpatient Hospital Stay
Admission: EM | Admit: 2017-09-05 | Discharge: 2017-09-07 | DRG: 085 | Disposition: A | Discharge status: Skilled Nursing Facility | Payer: Medicare Other | Attending: Internal Medicine | Admitting: Internal Medicine

## 2017-09-05 ENCOUNTER — Emergency Department: Payer: Medicare Other

## 2017-09-05 DIAGNOSIS — I509 Heart failure, unspecified: Secondary | ICD-10-CM | POA: Diagnosis present

## 2017-09-05 DIAGNOSIS — T68XXXA Hypothermia, initial encounter: Secondary | ICD-10-CM

## 2017-09-05 DIAGNOSIS — I130 Hypertensive heart and chronic kidney disease with heart failure and stage 1 through stage 4 chronic kidney disease, or unspecified chronic kidney disease: Secondary | ICD-10-CM | POA: Diagnosis present

## 2017-09-05 DIAGNOSIS — E860 Dehydration: Secondary | ICD-10-CM | POA: Diagnosis present

## 2017-09-05 DIAGNOSIS — W19XXXA Unspecified fall, initial encounter: Secondary | ICD-10-CM | POA: Diagnosis present

## 2017-09-05 DIAGNOSIS — Z66 Do not resuscitate: Secondary | ICD-10-CM | POA: Diagnosis present

## 2017-09-05 DIAGNOSIS — Z882 Allergy status to sulfonamides status: Secondary | ICD-10-CM

## 2017-09-05 DIAGNOSIS — E1122 Type 2 diabetes mellitus with diabetic chronic kidney disease: Secondary | ICD-10-CM | POA: Diagnosis present

## 2017-09-05 DIAGNOSIS — S065X0A Traumatic subdural hemorrhage without loss of consciousness, initial encounter: Secondary | ICD-10-CM | POA: Diagnosis present

## 2017-09-05 DIAGNOSIS — Z8249 Family history of ischemic heart disease and other diseases of the circulatory system: Secondary | ICD-10-CM

## 2017-09-05 DIAGNOSIS — S065X9A Traumatic subdural hemorrhage with loss of consciousness of unspecified duration, initial encounter: Secondary | ICD-10-CM | POA: Diagnosis present

## 2017-09-05 DIAGNOSIS — Z515 Encounter for palliative care: Secondary | ICD-10-CM | POA: Diagnosis not present

## 2017-09-05 DIAGNOSIS — S065XAA Traumatic subdural hemorrhage with loss of consciousness status unknown, initial encounter: Secondary | ICD-10-CM | POA: Diagnosis present

## 2017-09-05 DIAGNOSIS — Z7982 Long term (current) use of aspirin: Secondary | ICD-10-CM | POA: Diagnosis not present

## 2017-09-05 DIAGNOSIS — R680 Hypothermia, not associated with low environmental temperature: Secondary | ICD-10-CM | POA: Diagnosis present

## 2017-09-05 DIAGNOSIS — F17210 Nicotine dependence, cigarettes, uncomplicated: Secondary | ICD-10-CM | POA: Diagnosis present

## 2017-09-05 DIAGNOSIS — Z794 Long term (current) use of insulin: Secondary | ICD-10-CM | POA: Diagnosis not present

## 2017-09-05 DIAGNOSIS — F419 Anxiety disorder, unspecified: Secondary | ICD-10-CM | POA: Diagnosis present

## 2017-09-05 DIAGNOSIS — L89154 Pressure ulcer of sacral region, stage 4: Secondary | ICD-10-CM | POA: Diagnosis present

## 2017-09-05 DIAGNOSIS — F0390 Unspecified dementia without behavioral disturbance: Secondary | ICD-10-CM | POA: Diagnosis present

## 2017-09-05 DIAGNOSIS — N189 Chronic kidney disease, unspecified: Secondary | ICD-10-CM | POA: Diagnosis present

## 2017-09-05 DIAGNOSIS — M1990 Unspecified osteoarthritis, unspecified site: Secondary | ICD-10-CM | POA: Diagnosis present

## 2017-09-05 DIAGNOSIS — Z79899 Other long term (current) drug therapy: Secondary | ICD-10-CM | POA: Diagnosis not present

## 2017-09-05 DIAGNOSIS — S0181XA Laceration without foreign body of other part of head, initial encounter: Secondary | ICD-10-CM | POA: Diagnosis present

## 2017-09-05 LAB — URINALYSIS, COMPLETE (UACMP) WITH MICROSCOPIC
Bacteria, UA: NONE SEEN
Bilirubin Urine: NEGATIVE
Glucose, UA: NEGATIVE mg/dL
Hgb urine dipstick: NEGATIVE
KETONES UR: NEGATIVE mg/dL
LEUKOCYTES UA: NEGATIVE
Nitrite: NEGATIVE
PROTEIN: 100 mg/dL — AB
Specific Gravity, Urine: 1.016 (ref 1.005–1.030)
WBC UA: NONE SEEN WBC/hpf (ref 0–5)
pH: 5 (ref 5.0–8.0)

## 2017-09-05 LAB — COMPREHENSIVE METABOLIC PANEL
ALBUMIN: 3.3 g/dL — AB (ref 3.5–5.0)
ALT: 22 U/L (ref 0–44)
ANION GAP: 14 (ref 5–15)
AST: 28 U/L (ref 15–41)
Alkaline Phosphatase: 76 U/L (ref 38–126)
BUN: 41 mg/dL — AB (ref 8–23)
CHLORIDE: 107 mmol/L (ref 98–111)
CO2: 17 mmol/L — ABNORMAL LOW (ref 22–32)
Calcium: 8.7 mg/dL — ABNORMAL LOW (ref 8.9–10.3)
Creatinine, Ser: 0.79 mg/dL (ref 0.44–1.00)
GFR calc Af Amer: >60 mL/min (ref >60)
GFR calc non Af Amer: >60 mL/min (ref >60)
GLUCOSE: 210 mg/dL — AB (ref 70–99)
POTASSIUM: 4.1 mmol/L (ref 3.5–5.1)
Sodium: 138 mmol/L (ref 135–145)
Total Bilirubin: 0.6 mg/dL (ref 0.3–1.2)
Total Protein: 7.8 g/dL (ref 6.5–8.1)

## 2017-09-05 LAB — CBC
HCT: 37.3 % (ref 35.0–47.0)
Hemoglobin: 12.1 g/dL (ref 12.0–16.0)
MCH: 26.2 pg (ref 26.0–34.0)
MCHC: 32.3 g/dL (ref 32.0–36.0)
MCV: 81.1 fL (ref 80.0–100.0)
PLATELETS: 238 10*3/uL (ref 150–440)
RBC: 4.6 MIL/uL (ref 3.80–5.20)
RDW: 18 % — AB (ref 11.5–14.5)
WBC: 7.2 10*3/uL (ref 3.6–11.0)

## 2017-09-05 LAB — GLUCOSE, CAPILLARY
Glucose-Capillary: 172 mg/dL — ABNORMAL HIGH (ref 70–99)
Glucose-Capillary: 218 mg/dL — ABNORMAL HIGH (ref 70–99)

## 2017-09-05 LAB — MRSA PCR SCREENING: MRSA by PCR: NEGATIVE

## 2017-09-05 MED ORDER — OLANZAPINE 5 MG PO TABS
5.0000 mg | ORAL_TABLET | Freq: Every day | ORAL | Status: DC
Start: 2017-09-05 — End: 2017-09-06
  Administered 2017-09-05: 5 mg via ORAL
  Filled 2017-09-05 (×2): qty 1

## 2017-09-05 MED ORDER — ONDANSETRON HCL 4 MG PO TABS: 4.0000 mg | ORAL_TABLET | Freq: Four times a day (QID) | ORAL | Status: DC | PRN

## 2017-09-05 MED ORDER — ACETAMINOPHEN 650 MG RE SUPP: 650.0000 mg | Freq: Four times a day (QID) | RECTAL | Status: DC | PRN

## 2017-09-05 MED ORDER — ONDANSETRON HCL 4 MG/2ML IJ SOLN: 4.0000 mg | Freq: Four times a day (QID) | INTRAMUSCULAR | Status: DC | PRN

## 2017-09-05 MED ORDER — SODIUM CHLORIDE 0.9 % IV SOLN
INTRAVENOUS | Status: DC
  Administered 2017-09-05 – 2017-09-06 (×2): via INTRAVENOUS

## 2017-09-05 MED ORDER — SENNOSIDES-DOCUSATE SODIUM 8.6-50 MG PO TABS: 1.0000 | ORAL_TABLET | Freq: Every evening | ORAL | Status: DC | PRN

## 2017-09-05 MED ORDER — INSULIN ASPART 100 UNIT/ML ~~LOC~~ SOLN: 0.0000 [IU] | Freq: Three times a day (TID) | SUBCUTANEOUS | Status: DC

## 2017-09-05 MED ORDER — PRAVASTATIN SODIUM 20 MG PO TABS
20.0000 mg | ORAL_TABLET | Freq: Every day | ORAL | Status: DC
Start: 2017-09-05 — End: 2017-09-06
  Administered 2017-09-05: 20 mg via ORAL
  Filled 2017-09-05: qty 1

## 2017-09-05 MED ORDER — INSULIN ASPART 100 UNIT/ML ~~LOC~~ SOLN
0.0000 [IU] | Freq: Every day | SUBCUTANEOUS | Status: DC
  Administered 2017-09-05: 22:00:00 2 [IU] via SUBCUTANEOUS
  Filled 2017-09-05: qty 1

## 2017-09-05 MED ORDER — ACETAMINOPHEN 325 MG PO TABS: 650.0000 mg | ORAL_TABLET | Freq: Four times a day (QID) | ORAL | Status: DC | PRN

## 2017-09-05 NOTE — ED Provider Notes (Signed)
[facility] Emergency Department Provider Note   ____________________________________________    I have reviewed the triage vital signs and the nursing notes.   HISTORY  Chief Complaint Fall   History limited by dementia  HPI Jamie Glass is a 84 y.o. female with a history of dementia who presents after witnessed fall that apparently occurred at 6 AM this morning.  Patient is unable to provide any details.  Reportedly the patient is at her baseline   Past Medical History:  Diagnosis Date  . Anxiety   . Arthritis   . CHF (congestive heart failure) (HCC)   . Chronic kidney disease   . Dementia   . Diabetes mellitus without complication (HCC)   . Hypertension   . Stress headaches 08/21/2014    Patient Active Problem List   Diagnosis Date Noted  . Altered mental status 07/16/2017  . Dementia 07/16/2017  . UTI (urinary tract infection) 07/16/2017  . Hyperglycemia 07/16/2017  . Sepsis (HCC) 02/25/2016  . Pressure injury of skin 02/25/2016  . Pressure ulcer 11/03/2014  . Protein-calorie malnutrition, severe (HCC) 11/02/2014  . Acute renal failure (HCC) 10/31/2014  . Hyperkalemia 10/31/2014    History reviewed. No pertinent surgical history.  Prior to Admission medications   Medication Sig Start Date End Date Taking? Authorizing Provider  amLODipine (NORVASC) 10 MG tablet Take 1 tablet (10 mg total) by mouth daily. 02/28/16  Yes Adrian Saran, MD  aspirin EC 81 MG tablet Take 81 mg by mouth daily.   Yes [provider]  gabapentin (NEURONTIN) 100 MG capsule Take 100 mg by mouth at bedtime.    Yes [provider]  insulin aspart (NOVOLOG) 100 UNIT/ML injection Inject 2-10 Units into the skin 3 (three) times daily before meals. Give per sliding scale: BS 0-150 = 0 units BS 151-200 = 2 units BS 201-250 = 4 units BS 251-300 = 6 units BS 301-350 = 8 units BS >350 = 10 units and recheck BS in 2 hours; call MD   Yes [provider]  isosorbide mononitrate (IMDUR) 60 MG 24 hr tablet Take 1 tablet (60 mg total) by mouth daily. 02/28/16  Yes Mody, Patricia Pesa, MD  metoprolol tartrate (LOPRESSOR) 25 MG tablet Take 25 mg by mouth 2 (two) times daily.   Yes [provider]  OLANZapine (ZYPREXA) 5 MG tablet Take 5 mg by mouth at bedtime.   Yes [provider]  pravastatin (PRAVACHOL) 20 MG tablet Take 20 mg by mouth at bedtime.    Yes [provider]     Allergies Sulfa antibiotics  Family History  Problem Relation Age of Onset  . Hypertension Other     Social History Social History   Tobacco Use  . Smoking status: Current Every Day Smoker    Packs/day: 0.50    Years: 84.00    Pack years: 42.00    Types: Cigarettes  . Smokeless tobacco: Never Used  Substance Use Topics  . Alcohol use: No  . Drug use: No    Level 5 caveat: Unable to obtain review of Systems due to altered mental status    ____________________________________________   PHYSICAL EXAM:  VITAL SIGNS: ED Triage Vitals  Enc Vitals Group     BP 09/05/17 1350 135/63     Pulse Rate 09/05/17 1350 70     Resp 09/05/17 1350 (!) 22     Temp 09/05/17 1350 (!) 95.3 F (35.2 C)     Temp Source 09/05/17  1350 Rectal     SpO2 09/05/17 1350 94 %     Weight 09/05/17 1345 54.4 kg (120 lb)     Height 09/05/17 1345 1.6 m (5\' 3" )     Head Circumference --      Peak Flow --      Pain Score --      Pain Loc --      Pain Edu? --      Excl. in GC? --     Constitutional: Alert, does not follow simple commands Eyes: Conjunctivae are normal.  Head: 3 cm laceration right forehead, well approximated Nose: No congestion/rhinnorhea. Mouth/Throat: Mucous membranes are moist.   Neck: No vertebral tenderness to palpation Cardiovascular: Normal rate, regular rhythm. Grossly normal heart sounds.   Respiratory: Normal respiratory effort.  No retractions. Gastrointestinal: Soft and nontender. No distention.     Musculoskeletal:  Warm and well perfused Neurologic: Patient is currently not speaking, unclear what her baseline is, no gross neurological deficits noted Skin:  Skin is warm, dry and intact. No rash noted.   ____________________________________________   LABS (all labs ordered are listed, but only abnormal results are displayed)  Labs Reviewed  CBC - Abnormal; Notable for the following components:      Result Value   RDW 18.0 (*)    All other components within normal limits  COMPREHENSIVE METABOLIC PANEL - Abnormal; Notable for the following components:   CO2 17 (*)    Glucose, Bld 210 (*)    BUN 41 (*)    Calcium 8.7 (*)    Albumin 3.3 (*)    All other components within normal limits  URINALYSIS, COMPLETE (UACMP) WITH MICROSCOPIC   ____________________________________________  EKG  None ____________________________________________  RADIOLOGY  CT head and cervical spine ____________________________________________   PROCEDURES  Procedure(s) performed: yes    .Marland Kitchen.Laceration Repair Date/Time: 09/05/2017 2:56 PM Performed by: Jene EveryKinner, Eann Cleland, MD Authorized by: Jene EveryKinner, Orest Dygert, MD   Anesthesia (see MAR for exact dosages):    Anesthesia method:  None Laceration details:    Location:  Scalp   Scalp location:  Frontal   Length (cm):  3 Skin repair:    Repair method:  Steri-Strips   Number of Steri-Strips:  5 Approximation:    Approximation:  Close Post-procedure details:    Dressing:  Adhesive bandage   Patient tolerance of procedure:  Tolerated well, no immediate complications     Critical Care performed: No ____________________________________________   INITIAL IMPRESSION / ASSESSMENT AND PLAN / ED COURSE  Pertinent labs & imaging results that were available during my care of the patient were reviewed by me and considered in my medical decision making (see chart for details).  Patient presents after head injury that apparently occurred overnight.   Here for laceration repair.  Nurses found the patient to be mildly hypothermic, warm blankets and bear hugger initiated.  Will send for CT given traumatic injury.   Called by radiologist notified of small subdural hematoma.  Discussed with neurosurgery.  Discussed with the patient's legal guardian who agrees with observing in the hospital, repeat CT scan and then discharge if no significant change.  Absolutely does not want any surgery or heroic measures performed    ____________________________________________   FINAL CLINICAL IMPRESSION(S) / ED DIAGNOSES  Final diagnoses:  Subdural hematoma (HCC)  Hypothermia, initial encounter        Note:  This document was prepared using Dragon voice recognition software and may include unintentional dictation errors.    Jene EveryKinner, Adelei Scobey,  MD 09/05/17 1526

## 2017-09-05 NOTE — ED Triage Notes (Addendum)
Pt comes via [facility] from [facility] after witness fall around 6am this morning. Pt has bandage covering right side of head above eye. Per EMS staff stated it was small laceration and they dressed it. Pt has hx of dementia and is acting at normal baseline. VSS, BS-209. Per EMS pt was given all regular medications by facility staff.

## 2017-09-05 NOTE — ED Notes (Signed)
Pt had small bowel movement. Pt cleaned up and repositioned at this time.

## 2017-09-05 NOTE — Progress Notes (Signed)
Advanced care plan. Purpose of the Encounter: CODE STATUS Parties in Attendance: Patient and legal guardian Patient's Decision Capacity: Not good Subjective/Patient's story: Presented with fall and head injury Objective/Medical story Has subdural hematoma Goals of care determination:  Advance care directives and goals of care discussed with patient's legal guardian They do not want any cardiac resuscitation, intubation or ventilator if the need arises Do not want any aggressive surgeries Want supportive care CODE STATUS: DNR Time spent discussing advanced care planning: 16 minutes

## 2017-09-05 NOTE — ED Notes (Signed)
Warm blankets applied to pt.  

## 2017-09-05 NOTE — ED Notes (Signed)
Patient transported to CT 

## 2017-09-05 NOTE — ED Notes (Addendum)
Pt cleaned up and repositioned at this time. Pt had small bowel movement.

## 2017-09-05 NOTE — ED Notes (Signed)
Attempted to call report per Ginger Secretary Tasha RN will call back. She is currently tied up with a patient.

## 2017-09-05 NOTE — H&P (Signed)
[facility] [place] [facility]   PATIENT NAME: Jamie Glass    MR#:  [id number]  DATE OF BIRTH:  11-15-1927  DATE OF ADMISSION:  09/05/2017  PRIMARY CARE PHYSICIAN: Gracelyn Nurse, MD   REQUESTING/REFERRING PHYSICIAN:   CHIEF COMPLAINT:   Chief Complaint  Patient presents with  . Fall    HISTORY OF PRESENT ILLNESS: Jamie Glass  is a 84 y.o. female with a known history of dementia type 2 diabetes mellitus, hypertension, chronic kidney disease, congestive heart failure, arthritis he is a resident of [facility].  Patient had a witnessed fall and head injury.  She has a laceration over the right frontal region which was sutured in the emergency room.  Patient was worked up with CT head which showed 5 mm subdural hematoma.  Patient's legal guardian Caleen Essex patient such as neurosurgery.  Patient is DNR by CODE STATUS.  Unable to give any history secondary to dementia.  Her rectal temperature was low around 95 F and patient was put on warming blanket in the emergency room.  Hospitalist service was consulted for further care.  PAST MEDICAL HISTORY:   Past Medical History:  Diagnosis Date  . Anxiety   . Arthritis   . CHF (congestive heart failure) (HCC)   . Chronic kidney disease   . Dementia   . Diabetes mellitus without complication (HCC)   . Hypertension   . Stress headaches 08/21/2014    PAST SURGICAL HISTORY: History reviewed. No pertinent surgical history.  SOCIAL HISTORY:  Social History   Tobacco Use  . Smoking status: Current Every Day Smoker    Packs/day: 0.50    Years: 84.00    Pack years: 42.00    Types: Cigarettes  . Smokeless tobacco: Never Used  Substance Use Topics  . Alcohol use: No    FAMILY HISTORY:  Family History  Problem Relation Age of Onset  . Hypertension Other     DRUG ALLERGIES:  Allergies  Allergen Reactions  . Sulfa Antibiotics Other (See Comments)    Unknown reaction- listed on MAR.     REVIEW OF SYSTEMS:  Could not be obtained secondary to advanced dementia MEDICATIONS AT HOME:  Prior to Admission medications   Medication Sig Start Date End Date Taking? Authorizing Provider  amLODipine (NORVASC) 10 MG tablet Take 1 tablet (10 mg total) by mouth daily. 02/28/16  Yes Adrian Saran, MD  aspirin EC 81 MG tablet Take 81 mg by mouth daily.   Yes [provider]  gabapentin (NEURONTIN) 100 MG capsule Take 100 mg by mouth at bedtime.    Yes [provider]  insulin aspart (NOVOLOG) 100 UNIT/ML injection Inject 2-10 Units into the skin 3 (three) times daily before meals. Give per sliding scale: BS 0-150 = 0 units BS 151-200 = 2 units BS 201-250 = 4 units BS 251-300 = 6 units BS 301-350 = 8 units BS >350 = 10 units and recheck BS in 2 hours; call MD   Yes [provider]  isosorbide mononitrate (IMDUR) 60 MG 24 hr tablet Take 1 tablet (60 mg total) by mouth daily. 02/28/16  Yes Mody, Patricia Pesa, MD  metoprolol tartrate (LOPRESSOR) 25 MG tablet Take 25 mg by mouth 2 (two) times daily.   Yes [provider]  OLANZapine (ZYPREXA) 5 MG tablet Take 5 mg by mouth at bedtime.   Yes [provider]  pravastatin (PRAVACHOL) 20 MG tablet Take 20 mg by mouth at bedtime.  Yes [provider]      PHYSICAL EXAMINATION:   VITAL SIGNS: Blood pressure (!) 144/75, pulse 76, temperature (!) 95.6 F (35.3 C), temperature source Rectal, resp. rate (!) 8, height 5\' 3"  (1.6 m), weight 54.4 kg (120 lb), SpO2 97 %.  GENERAL:  84 y.o.-year-old patient lying in the bed with no acute distress.  EYES: Pupils equal, round, reactive to light and accommodation. No scleral icterus. Extraocular muscles intact.  HEENT: Head atraumatic, normocephalic. Oropharynx and nasopharynx clear.  NECK:  Supple, no jugular venous distention. No thyroid enlargement, no tenderness.  LUNGS: Normal breath sounds bilaterally, no wheezing, rales,rhonchi or crepitation. No  use of accessory muscles of respiration.  CARDIOVASCULAR: S1, S2 normal. No murmurs, rubs, or gallops.  ABDOMEN: Soft, nontender, nondistended. Bowel sounds present. No organomegaly or mass.  EXTREMITIES: No pedal edema, cyanosis, or clubbing.  NEUROLOGIC: Awake and responds to verbal commands Not oriented to time, place and person  Gait not checked.  PSYCHIATRIC: could not be assessed SKIN: No obvious rash, lesion, or ulcer.   LABORATORY PANEL:   CBC Recent Labs  Lab 09/05/17 1405  WBC 7.2  HGB 12.1  HCT 37.3  PLT 238  MCV 81.1  MCH 26.2  MCHC 32.3  RDW 18.0*   ------------------------------------------------------------------------------------------------------------------  Chemistries  Recent Labs  Lab 09/05/17 1405  NA 138  K 4.1  CL 107  CO2 17*  GLUCOSE 210*  BUN 41*  CREATININE 0.79  CALCIUM 8.7*  AST 28  ALT 22  ALKPHOS 76  BILITOT 0.6   ------------------------------------------------------------------------------------------------------------------ estimated creatinine clearance is 40.2 mL/min (by C-G formula based on SCr of 0.79 mg/dL). ------------------------------------------------------------------------------------------------------------------ No results for input(s): TSH, T4TOTAL, T3FREE, THYROIDAB in the last 72 hours.  Invalid input(s): FREET3   Coagulation profile No results for input(s): INR, PROTIME in the last 168 hours. ------------------------------------------------------------------------------------------------------------------- No results for input(s): DDIMER in the last 72 hours. -------------------------------------------------------------------------------------------------------------------  Cardiac Enzymes No results for input(s): CKMB, TROPONINI, MYOGLOBIN in the last 168 hours.  Invalid input(s):  CK ------------------------------------------------------------------------------------------------------------------ Invalid input(s): POCBNP  ---------------------------------------------------------------------------------------------------------------  Urinalysis    Component Value Date/Time   COLORURINE YELLOW (A) 09/05/2017 1620   APPEARANCEUR CLEAR (A) 09/05/2017 1620   APPEARANCEUR Clear 11/26/2013 1028   LABSPEC 1.016 09/05/2017 1620   LABSPEC 1.025 11/26/2013 1028   PHURINE 5.0 09/05/2017 1620   GLUCOSEU NEGATIVE 09/05/2017 1620   GLUCOSEU Negative 11/26/2013 1028   HGBUR NEGATIVE 09/05/2017 1620   BILIRUBINUR NEGATIVE 09/05/2017 1620   BILIRUBINUR Negative 11/26/2013 1028   KETONESUR NEGATIVE 09/05/2017 1620   PROTEINUR 100 (A) 09/05/2017 1620   NITRITE NEGATIVE 09/05/2017 1620   LEUKOCYTESUR NEGATIVE 09/05/2017 1620   LEUKOCYTESUR Negative 11/26/2013 1028     RADIOLOGY: Ct Head Wo Contrast  Result Date: 09/05/2017 CLINICAL DATA:  Fall. EXAM: CT HEAD WITHOUT CONTRAST CT CERVICAL SPINE WITHOUT CONTRAST TECHNIQUE: Multidetector CT imaging of the head and cervical spine was performed following the standard protocol without intravenous contrast. Multiplanar CT image reconstructions of the cervical spine were also generated. COMPARISON:  Multiple prior head CTs, most recently dated Jul 16, 2017. MR cervical spine dated December 27, 2013. FINDINGS: CT HEAD FINDINGS Brain: New right cerebral convexity subdural hematoma measuring up to 5 mm in maximal thickness. Small foci of subarachnoid hemorrhage along the right frontal lobe, the left frontoparietal region, and the left tentorium cerebelli. No midline shift. The basal cisterns are patent. No evidence of acute infarction, hydrocephalus, or mass lesion. Stable cerebral atrophy and chronic microvascular ischemic changes.  Unchanged lacunar infarcts in the left basal ganglia, left thalamus, and both cerebellar hemispheres. Old infarcts  in the right frontal and posterior parietal lobes again noted. Vascular: Calcified atherosclerosis at the skullbase. No hyperdense vessel. Skull: Negative for fracture or focal lesion. Sinuses/Orbits: No acute finding. Other: None. CT CERVICAL SPINE FINDINGS Alignment: No traumatic malalignment. Skull base and vertebrae: No acute fracture. No primary bone lesion or focal pathologic process. Soft tissues and spinal canal: No prevertebral fluid or swelling. No visible canal hematoma. Disc levels: Moderate disc height loss and facet uncovertebral hypertrophy from C5-C6 through C7-T1. Moderate left greater than right facet arthropathy in the upper cervical spine. Moderate atlantodental degenerative changes, asymmetric to the right. Upper chest: Mild centrilobular emphysema. Other: 8 mm hypodense nodule in the right thyroid lobe. Bilateral carotid artery calcific atherosclerosis. IMPRESSION: 1. Small right cerebral convexity subdural hematoma measuring up to 5 mm in maximal thickness. No midline shift. 2. Scattered small foci of subarachnoid hemorrhage bilaterally. 3.  No acute cervical spine fracture. Critical Value/emergent results were called by telephone at the time of interpretation on 09/05/2017 at 3:10 pm to Dr. Jene Every , who verbally acknowledged these results. Electronically Signed   By: Obie Dredge M.D.   On: 09/05/2017 15:11   Ct Cervical Spine Wo Contrast  Result Date: 09/05/2017 CLINICAL DATA:  Fall. EXAM: CT HEAD WITHOUT CONTRAST CT CERVICAL SPINE WITHOUT CONTRAST TECHNIQUE: Multidetector CT imaging of the head and cervical spine was performed following the standard protocol without intravenous contrast. Multiplanar CT image reconstructions of the cervical spine were also generated. COMPARISON:  Multiple prior head CTs, most recently dated Jul 16, 2017. MR cervical spine dated December 27, 2013. FINDINGS: CT HEAD FINDINGS Brain: New right cerebral convexity subdural hematoma measuring up to 5 mm  in maximal thickness. Small foci of subarachnoid hemorrhage along the right frontal lobe, the left frontoparietal region, and the left tentorium cerebelli. No midline shift. The basal cisterns are patent. No evidence of acute infarction, hydrocephalus, or mass lesion. Stable cerebral atrophy and chronic microvascular ischemic changes. Unchanged lacunar infarcts in the left basal ganglia, left thalamus, and both cerebellar hemispheres. Old infarcts in the right frontal and posterior parietal lobes again noted. Vascular: Calcified atherosclerosis at the skullbase. No hyperdense vessel. Skull: Negative for fracture or focal lesion. Sinuses/Orbits: No acute finding. Other: None. CT CERVICAL SPINE FINDINGS Alignment: No traumatic malalignment. Skull base and vertebrae: No acute fracture. No primary bone lesion or focal pathologic process. Soft tissues and spinal canal: No prevertebral fluid or swelling. No visible canal hematoma. Disc levels: Moderate disc height loss and facet uncovertebral hypertrophy from C5-C6 through C7-T1. Moderate left greater than right facet arthropathy in the upper cervical spine. Moderate atlantodental degenerative changes, asymmetric to the right. Upper chest: Mild centrilobular emphysema. Other: 8 mm hypodense nodule in the right thyroid lobe. Bilateral carotid artery calcific atherosclerosis. IMPRESSION: 1. Small right cerebral convexity subdural hematoma measuring up to 5 mm in maximal thickness. No midline shift. 2. Scattered small foci of subarachnoid hemorrhage bilaterally. 3.  No acute cervical spine fracture. Critical Value/emergent results were called by telephone at the time of interpretation on 09/05/2017 at 3:10 pm to Dr. Jene Every , who verbally acknowledged these results. Electronically Signed   By: Obie Dredge M.D.   On: 09/05/2017 15:11    EKG: Orders placed or performed during the hospital encounter of 07/16/17  . EKG 12-Lead  . EKG 12-Lead    IMPRESSION AND  PLAN:  84 year old elderly female  patient with history of dementia, hypertension, diabetes mellitus type 2, congestive heart failure, hypertension presented to the emergency room for fall and head injury.  -Subdural hematoma Patient's legal guardian does not want any aggressive measures such as neurosurgery and intervention Only supportive care Repeat CT head in the morning  -Scattered subarachnoid hemorrhage Monitoring  -Dehydration IV fluids  -Hypothermia Warming blanket  check urinalysis for infection  -DVT prophylaxis sequential compression device to lower extremities   All the records are reviewed and case discussed with ED provider. Management plans discussed with the patient, family and they are in agreement.  CODE STATUS: DNR  Code Status History    Date Active Date Inactive Code Status Order ID Comments User Context   07/16/2017 1704 07/20/2017 2145 Full Code [id number]  Marguarite ArbourSparks, Jeffrey D, MD Inpatient   02/25/2016 1744 02/28/2016 1932 Full Code [id number]  Adrian SaranMody, Sital, MD Inpatient   10/31/2014 2027 11/03/2014 1717 Full Code [id number]  Hower, Cletis Athensavid K, MD ED       TOTAL TIME TAKING CARE OF THIS PATIENT: 51 minutes.    Ihor AustinPavan Pyreddy M.D on 09/05/2017 at 5:05 PM  Between 7am to 6pm - Pager - [phone]  After 6pm go to [email or web address] - password [email or web address] [facility]  [facility]  Office  [phone]  CC: Primary care physician; Gracelyn NurseJohnston, John D, MD

## 2017-09-05 NOTE — ED Notes (Signed)
Called legal guardian and left voicemail regarding pt's status.

## 2017-09-06 ENCOUNTER — Inpatient Hospital Stay: Payer: Medicare Other

## 2017-09-06 LAB — BASIC METABOLIC PANEL
Anion gap: 9 (ref 5–15)
BUN: 29 mg/dL — AB (ref 8–23)
CALCIUM: 8.4 mg/dL — AB (ref 8.9–10.3)
CHLORIDE: 110 mmol/L (ref 98–111)
CO2: 21 mmol/L — AB (ref 22–32)
CREATININE: 0.77 mg/dL (ref 0.44–1.00)
GFR calc non Af Amer: >60 mL/min (ref >60)
GLUCOSE: 126 mg/dL — AB (ref 70–99)
Potassium: 3.7 mmol/L (ref 3.5–5.1)
Sodium: 140 mmol/L (ref 135–145)

## 2017-09-06 LAB — CBC
HEMATOCRIT: 34.2 % — AB (ref 35.0–47.0)
Hemoglobin: 11.1 g/dL — ABNORMAL LOW (ref 12.0–16.0)
MCH: 25.9 pg — AB (ref 26.0–34.0)
MCHC: 32.5 g/dL (ref 32.0–36.0)
MCV: 79.7 fL — ABNORMAL LOW (ref 80.0–100.0)
Platelets: 209 10*3/uL (ref 150–440)
RBC: 4.3 MIL/uL (ref 3.80–5.20)
RDW: 17.3 % — AB (ref 11.5–14.5)
WBC: 5 10*3/uL (ref 3.6–11.0)

## 2017-09-06 LAB — GLUCOSE, CAPILLARY
GLUCOSE-CAPILLARY: 109 mg/dL — AB (ref 70–99)
GLUCOSE-CAPILLARY: 120 mg/dL — AB (ref 70–99)

## 2017-09-06 MED ORDER — ORAL CARE MOUTH RINSE
15.0000 mL | Freq: Two times a day (BID) | OROMUCOSAL | Status: DC
  Administered 2017-09-06 – 2017-09-07 (×2): 15 mL via OROMUCOSAL

## 2017-09-06 MED ORDER — ACETAMINOPHEN 325 MG PO TABS: 650.0000 mg | ORAL_TABLET | Freq: Four times a day (QID) | ORAL | Status: DC | PRN

## 2017-09-06 MED ORDER — MORPHINE SULFATE (PF) 2 MG/ML IV SOLN: 1.0000 mg | INTRAVENOUS | Status: DC | PRN

## 2017-09-06 MED ORDER — MORPHINE SULFATE (CONCENTRATE) 10 MG/0.5ML PO SOLN: 5.0000 mg | ORAL | Status: DC | PRN

## 2017-09-06 MED ORDER — ACETAMINOPHEN 650 MG RE SUPP: 650.0000 mg | Freq: Four times a day (QID) | RECTAL | Status: DC | PRN

## 2017-09-06 MED ORDER — LORAZEPAM 1 MG PO TABS: 1.0000 mg | ORAL_TABLET | ORAL | Status: DC | PRN

## 2017-09-06 MED ORDER — LORAZEPAM 2 MG/ML PO CONC: 1.0000 mg | ORAL | Status: DC | PRN

## 2017-09-06 MED ORDER — LORAZEPAM 2 MG/ML IJ SOLN: 1.0000 mg | INTRAMUSCULAR | Status: DC | PRN

## 2017-09-06 NOTE — Consult Note (Signed)
WOC Nurse wound consult note [facility]. A foam dressing was present at the time of assessment. 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 Periwound: Contracted, normal color Dressing procedure/placement/frequency: Apply algisite alginate in the wound bed, cover with a foam dressing.  Change every 3 days and prn. Do not expect this wound to heal secondary to poor nutritional status and rolled wound edges.  Consider a nutritional consult to maximize nutritional intake. Monitor the wound area(s) for worsening of condition such as: Signs/symptoms of infection,  Increase in size,  Development of or worsening of odor, Development of pain, or increased pain at the affected locations.  Notify the medical team if any of these develop.  Thank you for the consult.  Discussed plan of care with the patient and bedside nurse.  WOC nurse will not follow at this time.  Please re-consult the WOC team if needed.  Helmut MusterSherry Darleen Moffitt, RN, MSN, CWOCN, CNS-BC, pager [phone]

## 2017-09-06 NOTE — Clinical Social Work Note (Signed)
Clinical Social Work Assessment  Patient Details  Name: Jamie FilesBernice Glass MRN: [id number] Date of Birth: 11/12/1927  Date of referral:  09/06/17               Reason for consult:  Facility Placement                Permission sought to share information with:  Case Manager, Magazine features editoracility Contact Representative, Family Supports Permission granted to share information::  Yes, Verbal Permission Granted  Name::        Agency::     Relationship::     Contact Information:     Housing/Transportation Living arrangements for the past 2 months:  Skilled Building surveyorursing Facility Source of Information:  Facility Patient Interpreter Needed:  None Criminal Activity/Legal Involvement Pertinent to Current Situation/Hospitalization:  No - Comment as needed Significant Relationships:  Adult Children Lives with:  Facility Resident Do you feel safe going back to the place where you live?  Yes Need for family participation in patient care:  Yes (Comment)  Care giving concerns:  Patient is a long term resident at [facility].    Social Worker assessment / plan:  CSW noted in chart review that patient is a long term resident at [facility]. CSW attempted to meet with patient but she is unable to be assessed due to Dementia and confusion. CSW attempted to call patient's guardian Saintclair HalstedRhesha Carr [phone] but had to leave a voicemail. CSW spoke with Raiford Nobleick, admissions coordinator at [facility]. Raiford NobleRick states that patient is a long term resident at [facility] and can return when ready. CSW will continue to follow for discharge planning.   Employment status:  Retired Database administratornsurance information:  Managed Medicare PT Recommendations:  Not assessed at this time Information / Referral to community resources:     Patient/Family's Response to care:  Patient is confused   Patient/Family's Understanding of and Emotional Response to Diagnosis, Current Treatment, and Prognosis:  Patient has a guardian.   Emotional Assessment Appearance:  Appears  stated age Attitude/Demeanor/Rapport:    Affect (typically observed):  Unable to Assess Orientation:    Alcohol / Substance use:  Not Applicable Psych involvement (Current and /or in the community):  No (Comment)  Discharge Needs  Concerns to be addressed:  Discharge Planning Concerns Readmission within the last 30 days:  No Current discharge risk:  None Barriers to Discharge:  Continued Medical Work up   Valero EnergyCandace  Stephannie Broner, LCSWA 09/06/2017, 10:43 AM

## 2017-09-06 NOTE — Progress Notes (Signed)
Advanced care plan.  Purpose of the Encounter: comfort care Parties in Attendance: D/w patient health care power of attorney Lafayette DragonCarr, Rlessa   Patient's Decision Capacity: Not intact  Subjective/Patient's story: Patient is 84 year old history of hypertension diabetes type 2, dementia and congestive heart failure admitted with subdural hematoma Patient has progression of the subdural hematoma currently unresponsive  Objective/Medical story  I discussed with Thressa Shellerarr, Rlessa patient's healthcare power of attorney signed by the state, regarding patient's worsening subdural hematoma.  And recommended patient be comfort care.  Reports that she will be coming to see the patient later today and making the decision  Goals of care determination:   Plan for comfort care once evaluated by her healthcare power of attorney  CODE STATUS: DNR   Time spent discussing advanced care planning: 16 minutes

## 2017-09-06 NOTE — Clinical Social Work Note (Signed)
Patient's guardian Jamie Glass [phone] called CSW back to confirm that patient is from [facility]. Guardian states that after speaking with the doctor today she has agreed to patient being comfort care. Per guardian if patient continues to decline they would be [facility]. CSW will continue to follow for discharge planning.   Jamie Glass MSW, [address] Archer RdCSWA [phone]

## 2017-09-06 NOTE — Progress Notes (Signed)
Initial Nutrition Assessment  DOCUMENTATION CODES:   Severe malnutrition in context of chronic illness, Underweight  INTERVENTION:  Will hold off on any nutrition interventions today and monitor goals of care. The recommendation is for patient to transition to comfort care today.  If patient does not transition to comfort care, recommend: -Liberalization of diet to regular -Ensure Enlive po TID, each supplement provides 350 kcal and 20 grams of protein -Magic cup TID with meals, each supplement provides 290 kcal and 9 grams of protein -MVI daily -Ocuvite daily to promote wound healing -Vitamin C 500 mg BID to promote wound healing  NUTRITION DIAGNOSIS:   Severe Malnutrition related to chronic illness(dementia, CHF, CKD) as evidenced by severe fat depletion, severe muscle depletion.  GOAL:   Patient will meet greater than or equal to 90% of their needs  MONITOR:   PO intake, Supplement acceptance, Labs, Weight trends, Skin, I & O's  REASON FOR ASSESSMENT:   Malnutrition Screening Tool    ASSESSMENT:   84 year old female with PMHx of dementia, anxiety, DM, HTN, CHF, arthritis, CKD who is now admitted after a fall found to have subdural hematoma, scattered subarachnoid hemorrhage, dehydration, and hypothermia.   -CT head from this AM shows increase in size of broad-based right-sided subdural hematoma.  Patient lethargic and unable to provide any history. No family members at bedside.   Reviewed chart and discussed with RN. Patient will likely be transitioning to comfort care. Per weight history in chart she was 135.4 lbs on 02/25/2016 and is currently 100.5 lbs. She has lost 34.9 lbs (25.8% body weight) sometime over the past 1.5 years.  Meal Completion: 0%  Medications reviewed and include: Novolog 0-9 units TID, Novolog 0-5 units QHS, Zyprexa, NS @ 75 mL/hr.  Labs reviewed: CBG 109-218, CO2 21, BUN 29.  NUTRITION - FOCUSED PHYSICAL EXAM:    Most Recent Value   Orbital Region  Severe depletion  Upper Arm Region  Severe depletion  Thoracic and Lumbar Region  Severe depletion  Buccal Region  Severe depletion  Temple Region  Severe depletion  Clavicle Bone Region  Severe depletion  Clavicle and Acromion Bone Region  Severe depletion  Scapular Bone Region  Unable to assess  Dorsal Hand  Severe depletion  Patellar Region  Severe depletion  Anterior Thigh Region  Severe depletion  Posterior Calf Region  Severe depletion  Edema (RD Assessment)  None  Hair  Reviewed  Eyes  Unable to assess  Mouth  Unable to assess  Skin  Reviewed  Nails  Reviewed     Diet Order:   Diet Order           Diet Heart Room service appropriate? Yes; Fluid consistency: Thin  Diet effective now          EDUCATION NEEDS:   Not appropriate for education at this time  Skin:  Skin Assessment: Skin Integrity Issues: Skin Integrity Issues:: Stage IV Stage IV: sacrum (4.2cm x 3cm x 1cm); per [facility] note wound unlikely to heal in setting of poor nutritional status and rolled wound edges (epibole)  Last BM:  09/06/2017 - type 6  Height:   Ht Readings from Last 1 Encounters:  09/05/17 5\' 3"  (1.6 m)    Weight:   Wt Readings from Last 1 Encounters:  09/05/17 100 lb 8.5 oz (45.6 kg)    Ideal Body Weight:  52.3 kg  BMI:  Body mass index is 17.81 kg/m.  Estimated Nutritional Needs:   Kcal:  1370-1600 (  30-35 kcal/kg)  Protein:  70-80 grams (1.5-1.8 grams/kg)  Fluid:  1.4-1.6 L/day (1 mL/kcal)  Helane RimaLeanne Margarette Vannatter, MS, RD, LDN Office: [phone] Pager: [phone] After Hours/Weekend Pager: [phone]

## 2017-09-06 NOTE — NC FL2 (Signed)
[place] MEDICAID FL2 LEVEL OF CARE SCREENING TOOL     IDENTIFICATION  Patient Name: Jamie Glass Birthdate: Dec 16, 1927 Sex: female Admission Date (Current Location): 09/05/2017  [facility] and [place] Number:  Chiropodist and Address:  [facility], [address], [place], [place] [address]      Provider Number: [id number]  Attending Physician Name and Address:  Jamie Bilberry, MD  Relative Name and Phone Number:       Current Level of Care: Hospital Recommended Level of Care: Skilled Nursing Facility Prior Approval Number:    Date Approved/Denied:   PASRR Number:    Discharge Plan: SNF    Current Diagnoses: Patient Active Problem List   Diagnosis Date Noted  . Subdural hematoma (HCC) 09/05/2017  . Altered mental status 07/16/2017  . Dementia 07/16/2017  . UTI (urinary tract infection) 07/16/2017  . Hyperglycemia 07/16/2017  . Sepsis (HCC) 02/25/2016  . Pressure injury of skin 02/25/2016  . Pressure ulcer 11/03/2014  . Protein-calorie malnutrition, severe (HCC) 11/02/2014  . Acute renal failure (HCC) 10/31/2014  . Hyperkalemia 10/31/2014    Orientation RESPIRATION BLADDER Height & Weight        Normal Incontinent Weight: 100 lb 8.5 oz (45.6 kg) Height:  5\' 3"  (160 cm)  BEHAVIORAL SYMPTOMS/MOOD NEUROLOGICAL BOWEL NUTRITION STATUS  (none) (none) Incontinent Diet(Heart Healthy )  AMBULATORY STATUS COMMUNICATION OF NEEDS Skin   Extensive Assist Non-Verbally Other (Comment)(Stage 3 on sacrum )                       Personal Care Assistance Level of Assistance  Bathing, Feeding, Dressing Bathing Assistance: Limited assistance Feeding assistance: Independent Dressing Assistance: Limited assistance     Functional Limitations Info  Sight, Hearing, Speech Sight Info: Adequate Hearing Info: Adequate Speech Info: Adequate    SPECIAL CARE FACTORS FREQUENCY                       Contractures Contractures Info:  Not present    Additional Factors Info  Code Status, Allergies Code Status Info: DNR Allergies Info: Sulfa Antibiotics            Current Medications (09/06/2017):  This is the current hospital active medication list Current Facility-Administered Medications  Medication Dose Route Frequency Provider Last Rate Last Dose  . 0.9 %  sodium chloride infusion   Intravenous Continuous Jamie Austin, MD 75 mL/hr at 09/06/17 0322    . acetaminophen (TYLENOL) tablet 650 mg  650 mg Oral Q6H PRN Jamie Austin, MD       Or  . acetaminophen (TYLENOL) suppository 650 mg  650 mg Rectal Q6H PRN Glass, Pavan, MD      . insulin aspart (novoLOG) injection 0-5 Units  0-5 Units Subcutaneous QHS Jamie Austin, MD   2 Units at 09/05/17 2150  . insulin aspart (novoLOG) injection 0-9 Units  0-9 Units Subcutaneous TID WC Glass, Pavan, MD      . OLANZapine (ZYPREXA) tablet 5 mg  5 mg Oral QHS Glass, Jamie Rota, MD   5 mg at 09/05/17 2153  . ondansetron (ZOFRAN) tablet 4 mg  4 mg Oral Q6H PRN Jamie Austin, MD       Or  . ondansetron (ZOFRAN) injection 4 mg  4 mg Intravenous Q6H PRN Glass, Pavan, MD      . pravastatin (PRAVACHOL) tablet 20 mg  20 mg Oral QHS Jamie Austin, MD   20 mg at 09/05/17 2152  .  senna-docusate (Senokot-S) tablet 1 tablet  1 tablet Oral QHS PRN Jamie AustinPyreddy, Pavan, MD         Discharge Medications: Please see discharge summary for a list of discharge medications.  Relevant Imaging Results:  Relevant Lab Results:   Additional Information    Jamie Glass  Jamie Glass, [address]

## 2017-09-06 NOTE — Progress Notes (Signed)
[employer] [facility] [place] [facility]                                                                                                                                                                                  Patient Demographics   Jamie Glass, is a 84 y.o. female, DOB - December 07, 1927, ZOX:[id number]  Admit date - 09/05/2017   Admitting Physician Ihor Austin, MD  Outpatient Primary MD for the patient is Gracelyn Nurse, MD   LOS - 1  Subjective: Patient unresponsive    Review of Systems:   CONSTITUTIONAL: Unresponsive  Vitals:   Vitals:   09/06/17 0527 09/06/17 1100 09/06/17 1200 09/06/17 1359  BP: (!) 169/75 (!) 174/86 (!) 169/68 (!) 163/114  Pulse: 84 82  88  Resp: 16 16  14   Temp: 99 F (37.2 C) 98.2 F (36.8 C)  98.8 F (37.1 C)  TempSrc: Oral Axillary  Oral  SpO2: 94% 96%  95%  Weight:      Height:        Wt Readings from Last 3 Encounters:  09/05/17 45.6 kg (100 lb 8.5 oz)  07/20/17 57.2 kg (126 lb)  01/05/17 59 kg (130 lb)     Intake/Output Summary (Last 24 hours) at 09/06/2017 1445 Last data filed at 09/06/2017 1425 Gross per 24 hour  Intake 577.5 ml  Output 700 ml  Net -122.5 ml    Physical Exam:   GENERAL: Critically ill unresponsive HEAD, EYES, EARS, NOSE AND THROAT: Atraumatic, normocephalic. Extraocular muscles are intact. Pupils equal and reactive to light. Sclerae anicteric. No conjunctival injection. No oro-pharyngeal erythema.  NECK: Supple. There is no jugular venous distention. No bruits, no lymphadenopathy, no thyromegaly.  HEART: Regular rate and rhythm,. No murmurs, no rubs, no clicks.  LUNGS: Clear to auscultation bilaterally. No rales or rhonchi. No wheezes.  ABDOMEN: Soft, flat, nontender, nondistended. Has good bowel sounds. No hepatosplenomegaly appreciated.  EXTREMITIES: No evidence of any cyanosis, clubbing, or peripheral edema.  +2 pedal and radial pulses bilaterally.  NEUROLOGIC: Unresponsive Psych: Not  anxious, depressed LN: No inguinal LN enlargement    Antibiotics   Anti-infectives (From admission, onward)   None      Medications   Scheduled Meds: . insulin aspart  0-5 Units Subcutaneous QHS  . insulin aspart  0-9 Units Subcutaneous TID WC  . OLANZapine  5 mg Oral QHS  . pravastatin  20 mg Oral QHS   Continuous Infusions: . sodium chloride 75 mL/hr at 09/06/17 0322   PRN Meds:.acetaminophen **OR** acetaminophen, ondansetron **OR** ondansetron (ZOFRAN) IV, senna-docusate   Data Review:   Micro Results Recent Results (from the past 240 hour(s))  MRSA  PCR Screening     Status: None   Collection Time: 09/05/17  6:13 PM  Result Value Ref Range Status   MRSA by PCR NEGATIVE NEGATIVE Final    Comment:        The GeneXpert MRSA Assay (FDA approved for NASAL specimens only), is one component of a comprehensive MRSA colonization surveillance program. It is not intended to diagnose MRSA infection nor to guide or monitor treatment for MRSA infections. Performed at [facility], [address], [place], [place] [address]     Radiology Reports Ct Head Wo Contrast  Result Date: 09/06/2017 CLINICAL DATA:  84 year old female post fall with intracranial hemorrhage. Subsequent encounter. EXAM: CT HEAD WITHOUT CONTRAST TECHNIQUE: Contiguous axial images were obtained from the base of the skull through the vertex without intravenous contrast. COMPARISON:  09/05/2017 and 07/16/2017 CT. FINDINGS: Brain: Increase in size of broad-based right-sided subdural hematoma best appreciated on sagittal imaging now with maximal thickness of 8.3 mm versus prior 5 mm. New from 07/16/2017 and appearing slightly more prominent than on 09/05/2017 exam is a cerebral spinal fluid density left convexity extra-axial collection which in the present clinical setting is suspicious for posttraumatic isodense subdural hematoma/hygroma. Maximal thickness 5 mm versus 3.5 mm on 09/06/2007. No significant  midline shift. New subarachnoid blood inferior aspect posterior right temporal-occipital lobe. Scattered left convexity subarachnoid blood greatest in the left sylvian fissure and posterior left frontal-parietal region without significant change. Small amount of subarachnoid blood posterior left temporal-occipital lobe without significant change. 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 Prominent chronic microvascular changes. No CT evidence of large acute thrombotic infarct. No intracranial mass lesion noted on this unenhanced exam. Vascular: Vascular calcifications. Skull: No skull fracture Sinuses/Orbits: No acute orbital abnormality. Visualized sinuses clear. Other: Mastoid air cells and middle ear cavities are clear. IMPRESSION: Increase in size of broad-based right-sided subdural hematoma (best appreciated on sagittal imaging) now with maximal thickness of 8.3 mm versus prior 5 mm. Cerebral spinal fluid density left convexity extra-axial collection suspicious for posttraumatic isodense subdural hematoma/hygroma. Maximal thickness 5 mm versus 3.5 mm on 09/06/2007. New subarachnoid blood inferior aspect posterior right temporal-occipital lobe. Scattered left convexity subarachnoid blood without significant change. These results will be called to the ordering clinician or representative by the Radiologist Assistant, and communication documented in the PACS or zVision Dashboard. Electronically Signed   By: Lacy Duverney M.D.   On: 09/06/2017 09:06   Ct Head Wo Contrast  Result Date: 09/05/2017 CLINICAL DATA:  Fall. EXAM: CT HEAD WITHOUT CONTRAST CT CERVICAL SPINE WITHOUT CONTRAST TECHNIQUE: Multidetector CT imaging of the head and cervical spine was performed following the standard protocol without intravenous contrast. Multiplanar CT image reconstructions of the cervical spine were also generated. COMPARISON:  Multiple prior head CTs, most recently dated Jul 16, 2017. MR  cervical spine dated December 27, 2013. FINDINGS: CT HEAD FINDINGS Brain: New right cerebral convexity subdural hematoma measuring up to 5 mm in maximal thickness. Small foci of subarachnoid hemorrhage along the right frontal lobe, the left frontoparietal region, and the left tentorium cerebelli. No midline shift. The basal cisterns are patent. No evidence of acute infarction, hydrocephalus, or mass lesion. Stable cerebral atrophy and chronic microvascular ischemic changes. Unchanged lacunar infarcts in the left basal ganglia, left thalamus, and both cerebellar hemispheres. Old infarcts in the right frontal and posterior parietal lobes again noted. Vascular: Calcified atherosclerosis at the skullbase. No hyperdense vessel. Skull: Negative for fracture or focal lesion. Sinuses/Orbits:  No acute finding. Other: None. CT CERVICAL SPINE FINDINGS Alignment: No traumatic malalignment. Skull base and vertebrae: No acute fracture. No primary bone lesion or focal pathologic process. Soft tissues and spinal canal: No prevertebral fluid or swelling. No visible canal hematoma. Disc levels: Moderate disc height loss and facet uncovertebral hypertrophy from C5-C6 through C7-T1. Moderate left greater than right facet arthropathy in the upper cervical spine. Moderate atlantodental degenerative changes, asymmetric to the right. Upper chest: Mild centrilobular emphysema. Other: 8 mm hypodense nodule in the right thyroid lobe. Bilateral carotid artery calcific atherosclerosis. IMPRESSION: 1. Small right cerebral convexity subdural hematoma measuring up to 5 mm in maximal thickness. No midline shift. 2. Scattered small foci of subarachnoid hemorrhage bilaterally. 3.  No acute cervical spine fracture. Critical Value/emergent results were called by telephone at the time of interpretation on 09/05/2017 at 3:10 pm to Dr. Jene Every , who verbally acknowledged these results. Electronically Signed   By: Obie Dredge M.D.   On: 09/05/2017  15:11   Ct Cervical Spine Wo Contrast  Result Date: 09/05/2017 CLINICAL DATA:  Fall. EXAM: CT HEAD WITHOUT CONTRAST CT CERVICAL SPINE WITHOUT CONTRAST TECHNIQUE: Multidetector CT imaging of the head and cervical spine was performed following the standard protocol without intravenous contrast. Multiplanar CT image reconstructions of the cervical spine were also generated. COMPARISON:  Multiple prior head CTs, most recently dated Jul 16, 2017. MR cervical spine dated December 27, 2013. FINDINGS: CT HEAD FINDINGS Brain: New right cerebral convexity subdural hematoma measuring up to 5 mm in maximal thickness. Small foci of subarachnoid hemorrhage along the right frontal lobe, the left frontoparietal region, and the left tentorium cerebelli. No midline shift. The basal cisterns are patent. No evidence of acute infarction, hydrocephalus, or mass lesion. Stable cerebral atrophy and chronic microvascular ischemic changes. Unchanged lacunar infarcts in the left basal ganglia, left thalamus, and both cerebellar hemispheres. Old infarcts in the right frontal and posterior parietal lobes again noted. Vascular: Calcified atherosclerosis at the skullbase. No hyperdense vessel. Skull: Negative for fracture or focal lesion. Sinuses/Orbits: No acute finding. Other: None. CT CERVICAL SPINE FINDINGS Alignment: No traumatic malalignment. Skull base and vertebrae: No acute fracture. No primary bone lesion or focal pathologic process. Soft tissues and spinal canal: No prevertebral fluid or swelling. No visible canal hematoma. Disc levels: Moderate disc height loss and facet uncovertebral hypertrophy from C5-C6 through C7-T1. Moderate left greater than right facet arthropathy in the upper cervical spine. Moderate atlantodental degenerative changes, asymmetric to the right. Upper chest: Mild centrilobular emphysema. Other: 8 mm hypodense nodule in the right thyroid lobe. Bilateral carotid artery calcific atherosclerosis. IMPRESSION: 1.  Small right cerebral convexity subdural hematoma measuring up to 5 mm in maximal thickness. No midline shift. 2. Scattered small foci of subarachnoid hemorrhage bilaterally. 3.  No acute cervical spine fracture. Critical Value/emergent results were called by telephone at the time of interpretation on 09/05/2017 at 3:10 pm to Dr. Jene Every , who verbally acknowledged these results. Electronically Signed   By: Obie Dredge M.D.   On: 09/05/2017 15:11     CBC Recent Labs  Lab 09/05/17 1405 09/06/17 0649  WBC 7.2 5.0  HGB 12.1 11.1*  HCT 37.3 34.2*  PLT 238 209  MCV 81.1 79.7*  MCH 26.2 25.9*  MCHC 32.3 32.5  RDW 18.0* 17.3*    Chemistries  Recent Labs  Lab 09/05/17 1405 09/06/17 0649  NA 138 140  K 4.1 3.7  CL 107 110  CO2 17* 21*  GLUCOSE  210* 126*  BUN 41* 29*  CREATININE 0.79 0.77  CALCIUM 8.7* 8.4*  AST 28  --   ALT 22  --   ALKPHOS 76  --   BILITOT 0.6  --    ------------------------------------------------------------------------------------------------------------------ estimated creatinine clearance is 35 mL/min (by C-G formula based on SCr of 0.77 mg/dL). ------------------------------------------------------------------------------------------------------------------ No results for input(s): HGBA1C in the last 72 hours. ------------------------------------------------------------------------------------------------------------------ No results for input(s): CHOL, HDL, LDLCALC, TRIG, CHOLHDL, LDLDIRECT in the last 72 hours. ------------------------------------------------------------------------------------------------------------------ No results for input(s): TSH, T4TOTAL, T3FREE, THYROIDAB in the last 72 hours.  Invalid input(s): FREET3 ------------------------------------------------------------------------------------------------------------------ No results for input(s): VITAMINB12, FOLATE, FERRITIN, TIBC, IRON, RETICCTPCT in the last 72  hours.  Coagulation profile No results for input(s): INR, PROTIME in the last 168 hours.  No results for input(s): DDIMER in the last 72 hours.  Cardiac Enzymes No results for input(s): CKMB, TROPONINI, MYOGLOBIN in the last 168 hours.  Invalid input(s): CK ------------------------------------------------------------------------------------------------------------------ Invalid input(s): POCBNP    Assessment & Plan   84 year old elderly female patient with history of dementia, hypertension, diabetes mellitus type 2, congestive heart failure, hypertension presented to the emergency room for fall and head injury.  -Subdural hematoma Repeat CT scan shows progression of the subdural hematoma I discussed the case with patient's DS worker Florencia Reasonshesah Carr, they have agreed to make patient comfort care she is unresponsive and with the progression of subdural hematoma patient's prognosis very poor  -Scattered subarachnoid hemorrhage Monitoring  -Dehydration Stop fluid  -Hypothermia Due to subdural hematoma    -We will initiate patient on comfort care measures only     Code Status Orders  (From admission, onward)        Start     Ordered   09/05/17 1717  Do not attempt resuscitation (DNR)  Continuous    Question Answer Comment  In the event of cardiac or respiratory ARREST Do not call a "code blue"   In the event of cardiac or respiratory ARREST Do not perform Intubation, CPR, defibrillation or ACLS   In the event of cardiac or respiratory ARREST Use medication by any route, position, wound care, and other measures to relive pain and suffering. May use oxygen, suction and manual treatment of airway obstruction as needed for comfort.      09/05/17 1716    Code Status History    Date Active Date Inactive Code Status Order ID Comments User Context   07/16/2017 1704 07/20/2017 2145 Full Code [id number]  Marguarite ArbourSparks, Jeffrey D, MD Inpatient   02/25/2016 1744 02/28/2016 1932 Full Code  [id number]  Adrian SaranMody, Sital, MD Inpatient   10/31/2014 2027 11/03/2014 1717 Full Code [id number]  Hower, Cletis Athensavid K, MD ED           Consults none  DVT Prophylaxis  scd's  Lab Results  Component Value Date   PLT 209 09/06/2017     Time Spent in minutes   35min Greater than 50% of time spent in care coordination and counseling patient regarding the condition and plan of care.   Auburn BilberryShreyang Ayelet Gruenewald M.D on 09/06/2017 at 2:45 PM  Between 7am to 6pm - Pager - [phone]  After 6pm go to [email or web address] - Social research officer, governmentpassword [email or web address]  [employer]   Office  [phone]

## 2017-09-07 MED ORDER — LORAZEPAM 1 MG PO TABS: 1.0000 mg | ORAL_TABLET | ORAL | 0 refills | Status: AC | PRN

## 2017-09-07 MED ORDER — MORPHINE SULFATE (CONCENTRATE) 10 MG/0.5ML PO SOLN: 5.0000 mg | ORAL | 0 refills | Status: AC | PRN

## 2017-09-07 MED ORDER — ACETAMINOPHEN 325 MG PO TABS: 650.0000 mg | ORAL_TABLET | Freq: Four times a day (QID) | ORAL | Status: AC | PRN

## 2017-09-07 NOTE — Progress Notes (Signed)
New referral for hospice [facility].  Patient plans [facility]. Initial consult was for inpatient hospice, however, guardian wants patient to go back to [facility] with hosp[facility] services.  Referral information gathered and faxed to referral intake.  No equipment needs. Thank you for allowing participation in this patient's care.  Norris CrossKara H. Marshall

## 2017-09-07 NOTE — Progress Notes (Signed)
Nutrition Brief Follow-Up Note  Full nutrition assessment completed on 7/2. Chart reviewed this AM. Patient has now transitioned to comfort care.   No further nutrition interventions warranted at this time. Please consult RD as needed.  Helane RimaLeanne Stacey Maura, MS, RD, LDN Office: [phone] Pager: [phone] After Hours/Weekend Pager: [phone]

## 2017-09-07 NOTE — Clinical Social Work Note (Signed)
Patient is medically ready for discharge today. CSW notified patient's guardian Saintclair HalstedRhesha Carr [phone]. Markham JordanRhesha is in agreement with patient discharging [facility] hosp[facility]. CSW also notified Diannia RuderKara, hospice liaison of discharge back to facility with hospice. CSW also notified Rick at [facility] of discharge today. Patient will be transported by EMS. RN to call report and call for transport.   Ruthe Mannanandace Therman Hughlett MSW, [address] [phone]

## 2017-09-07 NOTE — Progress Notes (Signed)
Called and gave report to Modesta Messingarol Valencia at [facility], EMS called

## 2017-09-07 NOTE — Discharge Summary (Signed)
[facility] - [place] at [facility], 84 y.o., DOB [birth year], MRN [id number]. Admission date: 09/05/2017 Discharge Date 09/07/2017 Primary MD Gracelyn Nurse, MD Admitting Physician Ihor Austin, MD  Admission Diagnosis  Subdural hematoma [facility]) [S06.5X9A] Hypothermia, initial encounter [T68.XXXA]  Discharge Diagnosis   Active Problems:   Subdural hematoma (HCC) Scattered subarachnoid hemorrhage Dehydration Hypothermia Anxiety Osteoarthritis History of CHF type unknown Dementia Diabetes type 2   Hospital Course  Jamie Glass  is a 84 y.o. female with a known history of dementia type 2 diabetes mellitus, hypertension, chronic kidney disease, congestive heart failure, arthritis he is a resident of [facility].  Patient had a witnessed fall and head injury.  She has a laceration over the right frontal region which was sutured in the emergency room.  Patient was worked up with CT head which showed 5 mm subdural hematoma.   Patient was admitted with the intention of comfort care measures.  Patient had a repeat CT scan which showed expanding of the subdural hematoma.  He was discussed further with the DSS guardian regarding making patient comfort care.  And this morning is more awake.  She will be discharged back to the place where she was living with hospice or palliative care team and following her     Hospice to follow her at the facility         Consults  None  Significant Tests:  See full reports for all details     Ct Head Wo Contrast  Result Date: 09/06/2017 CLINICAL DATA:  84 year old female post fall with intracranial hemorrhage. Subsequent encounter. EXAM: CT HEAD WITHOUT CONTRAST TECHNIQUE: Contiguous axial images were obtained from the base of the skull through the vertex without intravenous contrast. COMPARISON:  09/05/2017 and 07/16/2017 CT. FINDINGS: Brain: Increase in size of broad-based right-sided subdural hematoma best  appreciated on sagittal imaging now with maximal thickness of 8.3 mm versus prior 5 mm. New from 07/16/2017 and appearing slightly more prominent than on 09/05/2017 exam is a cerebral spinal fluid density left convexity extra-axial collection which in the present clinical setting is suspicious for posttraumatic isodense subdural hematoma/hygroma. Maximal thickness 5 mm versus 3.5 mm on 09/06/2007. No significant midline shift. New subarachnoid blood inferior aspect posterior right temporal-occipital lobe. Scattered left convexity subarachnoid blood greatest in the left sylvian fissure and posterior left frontal-parietal region without significant change. Small amount of subarachnoid blood posterior left temporal-occipital lobe without significant change. 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 Prominent chronic microvascular changes. No CT evidence of large acute thrombotic infarct. No intracranial mass lesion noted on this unenhanced exam. Vascular: Vascular calcifications. Skull: No skull fracture Sinuses/Orbits: No acute orbital abnormality. Visualized sinuses clear. Other: Mastoid air cells and middle ear cavities are clear. IMPRESSION: Increase in size of broad-based right-sided subdural hematoma (best appreciated on sagittal imaging) now with maximal thickness of 8.3 mm versus prior 5 mm. Cerebral spinal fluid density left convexity extra-axial collection suspicious for posttraumatic isodense subdural hematoma/hygroma. Maximal thickness 5 mm versus 3.5 mm on 09/06/2007. New subarachnoid blood inferior aspect posterior right temporal-occipital lobe. Scattered left convexity subarachnoid blood without significant change. These results will be called to the ordering clinician or representative by the Radiologist Assistant, and communication documented in the PACS or zVision Dashboard. Electronically Signed   By: Lacy Duverney M.D.   On: 09/06/2017 09:06   Ct Head Wo  Contrast  Result Date: 09/05/2017 CLINICAL DATA:  Fall. EXAM: CT HEAD  WITHOUT CONTRAST CT CERVICAL SPINE WITHOUT CONTRAST TECHNIQUE: Multidetector CT imaging of the head and cervical spine was performed following the standard protocol without intravenous contrast. Multiplanar CT image reconstructions of the cervical spine were also generated. COMPARISON:  Multiple prior head CTs, most recently dated Jul 16, 2017. MR cervical spine dated December 27, 2013. FINDINGS: CT HEAD FINDINGS Brain: New right cerebral convexity subdural hematoma measuring up to 5 mm in maximal thickness. Small foci of subarachnoid hemorrhage along the right frontal lobe, the left frontoparietal region, and the left tentorium cerebelli. No midline shift. The basal cisterns are patent. No evidence of acute infarction, hydrocephalus, or mass lesion. Stable cerebral atrophy and chronic microvascular ischemic changes. Unchanged lacunar infarcts in the left basal ganglia, left thalamus, and both cerebellar hemispheres. Old infarcts in the right frontal and posterior parietal lobes again noted. Vascular: Calcified atherosclerosis at the skullbase. No hyperdense vessel. Skull: Negative for fracture or focal lesion. Sinuses/Orbits: No acute finding. Other: None. CT CERVICAL SPINE FINDINGS Alignment: No traumatic malalignment. Skull base and vertebrae: No acute fracture. No primary bone lesion or focal pathologic process. Soft tissues and spinal canal: No prevertebral fluid or swelling. No visible canal hematoma. Disc levels: Moderate disc height loss and facet uncovertebral hypertrophy from C5-C6 through C7-T1. Moderate left greater than right facet arthropathy in the upper cervical spine. Moderate atlantodental degenerative changes, asymmetric to the right. Upper chest: Mild centrilobular emphysema. Other: 8 mm hypodense nodule in the right thyroid lobe. Bilateral carotid artery calcific atherosclerosis. IMPRESSION: 1. Small right cerebral convexity  subdural hematoma measuring up to 5 mm in maximal thickness. No midline shift. 2. Scattered small foci of subarachnoid hemorrhage bilaterally. 3.  No acute cervical spine fracture. Critical Value/emergent results were called by telephone at the time of interpretation on 09/05/2017 at 3:10 pm to Dr. Jene EveryOBERT KINNER , who verbally acknowledged these results. Electronically Signed   By: Obie DredgeWilliam T Derry M.D.   On: 09/05/2017 15:11   Ct Cervical Spine Wo Contrast  Result Date: 09/05/2017 CLINICAL DATA:  Fall. EXAM: CT HEAD WITHOUT CONTRAST CT CERVICAL SPINE WITHOUT CONTRAST TECHNIQUE: Multidetector CT imaging of the head and cervical spine was performed following the standard protocol without intravenous contrast. Multiplanar CT image reconstructions of the cervical spine were also generated. COMPARISON:  Multiple prior head CTs, most recently dated Jul 16, 2017. MR cervical spine dated December 27, 2013. FINDINGS: CT HEAD FINDINGS Brain: New right cerebral convexity subdural hematoma measuring up to 5 mm in maximal thickness. Small foci of subarachnoid hemorrhage along the right frontal lobe, the left frontoparietal region, and the left tentorium cerebelli. No midline shift. The basal cisterns are patent. No evidence of acute infarction, hydrocephalus, or mass lesion. Stable cerebral atrophy and chronic microvascular ischemic changes. Unchanged lacunar infarcts in the left basal ganglia, left thalamus, and both cerebellar hemispheres. Old infarcts in the right frontal and posterior parietal lobes again noted. Vascular: Calcified atherosclerosis at the skullbase. No hyperdense vessel. Skull: Negative for fracture or focal lesion. Sinuses/Orbits: No acute finding. Other: None. CT CERVICAL SPINE FINDINGS Alignment: No traumatic malalignment. Skull base and vertebrae: No acute fracture. No primary bone lesion or focal pathologic process. Soft tissues and spinal canal: No prevertebral fluid or swelling. No visible canal  hematoma. Disc levels: Moderate disc height loss and facet uncovertebral hypertrophy from C5-C6 through C7-T1. Moderate left greater than right facet arthropathy in the upper cervical spine. Moderate atlantodental degenerative changes, asymmetric to the right. Upper chest: Mild centrilobular emphysema. Other: 8 mm hypodense  nodule in the right thyroid lobe. Bilateral carotid artery calcific atherosclerosis. IMPRESSION: 1. Small right cerebral convexity subdural hematoma measuring up to 5 mm in maximal thickness. No midline shift. 2. Scattered small foci of subarachnoid hemorrhage bilaterally. 3.  No acute cervical spine fracture. Critical Value/emergent results were called by telephone at the time of interpretation on 09/05/2017 at 3:10 pm to Dr. Jene Every , who verbally acknowledged these results. Electronically Signed   By: Obie Dredge M.D.   On: 09/05/2017 15:11       Today   Subjective:   Jamie Glass patient eating breakfast Objective:   Blood pressure (!) 189/125, pulse 80, temperature 98.1 F (36.7 C), temperature source Oral, resp. rate 20, height 5\' 3"  (1.6 m), weight 45.6 kg (100 lb 8.5 oz), SpO2 94 %.  .  Intake/Output Summary (Last 24 hours) at 09/07/2017 1016 Last data filed at 09/07/2017 0342 Gross per 24 hour  Intake 0 ml  Output 700 ml  Net -700 ml    Exam VITAL SIGNS: Blood pressure (!) 189/125, pulse 80, temperature 98.1 F (36.7 C), temperature source Oral, resp. rate 20, height 5\' 3"  (1.6 m), weight 45.6 kg (100 lb 8.5 oz), SpO2 94 %.  GENERAL:  84 y.o.-year-old patient lying in the bed with no acute distress.  EYES: Pupils equal, round, reactive to light and accommodation. No scleral icterus. Extraocular muscles intact.  HEENT: Head atraumatic, normocephalic. Oropharynx and nasopharynx clear.  NECK:  Supple, no jugular venous distention. No thyroid enlargement, no tenderness.  LUNGS: Normal breath sounds bilaterally, no wheezing, rales,rhonchi or crepitation.  No use of accessory muscles of respiration.  CARDIOVASCULAR: S1, S2 normal. No murmurs, rubs, or gallops.  ABDOMEN: Soft, nontender, nondistended. Bowel sounds present. No organomegaly or mass.  EXTREMITIES: No pedal edema, cyanosis, or clubbing.  NEUROLOGIC: Confused PSYCHIATRIC: Confused SKIN: No obvious rash, lesion, or ulcer.   Data Review     CBC w Diff:  Lab Results  Component Value Date   WBC 5.0 09/06/2017   HGB 11.1 (L) 09/06/2017   HGB 11.9 (L) 11/26/2013   HCT 34.2 (L) 09/06/2017   HCT 38.0 11/26/2013   PLT 209 09/06/2017   PLT 204 11/26/2013   LYMPHOPCT 23 06/20/2017   LYMPHOPCT 32.5 04/29/2013   MONOPCT 8 06/20/2017   MONOPCT 11.7 04/29/2013   EOSPCT 1 06/20/2017   EOSPCT 1.4 04/29/2013   BASOPCT 1 06/20/2017   BASOPCT 1.0 04/29/2013   CMP:  Lab Results  Component Value Date   NA 140 09/06/2017   NA 140 11/26/2013   K 3.7 09/06/2017   K 4.1 11/26/2013   CL 110 09/06/2017   CL 106 11/26/2013   CO2 21 (L) 09/06/2017   CO2 26 11/26/2013   BUN 29 (H) 09/06/2017   BUN 26 (H) 11/26/2013   CREATININE 0.77 09/06/2017   CREATININE 1.22 11/26/2013   PROT 7.8 09/05/2017   PROT 7.2 11/26/2013   ALBUMIN 3.3 (L) 09/05/2017   ALBUMIN 3.5 11/26/2013   BILITOT 0.6 09/05/2017   BILITOT 0.5 11/26/2013   ALKPHOS 76 09/05/2017   ALKPHOS 77 11/26/2013   AST 28 09/05/2017   AST 28 11/26/2013   ALT 22 09/05/2017   ALT 11 (L) 11/26/2013  .  Micro Results Recent Results (from the past 240 hour(s))  MRSA PCR Screening     Status: None   Collection Time: 09/05/17  6:13 PM  Result Value Ref Range Status   MRSA by PCR NEGATIVE NEGATIVE Final  Comment:        The GeneXpert MRSA Assay (FDA approved for NASAL specimens only), is one component of a comprehensive MRSA colonization surveillance program. It is not intended to diagnose MRSA infection nor to guide or monitor treatment for MRSA infections. Performed at [facility], [address]  [place], [place] [address]         Code Status Orders  (From admission, onward)        Start     Ordered   09/06/17 1448  Do not attempt resuscitation (DNR)  Continuous    Question Answer Comment  In the event of cardiac or respiratory ARREST Do not call a "code blue"   In the event of cardiac or respiratory ARREST Do not perform Intubation, CPR, defibrillation or ACLS   In the event of cardiac or respiratory ARREST Use medication by any route, position, wound care, and other measures to relive pain and suffering. May use oxygen, suction and manual treatment of airway obstruction as needed for comfort.   Comments nurese may pronounce      09/06/17 1451    Code Status History    Date Active Date Inactive Code Status Order ID Comments User Context   09/05/2017 1717 09/06/2017 1451 DNR [id number]  Ihor Austin, MD Inpatient   07/16/2017 1704 07/20/2017 2145 Full Code [id number]  Marguarite Arbour, MD Inpatient   02/25/2016 1744 02/28/2016 1932 Full Code [id number]  Adrian Saran, MD Inpatient   10/31/2014 2027 11/03/2014 1717 Full Code [id number]  Hower, Cletis Athens, MD ED            Discharge Medications   Allergies as of 09/07/2017      Reactions   Sulfa Antibiotics Other (See Comments)   Unknown reaction- listed on MAR.      Medication List    STOP taking these medications   amLODipine 10 MG tablet Commonly known as:  NORVASC   aspirin EC 81 MG tablet   gabapentin 100 MG capsule Commonly known as:  NEURONTIN   insulin aspart 100 UNIT/ML injection Commonly known as:  novoLOG   isosorbide mononitrate 60 MG 24 hr tablet Commonly known as:  IMDUR   metoprolol tartrate 25 MG tablet Commonly known as:  LOPRESSOR   OLANZapine 5 MG tablet Commonly known as:  ZYPREXA   pravastatin 20 MG tablet Commonly known as:  PRAVACHOL     TAKE these medications   acetaminophen 325 MG tablet Commonly known as:  TYLENOL Take 2 tablets (650 mg total) by mouth every 6 (six) hours as needed  for mild pain (or Fever >/= 101).   LORazepam 1 MG tablet Commonly known as:  ATIVAN Take 1 tablet (1 mg total) by mouth every 4 (four) hours as needed for anxiety.   morphine CONCENTRATE 10 MG/0.5ML Soln concentrated solution Take 0.25 mLs (5 mg total) by mouth every 2 (two) hours as needed for moderate pain (or dyspnea).          Total Time in preparing paper work, data evaluation and todays exam - 35 minutes  Auburn Bilberry M.D on 09/07/2017 at 10:16 AM [facility]   Office  [phone]

## 2018-02-05 DEATH — deceased
